# Patient Record
Sex: Female | Born: 1945 | Race: Black or African American | Hispanic: No | Marital: Married | Smoking: Never smoker
Health system: Southern US, Community
[De-identification: ages and names within clinical notes are randomized; demographics above are authoritative.]

## PROBLEM LIST (undated history)

## (undated) ENCOUNTER — Emergency Department (HOSPITAL_COMMUNITY): Admission: EM | Payer: Medicare Other | Source: Home / Self Care

## (undated) DIAGNOSIS — I10 Essential (primary) hypertension: Secondary | ICD-10-CM

## (undated) DIAGNOSIS — I2510 Atherosclerotic heart disease of native coronary artery without angina pectoris: Secondary | ICD-10-CM

## (undated) DIAGNOSIS — E7800 Pure hypercholesterolemia, unspecified: Secondary | ICD-10-CM

## (undated) DIAGNOSIS — E785 Hyperlipidemia, unspecified: Secondary | ICD-10-CM

## (undated) DIAGNOSIS — K56609 Unspecified intestinal obstruction, unspecified as to partial versus complete obstruction: Secondary | ICD-10-CM

## (undated) HISTORY — PX: ROTATOR CUFF REPAIR: SHX139

## (undated) HISTORY — DX: Atherosclerotic heart disease of native coronary artery without angina pectoris: I25.10

## (undated) HISTORY — PX: TRIGGER FINGER RELEASE: SHX641

## (undated) HISTORY — PX: APPENDECTOMY: SHX54

## (undated) HISTORY — DX: Essential (primary) hypertension: I10

## (undated) HISTORY — DX: Hyperlipidemia, unspecified: E78.5

---

## 1998-01-11 ENCOUNTER — Other Ambulatory Visit: Admission: RE | Admit: 1998-01-11 | Discharge: 1998-01-11 | Payer: Self-pay | Admitting: Obstetrics and Gynecology

## 1998-12-07 ENCOUNTER — Other Ambulatory Visit: Admission: RE | Admit: 1998-12-07 | Discharge: 1998-12-07 | Payer: Self-pay | Admitting: General Surgery

## 1998-12-13 ENCOUNTER — Ambulatory Visit (HOSPITAL_BASED_OUTPATIENT_CLINIC_OR_DEPARTMENT_OTHER): Admission: RE | Admit: 1998-12-13 | Discharge: 1998-12-13 | Payer: Self-pay | Admitting: General Surgery

## 1999-06-30 ENCOUNTER — Encounter: Payer: Self-pay | Admitting: Family Medicine

## 1999-06-30 ENCOUNTER — Encounter: Admission: RE | Admit: 1999-06-30 | Discharge: 1999-06-30 | Payer: Self-pay | Admitting: Family Medicine

## 2000-07-12 ENCOUNTER — Encounter: Admission: RE | Admit: 2000-07-12 | Discharge: 2000-07-12 | Payer: Self-pay | Admitting: Family Medicine

## 2000-07-12 ENCOUNTER — Encounter: Payer: Self-pay | Admitting: Family Medicine

## 2001-08-22 ENCOUNTER — Encounter: Admission: RE | Admit: 2001-08-22 | Discharge: 2001-08-22 | Payer: Self-pay | Admitting: Family Medicine

## 2001-08-22 ENCOUNTER — Encounter: Payer: Self-pay | Admitting: Family Medicine

## 2002-03-13 ENCOUNTER — Ambulatory Visit (HOSPITAL_COMMUNITY): Admission: RE | Admit: 2002-03-13 | Discharge: 2002-03-13 | Payer: Self-pay | Admitting: Gastroenterology

## 2002-08-28 ENCOUNTER — Encounter: Admission: RE | Admit: 2002-08-28 | Discharge: 2002-08-28 | Payer: Self-pay | Admitting: Family Medicine

## 2002-08-28 ENCOUNTER — Encounter: Payer: Self-pay | Admitting: Family Medicine

## 2003-02-16 ENCOUNTER — Other Ambulatory Visit: Admission: RE | Admit: 2003-02-16 | Discharge: 2003-02-16 | Payer: Self-pay | Admitting: Obstetrics and Gynecology

## 2003-09-24 ENCOUNTER — Encounter: Admission: RE | Admit: 2003-09-24 | Discharge: 2003-09-24 | Payer: Self-pay | Admitting: Family Medicine

## 2004-03-10 ENCOUNTER — Encounter: Admission: RE | Admit: 2004-03-10 | Discharge: 2004-03-10 | Payer: Self-pay | Admitting: Obstetrics and Gynecology

## 2004-06-07 ENCOUNTER — Observation Stay (HOSPITAL_COMMUNITY): Admission: RE | Admit: 2004-06-07 | Discharge: 2004-06-08 | Payer: Self-pay | Admitting: Specialist

## 2004-09-29 ENCOUNTER — Encounter: Admission: RE | Admit: 2004-09-29 | Discharge: 2004-09-29 | Payer: Self-pay | Admitting: Obstetrics and Gynecology

## 2005-10-05 ENCOUNTER — Encounter: Admission: RE | Admit: 2005-10-05 | Discharge: 2005-10-05 | Payer: Self-pay | Admitting: Family Medicine

## 2006-10-08 ENCOUNTER — Encounter: Admission: RE | Admit: 2006-10-08 | Discharge: 2006-10-08 | Payer: Self-pay | Admitting: Family Medicine

## 2006-10-10 ENCOUNTER — Encounter: Admission: RE | Admit: 2006-10-10 | Discharge: 2006-10-10 | Payer: Self-pay | Admitting: Family Medicine

## 2007-10-16 ENCOUNTER — Encounter: Admission: RE | Admit: 2007-10-16 | Discharge: 2007-10-16 | Payer: Self-pay | Admitting: Family Medicine

## 2008-04-18 ENCOUNTER — Emergency Department (HOSPITAL_COMMUNITY): Admission: EM | Admit: 2008-04-18 | Discharge: 2008-04-19 | Payer: Self-pay | Admitting: Emergency Medicine

## 2008-10-18 ENCOUNTER — Encounter: Admission: RE | Admit: 2008-10-18 | Discharge: 2008-10-18 | Payer: Self-pay | Admitting: Family Medicine

## 2009-07-01 ENCOUNTER — Ambulatory Visit (HOSPITAL_COMMUNITY): Admission: RE | Admit: 2009-07-01 | Discharge: 2009-07-02 | Payer: Self-pay | Admitting: Specialist

## 2009-09-19 ENCOUNTER — Ambulatory Visit (HOSPITAL_COMMUNITY): Admission: RE | Admit: 2009-09-19 | Discharge: 2009-09-20 | Payer: Self-pay | Admitting: Specialist

## 2009-11-24 ENCOUNTER — Encounter: Admission: RE | Admit: 2009-11-24 | Discharge: 2009-11-24 | Payer: Self-pay | Admitting: Family Medicine

## 2010-09-03 LAB — BASIC METABOLIC PANEL
BUN: 10 mg/dL (ref 6–23)
Chloride: 107 mEq/L (ref 96–112)
Creatinine, Ser: 0.6 mg/dL (ref 0.4–1.2)
GFR calc Af Amer: >60 mL/min (ref >60)
GFR calc non Af Amer: >60 mL/min (ref >60)
Glucose, Bld: 84 mg/dL (ref 70–99)
Potassium: 4.7 mEq/L (ref 3.5–5.1)
Sodium: 143 mEq/L (ref 135–145)

## 2010-09-03 LAB — NO BLOOD PRODUCTS

## 2010-09-03 LAB — CBC
HCT: 40.2 % (ref 36.0–46.0)
Platelets: 294 10*3/uL (ref 150–400)

## 2010-09-06 LAB — BASIC METABOLIC PANEL
Calcium: 9.8 mg/dL (ref 8.4–10.5)
Chloride: 107 mEq/L (ref 96–112)
Creatinine, Ser: 0.74 mg/dL (ref 0.4–1.2)
GFR calc Af Amer: >60 mL/min (ref >60)
GFR calc non Af Amer: >60 mL/min (ref >60)
Sodium: 143 mEq/L (ref 135–145)

## 2010-09-06 LAB — CBC
HCT: 40.2 % (ref 36.0–46.0)
Hemoglobin: 13.1 g/dL (ref 12.0–15.0)
MCHC: 32.5 g/dL (ref 30.0–36.0)
RBC: 4.28 MIL/uL (ref 3.87–5.11)
RDW: 12.8 % (ref 11.5–15.5)

## 2010-11-03 NOTE — Op Note (Signed)
NAMELASYA, VETTER             ACCOUNT NO.:  [id number]   MEDICAL RECORD NO.:  [id number]          PATIENT TYPE:  AMB   LOCATION:  DAY                          FACILITY:  [facility]   PHYSICIAN:  Jene Every, M.D.    DATE OF BIRTH:  August 13, 1945   DATE OF PROCEDURE:  06/07/2004  DATE OF DISCHARGE:                                 OPERATIVE REPORT   PREOPERATIVE DIAGNOSES:  Rotator cuff tear, impingement syndrome, left  shoulder.   POSTOPERATIVE DIAGNOSES:  Rotator cuff tear, impingement syndrome, left  shoulder.   PROCEDURE:  Open subacromial decompression, acromioplasty, bursectomy,  rotator cuff repair utilizing Mitek suture anchors and parachute anchor.   ANESTHESIA:  General.   ASSISTANT:  Roma Schanz, P.A.   BRIEF HISTORY:  A 65 year old with refractory shoulder pain, MRI indicating  full thickness and proximal traction of the rotator cuff.  Operative  intervention was indicated for open repair. The risks and benefits were  discussed including bleeding, infection, injury to neurovascular structures,  suboptimal range of motion, recurrent tear, need for postoperative  mobilization, etc.   TECHNIQUE:  The patient supine in beach chair position after an adequate  level of general anesthesia and 1 g of Kefzol, the left shoulder and upper  extremity was prepped and draped in the usual sterile fashion.  An incision  was made over the anterior aspect of the acromion and Langer's line about 2  cm in length. The subcutaneous tissue was dissected, electrocautery was  utilized to achieve hemostasis.  The raphe between the anterior and lateral  heads of the deltoid was identified and divided and subperiosteally elevated  from the anterior aspect of the acromion. The high speed bur was then  utilized to perform an acromioplasty of the anterior edge with protection of  the deltoid at all times.  CA ligament resection and bursectomy was  performed.  There was a tear more prominent  than noted on the MRI with a  full-thickness tear of the rotator cuff with retraction over approximately a  centimeter.  I diverted the edges, there was some complex tearing in the  cement portion.  I digitally mobilized the subacromial space performing a  bursectomy and lysed all adhesions. I then mobilized the cuff and it was  found adequately mobilized to the greater tuberosity without difficulty.  With a rongeur, I performed a cancellous bed and prepared the cancellous  bed. I then took two Mitek suture anchors and impacted near the greater  tuberosity and excellent bone with no pullout and then threaded them through  the rotator cuff through a good portion of the cuff.  I advanced the cuff  and I held the cuff down with an awl while I tied the suture over that  securing the cuff.  To deliver the cuff into the trough, I then utilized a  parachute anchor, tapping it into the bone near the greater tuberosity and  advancing it with excellent purchase and delivery of the rotator cuff into  the trough.  Good range without retraction on the rotator cuff. Full  coverage was noted, excellent repair was appreciated. The  wound was  copiously irrigated with antibiotic irrigation. I then repaired the raphe  with #1 Vicryl and interrupted figure-of-eight sutures. Excellent purchase  was obtained. We did not detach the deltoid.  Subcutaneous tissue  reapproximated with 2-0 Vicryl simple sutures, the skin was reapproximated  with 4-0 subcuticular Prolene. Steri-Strips were applied, a sterile dressing  was applied. She was  placed in an abduction pillow, extubated without difficulty and transported  to the recovery room in satisfactory condition.   The patient tolerated the procedure well with no complications.     Trey Paula   JB/MEDQ  D:  06/07/2004  T:  06/07/2004  Job:  [id number]

## 2010-11-03 NOTE — Op Note (Signed)
   [name], Kelly Harrington                         ACCOUNT NO.:  [id number]   MEDICAL RECORD NO.:  [id number]                   PATIENT TYPE:  AMB   LOCATION:  ENDO                                 FACILITY:  [facility]   PHYSICIAN:  John C. Madilyn Fireman, M.D.                 DATE OF BIRTH:  1945/09/28   DATE OF PROCEDURE:  03/13/2002  DATE OF DISCHARGE:                                 OPERATIVE REPORT   PROCEDURE:  Colonoscopy.   INDICATION FOR PROCEDURE:  Screening colonoscopy in a 65 year old patient  with no prior colon screening.   DESCRIPTION OF PROCEDURE:  The patient was placed in the left lateral  decubitus position and placed on the pulse monitor with continuous low-flow  oxygen delivered by nasal cannula.  She was sedated with 50 mg IV Demerol  and 7 mg IV Versed.  The Olympus video colonoscope was inserted into the  rectum and advanced to the cecum, confirmed by transillumination at  McBurney's point and visualization of the ileocecal valve and appendiceal  orifice.  The prep was excellent.  The cecum, ascending, transverse,  descending, and sigmoid colon all appeared normal with no masses, polyps,  diverticula, or other mucosal abnormalities.  The rectum likewise appeared  normal, and retroflexed view of the anus revealed no obvious internal  hemorrhoids.  The colonoscope was then withdrawn, and the patient returned  to the recovery room in stable condition.  She tolerated the procedure well,  and there were no immediate complications.   IMPRESSION:  Normal colonoscopy.   PLAN:  Flexible sigmoidoscopy in five years and consideration of repeat  colonoscopy in 10 years.                                               John C. Madilyn Fireman, M.D.    JCH/MEDQ  D:  03/13/2002  T:  03/13/2002  Job:  [id number]   cc:   Raynelle Dick, M.D.  [address]  [place]  [place] [address]  Fax: [phone]

## 2011-03-20 LAB — URINALYSIS, ROUTINE W REFLEX MICROSCOPIC
Hgb urine dipstick: NEGATIVE
Ketones, ur: NEGATIVE
Leukocytes, UA: NEGATIVE
Nitrite: NEGATIVE
Protein, ur: 30 — AB
Urobilinogen, UA: 0.2
pH: 8

## 2011-03-20 LAB — POCT I-STAT, CHEM 8
BUN: 6
HCT: 43
Hemoglobin: 14.6

## 2011-03-20 LAB — URINE MICROSCOPIC-ADD ON

## 2012-12-03 DIAGNOSIS — J329 Chronic sinusitis, unspecified: Secondary | ICD-10-CM | POA: Diagnosis not present

## 2012-12-18 DIAGNOSIS — Z1231 Encounter for screening mammogram for malignant neoplasm of breast: Secondary | ICD-10-CM | POA: Diagnosis not present

## 2012-12-25 DIAGNOSIS — E7800 Pure hypercholesterolemia, unspecified: Secondary | ICD-10-CM | POA: Diagnosis not present

## 2012-12-25 DIAGNOSIS — Z0000 Encounter for general adult medical examination without abnormal findings: Secondary | ICD-10-CM | POA: Diagnosis not present

## 2012-12-25 DIAGNOSIS — J309 Allergic rhinitis, unspecified: Secondary | ICD-10-CM | POA: Diagnosis not present

## 2012-12-25 DIAGNOSIS — Z1211 Encounter for screening for malignant neoplasm of colon: Secondary | ICD-10-CM | POA: Diagnosis not present

## 2012-12-25 DIAGNOSIS — Z23 Encounter for immunization: Secondary | ICD-10-CM | POA: Diagnosis not present

## 2012-12-25 DIAGNOSIS — Z79899 Other long term (current) drug therapy: Secondary | ICD-10-CM | POA: Diagnosis not present

## 2012-12-25 DIAGNOSIS — I10 Essential (primary) hypertension: Secondary | ICD-10-CM | POA: Diagnosis not present

## 2013-02-02 DIAGNOSIS — M6530 Trigger finger, unspecified finger: Secondary | ICD-10-CM | POA: Diagnosis not present

## 2013-03-02 DIAGNOSIS — M6530 Trigger finger, unspecified finger: Secondary | ICD-10-CM | POA: Diagnosis not present

## 2013-03-13 DIAGNOSIS — Z1211 Encounter for screening for malignant neoplasm of colon: Secondary | ICD-10-CM | POA: Diagnosis not present

## 2013-06-09 DIAGNOSIS — M24549 Contracture, unspecified hand: Secondary | ICD-10-CM | POA: Diagnosis not present

## 2013-06-09 DIAGNOSIS — M6530 Trigger finger, unspecified finger: Secondary | ICD-10-CM | POA: Diagnosis not present

## 2013-06-22 DIAGNOSIS — M6530 Trigger finger, unspecified finger: Secondary | ICD-10-CM | POA: Diagnosis not present

## 2013-06-30 DIAGNOSIS — I10 Essential (primary) hypertension: Secondary | ICD-10-CM | POA: Diagnosis not present

## 2013-06-30 DIAGNOSIS — M79609 Pain in unspecified limb: Secondary | ICD-10-CM | POA: Diagnosis not present

## 2013-06-30 DIAGNOSIS — E7800 Pure hypercholesterolemia, unspecified: Secondary | ICD-10-CM | POA: Diagnosis not present

## 2013-06-30 DIAGNOSIS — Z79899 Other long term (current) drug therapy: Secondary | ICD-10-CM | POA: Diagnosis not present

## 2013-07-09 DIAGNOSIS — M6530 Trigger finger, unspecified finger: Secondary | ICD-10-CM | POA: Diagnosis not present

## 2013-07-20 DIAGNOSIS — M6530 Trigger finger, unspecified finger: Secondary | ICD-10-CM | POA: Diagnosis not present

## 2013-07-23 DIAGNOSIS — M6530 Trigger finger, unspecified finger: Secondary | ICD-10-CM | POA: Diagnosis not present

## 2013-12-21 DIAGNOSIS — Z1231 Encounter for screening mammogram for malignant neoplasm of breast: Secondary | ICD-10-CM | POA: Diagnosis not present

## 2014-01-07 DIAGNOSIS — E7800 Pure hypercholesterolemia, unspecified: Secondary | ICD-10-CM | POA: Diagnosis not present

## 2014-01-07 DIAGNOSIS — I10 Essential (primary) hypertension: Secondary | ICD-10-CM | POA: Diagnosis not present

## 2014-01-07 DIAGNOSIS — M79609 Pain in unspecified limb: Secondary | ICD-10-CM | POA: Diagnosis not present

## 2014-02-25 DIAGNOSIS — Z01419 Encounter for gynecological examination (general) (routine) without abnormal findings: Secondary | ICD-10-CM | POA: Diagnosis not present

## 2014-02-25 DIAGNOSIS — Z124 Encounter for screening for malignant neoplasm of cervix: Secondary | ICD-10-CM | POA: Diagnosis not present

## 2014-05-19 DIAGNOSIS — M65342 Trigger finger, left ring finger: Secondary | ICD-10-CM | POA: Diagnosis not present

## 2014-11-25 DIAGNOSIS — L509 Urticaria, unspecified: Secondary | ICD-10-CM | POA: Diagnosis not present

## 2014-12-23 DIAGNOSIS — Z1231 Encounter for screening mammogram for malignant neoplasm of breast: Secondary | ICD-10-CM | POA: Diagnosis not present

## 2014-12-31 DIAGNOSIS — L309 Dermatitis, unspecified: Secondary | ICD-10-CM | POA: Diagnosis not present

## 2014-12-31 DIAGNOSIS — L503 Dermatographic urticaria: Secondary | ICD-10-CM | POA: Diagnosis not present

## 2014-12-31 DIAGNOSIS — L810 Postinflammatory hyperpigmentation: Secondary | ICD-10-CM | POA: Diagnosis not present

## 2015-01-20 DIAGNOSIS — Z0000 Encounter for general adult medical examination without abnormal findings: Secondary | ICD-10-CM | POA: Diagnosis not present

## 2015-01-20 DIAGNOSIS — E780 Pure hypercholesterolemia: Secondary | ICD-10-CM | POA: Diagnosis not present

## 2015-01-20 DIAGNOSIS — I10 Essential (primary) hypertension: Secondary | ICD-10-CM | POA: Diagnosis not present

## 2015-01-20 DIAGNOSIS — R002 Palpitations: Secondary | ICD-10-CM | POA: Diagnosis not present

## 2015-01-20 DIAGNOSIS — L509 Urticaria, unspecified: Secondary | ICD-10-CM | POA: Diagnosis not present

## 2015-03-03 DIAGNOSIS — L503 Dermatographic urticaria: Secondary | ICD-10-CM | POA: Diagnosis not present

## 2015-03-03 DIAGNOSIS — L309 Dermatitis, unspecified: Secondary | ICD-10-CM | POA: Diagnosis not present

## 2017-04-17 ENCOUNTER — Other Ambulatory Visit: Payer: Self-pay | Admitting: Family Medicine

## 2017-04-17 ENCOUNTER — Other Ambulatory Visit (HOSPITAL_COMMUNITY)
Admission: RE | Admit: 2017-04-17 | Discharge: 2017-04-17 | Disposition: A | Discharge status: Home / Self Care | Payer: Medicare Other | Source: Ambulatory Visit | Attending: Family Medicine | Admitting: Family Medicine

## 2017-04-17 DIAGNOSIS — Z124 Encounter for screening for malignant neoplasm of cervix: Secondary | ICD-10-CM | POA: Diagnosis present

## 2017-04-19 LAB — CYTOLOGY - PAP: Diagnosis: NEGATIVE

## 2017-06-27 ENCOUNTER — Other Ambulatory Visit: Payer: Self-pay

## 2017-06-27 ENCOUNTER — Encounter (HOSPITAL_BASED_OUTPATIENT_CLINIC_OR_DEPARTMENT_OTHER): Payer: Self-pay | Admitting: *Deleted

## 2017-06-27 ENCOUNTER — Emergency Department (HOSPITAL_BASED_OUTPATIENT_CLINIC_OR_DEPARTMENT_OTHER)
Admission: EM | Admit: 2017-06-27 | Discharge: 2017-06-27 | Disposition: A | Discharge status: Home / Self Care | Payer: Medicare Other | Attending: Emergency Medicine | Admitting: Emergency Medicine

## 2017-06-27 DIAGNOSIS — H3321 Serous retinal detachment, right eye: Secondary | ICD-10-CM | POA: Insufficient documentation

## 2017-06-27 DIAGNOSIS — H5789 Other specified disorders of eye and adnexa: Secondary | ICD-10-CM | POA: Diagnosis present

## 2017-06-27 DIAGNOSIS — Z79899 Other long term (current) drug therapy: Secondary | ICD-10-CM | POA: Diagnosis not present

## 2017-06-27 HISTORY — DX: Pure hypercholesterolemia, unspecified: E78.00

## 2017-06-27 NOTE — ED Triage Notes (Signed)
Yesterday she started having floater in her right eye. Today it is in both eyes.

## 2017-06-27 NOTE — ED Notes (Signed)
Pt discharged to home with family. NAD.  

## 2017-06-27 NOTE — Discharge Instructions (Signed)
Follow-up with the ophthalmologist in his office at 9 AM.  Concern is for possible retinal detachment.

## 2017-06-27 NOTE — ED Provider Notes (Signed)
[facility] DEPARTMENT Provider Note   CSN: [id number] Arrival date & time: 06/27/17  2001     History   Chief Complaint No chief complaint on file.   HPI Kelly Harrington is a 72 y.o. female.  Patient yesterday started to have a floater visual sensation in her right eye.  Today she got a feeling of a single line of broken glass.  Patient does wear glasses does not wear contacts.  Patient does have an optometrist but not an ophthalmologist.  Triage said today it is in both eyes but patient states is just right eye.  Patient has a history of high cholesterol does not have a history of hypertension but blood pressures have been elevated today patient does not have a history of diabetes.  Patient has not had any significant problems with arise in the past.  No eye pain.  No significant visual changes      Past Medical History:  Diagnosis Date  . High cholesterol     There are no active problems to display for this patient.   Past Surgical History:  Procedure Laterality Date  . ROTATOR CUFF REPAIR    . TRIGGER FINGER RELEASE      OB History    No data available       Home Medications    Prior to Admission medications   Medication Sig Start Date End Date Taking? Authorizing Provider  atorvastatin (LIPITOR) 40 MG tablet Take 40 mg by mouth daily.   Yes [provider]    Family History No family history on file.  Social History Social History   Tobacco Use  . Smoking status: Never Smoker  . Smokeless tobacco: Never Used  Substance Use Topics  . Alcohol use: No    Frequency: Never  . Drug use: No     Allergies   Patient has no allergy information on record.   Review of Systems Review of Systems  Constitutional: Negative for fever.  HENT: Negative for congestion.   Eyes: Negative for photophobia, pain, discharge and redness.  Respiratory: Negative for shortness of breath.   Cardiovascular: Negative for chest pain.    Gastrointestinal: Negative for abdominal pain.  Genitourinary: Negative for dysuria.  Musculoskeletal: Negative for back pain.  Skin: Negative for rash.  Neurological: Negative for headaches.  Hematological: Does not bruise/bleed easily.  Psychiatric/Behavioral: Negative for confusion.     Physical Exam Updated Vital Signs BP (!) 174/93 (BP Location: Left Arm) Comment: Simultaneous filing. User may not have seen previous data.  Pulse (!) 104 Comment: Simultaneous filing. User may not have seen previous data.  Temp 98.6 F (37 C) (Oral)   Resp 20 Comment: Simultaneous filing. User may not have seen previous data.  Ht 1.549 m (5\' 1" )   Wt 50.3 kg (111 lb)   SpO2 99% Comment: Simultaneous filing. User may not have seen previous data.  BMI 20.97 kg/m   Physical Exam  Constitutional: She is oriented to person, place, and time. She appears well-developed and well-nourished. No distress.  HENT:  Head: Normocephalic and atraumatic.  Mouth/Throat: Oropharynx is clear and moist.  Eyes: Conjunctivae and EOM are normal. Pupils are equal, round, and reactive to light. Right eye exhibits no discharge. Left eye exhibits no discharge. No scleral icterus.  Anterior chamber normal in both eyes.  Visual fields normal in comparison to the left eye and the right eye.  What part of the retina can be visualized appears normal.  But limited.  Neck: Normal range of motion. Neck supple.  Cardiovascular: Normal rate, regular rhythm and normal heart sounds.  Pulmonary/Chest: Effort normal and breath sounds normal.  Abdominal: Soft. Bowel sounds are normal. There is no tenderness.  Musculoskeletal: Normal range of motion. She exhibits no edema.  Neurological: She is alert and oriented to person, place, and time. No cranial nerve deficit or sensory deficit. She exhibits normal muscle tone. Coordination normal.  Skin: Skin is warm. No rash noted.  Nursing note and vitals reviewed.    ED Treatments /  Results  Labs (all labs ordered are listed, but only abnormal results are displayed) Labs Reviewed - No data to display  EKG  EKG Interpretation None       Radiology No results found.  Procedures Procedures (including critical care time)  Medications Ordered in ED Medications - No data to display   Initial Impression / Assessment and Plan / ED Course  I have reviewed the triage vital signs and the nursing notes.  Pertinent labs & imaging results that were available during my care of the patient were reviewed by me and considered in my medical decision making (see chart for details).    Symptoms concerning for possible retinal detachment.  Or vitreous detachment.  Discussed with Dr. Carolynn Sayers ophthalmology.  He will see her in the office at 9 in the morning.  Patient's blood pressure is elevated she will need follow-up for that.  Patient has not had a history of hypertension in the past.  Patient nontoxic no acute distress.  Eye exam without any significant findings.  But limited.   Final Clinical Impressions(s) / ED Diagnoses   Final diagnoses:  Right retinal detachment    ED Discharge Orders    None       Fredia Sorrow, MD 06/27/17 2311

## 2018-08-14 ENCOUNTER — Ambulatory Visit: Payer: Medicare Other | Admitting: Podiatry

## 2018-08-14 ENCOUNTER — Encounter: Payer: Self-pay | Admitting: Podiatry

## 2018-08-14 VITALS — BP 140/88 | HR 69 | Temp 99.4°F | Resp 14

## 2018-08-14 DIAGNOSIS — B351 Tinea unguium: Secondary | ICD-10-CM | POA: Diagnosis not present

## 2018-08-14 DIAGNOSIS — L603 Nail dystrophy: Secondary | ICD-10-CM

## 2018-08-14 NOTE — Progress Notes (Signed)
   Subjective:    Patient ID: Kelly Harrington, female    DOB: 08/12/1945, 73 y.o.   MRN: [id number]  HPI 73 year old female presents the office today for concerns of toenail issue.  She states that her left third and right third toenails are starting to crack it looks like they can come off.  She states that her big toenails have become previously.  She denies any pain in the nails and she denies any redness or drainage or any swelling and no pain of the nails.  She said that she does workout a lot she is not sure if that is contributing to the symptoms.  She said no recent treatment.  No other concerns.   Review of Systems  All other systems reviewed and are negative.  Past Medical History:  Diagnosis Date  . High cholesterol     Past Surgical History:  Procedure Laterality Date  . ROTATOR CUFF REPAIR    . TRIGGER FINGER RELEASE       Current Outpatient Medications:  .  atorvastatin (LIPITOR) 40 MG tablet, Take 40 mg by mouth daily., Disp: , Rfl:  .  atorvastatin (LIPITOR) 40 MG tablet, atorvastatin 40 mg tablet   1 tablet 3 times a week by oral route., Disp: , Rfl:  .  ibuprofen (ADVIL,MOTRIN) 200 MG tablet, ibuprofen 200 mg tablet  Take 1 tablet every 6 hours by oral route., Disp: , Rfl:   Allergies  Allergen Reactions  . Codeine          Objective:   Physical Exam  General: AAO x3, NAD  Dermatological: Overall the toenails appear to be mildly hypertrophic, dystrophic with yellow and brown discoloration of the nails.  Bilateral third toenails are cracking towards the base of the nails but they are firmly here to the nail beds.  There is some dystrophy and discoloration with ridging in the right hallux toenail as well.  There is no edema, erythema, increased warmth and there is no signs of infection.  No open lesions.  Vascular: Dorsalis Pedis artery and Posterior Tibial artery pedal pulses are 2/4 bilateral with immedate capillary fill time. There is no pain with calf  compression, swelling, warmth, erythema.   Neruologic: Grossly intact via light touch bilateral.  Protective threshold with Semmes Wienstein monofilament intact to all pedal sites bilateral.   Musculoskeletal: No gross boney pedal deformities bilateral. No pain, crepitus, or limitation noted with foot and ankle range of motion bilateral. Muscular strength 5/5 in all groups tested bilateral.  Gait: Unassisted, Nonantalgic.      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  For now we discussed a biotin supplement as well as tea tree oil to the nails.  We discussed treatment options in case of fungus will likely do a topical compound through [facility] if it comes back positive for fungus.  Trula Slade DPM

## 2018-08-14 NOTE — Patient Instructions (Signed)
Look at starting a biotin supplement to help with the toenails. Also you can apply tea tree oil for now. I will call you once I get the results back on the culture. If you don't hear from [place] in about 2 weeks, please let me know.   If was nice to meet you today. If you have any questions or any further concerns, please feel fee to give me a call. You can call our office at [phone] or please feel fee to send me a message through [place].

## 2018-08-14 NOTE — Addendum Note (Signed)
Addended by: [place] Mon R on: 08/14/2018 12:55 PM   Modules accepted: Orders

## 2018-08-27 ENCOUNTER — Telehealth: Payer: Self-pay | Admitting: *Deleted

## 2018-08-27 NOTE — Telephone Encounter (Signed)
-----   Message from Trula Slade, DPM sent at 08/27/2018 12:31 PM EDT ----- Val- please let her know that the culture did not show fungus. I would continue with biotin supplement, and we can use urea cream. If still concern for fungus we can do the topical through [facility] that has antifungal and urea in it. Thanks.

## 2018-08-27 NOTE — Telephone Encounter (Signed)
I informed pt of Dr. Leigh Aurora review of results and orders, and explained the Revitaderm40 cost $22.00, and daily use to soften toenails for easier trimming, filling and thinning for a more normal appearance. Pt states she would like the Revitaderm40.

## 2019-08-29 ENCOUNTER — Ambulatory Visit: Payer: Medicare Other | Attending: Internal Medicine

## 2019-08-29 DIAGNOSIS — Z23 Encounter for immunization: Secondary | ICD-10-CM

## 2019-08-29 NOTE — Progress Notes (Signed)
   Covid-19 Vaccination Clinic  Name:  Kelly Harrington    MRN: [id number] DOB: 03/22/1946  08/29/2019  Ms. Kelly Harrington was observed post Covid-19 immunization for 15 minutes without incident. She was provided with Vaccine Information Sheet and instruction to access the V-Safe system.   Ms. Kelly Harrington was instructed to call 911 with any severe reactions post vaccine: Marland Kitchen Difficulty breathing  . Swelling of face and throat  . A fast heartbeat  . A bad rash all over body  . Dizziness and weakness   Immunizations Administered    Name Date Dose VIS Date Route   Pfizer COVID-19 Vaccine 08/29/2019  1:50 PM 0.3 mL 05/29/2019 Intramuscular   Manufacturer: [place]   Lot: [id number]   [place]: [id number]

## 2019-08-31 ENCOUNTER — Ambulatory Visit: Payer: Medicare Other

## 2019-09-22 ENCOUNTER — Ambulatory Visit: Payer: Medicare Other | Attending: Internal Medicine

## 2019-09-22 DIAGNOSIS — Z23 Encounter for immunization: Secondary | ICD-10-CM

## 2019-09-22 NOTE — Progress Notes (Signed)
   Covid-19 Vaccination Clinic  Name:  Kelly Harrington    MRN: [id number] DOB: July 01, 1945  09/22/2019  Kelly Harrington was observed post Covid-19 immunization for 15 minutes without incident. She was provided with Vaccine Information Sheet and instruction to access the V-Safe system.   Kelly Harrington was instructed to call 911 with any severe reactions post vaccine: Marland Kitchen Difficulty breathing  . Swelling of face and throat  . A fast heartbeat  . A bad rash all over body  . Dizziness and weakness   Immunizations Administered    Name Date Dose VIS Date Route   Pfizer COVID-19 Vaccine 09/22/2019  1:14 PM 0.3 mL 05/29/2019 Intramuscular   Manufacturer: [place]   Lot: [id number]   [place]: [id number]

## 2019-10-05 ENCOUNTER — Emergency Department (HOSPITAL_COMMUNITY): Payer: Medicare Other

## 2019-10-05 ENCOUNTER — Encounter (HOSPITAL_COMMUNITY): Payer: Self-pay

## 2019-10-05 ENCOUNTER — Other Ambulatory Visit: Payer: Self-pay

## 2019-10-05 ENCOUNTER — Observation Stay (HOSPITAL_COMMUNITY)
Admission: EM | Admit: 2019-10-05 | Discharge: 2019-10-08 | Disposition: A | Discharge status: Home / Self Care | Payer: Medicare Other | Attending: General Surgery | Admitting: General Surgery

## 2019-10-05 DIAGNOSIS — E7800 Pure hypercholesterolemia, unspecified: Secondary | ICD-10-CM | POA: Insufficient documentation

## 2019-10-05 DIAGNOSIS — E785 Hyperlipidemia, unspecified: Secondary | ICD-10-CM | POA: Diagnosis not present

## 2019-10-05 DIAGNOSIS — Z79899 Other long term (current) drug therapy: Secondary | ICD-10-CM | POA: Insufficient documentation

## 2019-10-05 DIAGNOSIS — Z20822 Contact with and (suspected) exposure to covid-19: Secondary | ICD-10-CM | POA: Insufficient documentation

## 2019-10-05 DIAGNOSIS — C181 Malignant neoplasm of appendix: Secondary | ICD-10-CM | POA: Diagnosis not present

## 2019-10-05 DIAGNOSIS — K3580 Unspecified acute appendicitis: Secondary | ICD-10-CM | POA: Diagnosis present

## 2019-10-05 LAB — URINALYSIS, ROUTINE W REFLEX MICROSCOPIC
Bilirubin Urine: NEGATIVE
Glucose, UA: NEGATIVE mg/dL
Hgb urine dipstick: NEGATIVE
Ketones, ur: 20 mg/dL — AB
Leukocytes,Ua: NEGATIVE
Nitrite: NEGATIVE
Protein, ur: 100 mg/dL — AB
Specific Gravity, Urine: 1.028 (ref 1.005–1.030)
pH: 5 (ref 5.0–8.0)

## 2019-10-05 LAB — CBC
HCT: 40.8 % (ref 36.0–46.0)
Hemoglobin: 13.1 g/dL (ref 12.0–15.0)
MCH: 30.3 pg (ref 26.0–34.0)
MCHC: 32.1 g/dL (ref 30.0–36.0)
MCV: 94.4 fL (ref 80.0–100.0)
Platelets: 301 10*3/uL (ref 150–400)
RBC: 4.32 MIL/uL (ref 3.87–5.11)
RDW: 12.6 % (ref 11.5–15.5)
WBC: 8.6 10*3/uL (ref 4.0–10.5)
nRBC: 0 % (ref 0.0–0.2)

## 2019-10-05 LAB — COMPREHENSIVE METABOLIC PANEL
ALT: 21 U/L (ref 0–44)
AST: 29 U/L (ref 15–41)
Albumin: 4.7 g/dL (ref 3.5–5.0)
Alkaline Phosphatase: 40 U/L (ref 38–126)
Anion gap: 13 (ref 5–15)
BUN: 10 mg/dL (ref 8–23)
CO2: 24 mmol/L (ref 22–32)
Calcium: 9.5 mg/dL (ref 8.9–10.3)
Chloride: 99 mmol/L (ref 98–111)
Creatinine, Ser: 0.64 mg/dL (ref 0.44–1.00)
GFR calc Af Amer: >60 mL/min (ref >60)
GFR calc non Af Amer: >60 mL/min (ref >60)
Glucose, Bld: 135 mg/dL — ABNORMAL HIGH (ref 70–99)
Potassium: 3.9 mmol/L (ref 3.5–5.1)
Sodium: 136 mmol/L (ref 135–145)
Total Bilirubin: 1.5 mg/dL — ABNORMAL HIGH (ref 0.3–1.2)
Total Protein: 8.3 g/dL — ABNORMAL HIGH (ref 6.5–8.1)

## 2019-10-05 LAB — LIPASE, BLOOD: Lipase: 19 U/L (ref 11–51)

## 2019-10-05 MED ORDER — IOHEXOL 300 MG/ML  SOLN
100.0000 mL | Freq: Once | INTRAMUSCULAR | Status: AC | PRN
  Administered 2019-10-05: 23:00:00 100 mL via INTRAVENOUS

## 2019-10-05 MED ORDER — SODIUM CHLORIDE 0.9 % IV BOLUS (SEPSIS)
1000.0000 mL | Freq: Once | INTRAVENOUS | Status: AC
  Administered 2019-10-05: 22:00:00 1000 mL via INTRAVENOUS

## 2019-10-05 MED ORDER — PIPERACILLIN-TAZOBACTAM 3.375 G IVPB 30 MIN
3.3750 g | Freq: Once | INTRAVENOUS | Status: AC
  Administered 2019-10-05: 3.375 g via INTRAVENOUS
  Filled 2019-10-05: qty 50

## 2019-10-05 MED ORDER — ONDANSETRON HCL 4 MG/2ML IJ SOLN
4.0000 mg | Freq: Once | INTRAMUSCULAR | Status: AC
  Administered 2019-10-05: 22:00:00 4 mg via INTRAVENOUS
  Filled 2019-10-05: qty 2

## 2019-10-05 MED ORDER — HYDROMORPHONE HCL 1 MG/ML IJ SOLN
0.5000 mg | Freq: Once | INTRAMUSCULAR | Status: AC
  Administered 2019-10-05: 22:00:00 0.5 mg via INTRAVENOUS
  Filled 2019-10-05: qty 1

## 2019-10-05 MED ORDER — SODIUM CHLORIDE 0.9% FLUSH
3.0000 mL | Freq: Once | INTRAVENOUS | Status: AC
  Administered 2019-10-05: 23:00:00 3 mL via INTRAVENOUS

## 2019-10-05 NOTE — ED Notes (Signed)
Pt ambulated to the bedside toilet. Urine and culture collected and sent to lab

## 2019-10-05 NOTE — ED Triage Notes (Signed)
Patient c/o generalized abdominal pain and bloating since 0500 today. Patient went to an UC today and was given Phenergan IM with no relief.

## 2019-10-05 NOTE — ED Provider Notes (Signed)
[place] DEPT Provider Note   CSN: [id number] Arrival date & time: 10/05/19  1740     History Chief Complaint  Patient presents with  . Abdominal Pain  . Bloated    Kelly Harrington is a 74 y.o. female.  Patient complains of abdominal pain.  Patient states she has had diverticulitis before and feels like it is a recurrence  The history is provided by the patient. No language interpreter was used.  Abdominal Pain Pain location:  Generalized Pain quality: aching   Pain radiates to:  Does not radiate Pain severity:  Moderate Onset quality:  Sudden Timing:  Constant Progression:  Waxing and waning Chronicity:  New Context: not alcohol use   Relieved by:  Nothing Associated symptoms: no chest pain, no cough, no diarrhea, no fatigue and no hematuria        Past Medical History:  Diagnosis Date  . High cholesterol     There are no problems to display for this patient.   Past Surgical History:  Procedure Laterality Date  . ROTATOR CUFF REPAIR    . TRIGGER FINGER RELEASE       OB History   No obstetric history on file.     Family History  Problem Relation Age of Onset  . Heart failure Mother   . Hypertension Mother   . Heart failure Father   . Hypertension Father     Social History   Tobacco Use  . Smoking status: Never Smoker  . Smokeless tobacco: Never Used  Substance Use Topics  . Alcohol use: No  . Drug use: No    Home Medications Prior to Admission medications   Medication Sig Start Date End Date Taking? Authorizing Provider  acetaminophen (TYLENOL) 500 MG tablet Take 500-1,000 mg by mouth every 6 (six) hours as needed for moderate pain.   Yes [provider]  atorvastatin (LIPITOR) 40 MG tablet Take 40 mg by mouth as directed. Take on MWF   Yes [provider]  Biotin 1 MG CAPS Take 1 capsule by mouth daily.   Yes [provider]  cetirizine (ZYRTEC) 10 MG tablet Take 10 mg by mouth  daily as needed for allergies.   Yes [provider]  cholecalciferol (VITAMIN D3) 25 MCG (1000 UNIT) tablet Take 1,000 Units by mouth daily.   Yes [provider]  ibuprofen (ADVIL,MOTRIN) 200 MG tablet Take 200 mg by mouth every 6 (six) hours as needed for moderate pain.    Yes [provider]    Allergies    Codeine  Review of Systems   Review of Systems  Constitutional: Negative for appetite change and fatigue.  HENT: Negative for congestion, ear discharge and sinus pressure.   Eyes: Negative for discharge.  Respiratory: Negative for cough.   Cardiovascular: Negative for chest pain.  Gastrointestinal: Positive for abdominal pain. Negative for diarrhea.  Genitourinary: Negative for frequency and hematuria.  Musculoskeletal: Negative for back pain.  Skin: Negative for rash.  Neurological: Negative for seizures and headaches.  Psychiatric/Behavioral: Negative for hallucinations.    Physical Exam Updated Vital Signs BP (!) 150/90   Pulse 91   Temp 99.6 F (37.6 C) (Oral)   Resp 17   Ht 5\' 1"  (1.549 m)   Wt 57.2 kg   SpO2 97%   BMI 23.81 kg/m   Physical Exam Vitals and nursing note reviewed.  Constitutional:      Appearance: She is well-developed.  HENT:     Head:  Normocephalic.     Nose: Nose normal.  Eyes:     General: No scleral icterus.    Conjunctiva/sclera: Conjunctivae normal.  Neck:     Thyroid: No thyromegaly.  Cardiovascular:     Rate and Rhythm: Normal rate and regular rhythm.     Heart sounds: No murmur. No friction rub. No gallop.   Pulmonary:     Breath sounds: No stridor. No wheezing or rales.  Chest:     Chest wall: No tenderness.  Abdominal:     General: There is no distension.     Tenderness: There is abdominal tenderness. There is no rebound.  Musculoskeletal:        General: Normal range of motion.     Cervical back: Neck supple.  Lymphadenopathy:     Cervical: No cervical adenopathy.  Skin:    Findings: No  erythema or rash.  Neurological:     Mental Status: She is alert and oriented to person, place, and time.     Motor: No abnormal muscle tone.     Coordination: Coordination normal.  Psychiatric:        Behavior: Behavior normal.     ED Results / Procedures / Treatments   Labs (all labs ordered are listed, but only abnormal results are displayed) Labs Reviewed  COMPREHENSIVE METABOLIC PANEL - Abnormal; Notable for the following components:      Result Value   Glucose, Bld 135 (*)    Total Protein 8.3 (*)    Total Bilirubin 1.5 (*)    All other components within normal limits  LIPASE, BLOOD  CBC  URINALYSIS, ROUTINE W REFLEX MICROSCOPIC    EKG None  Radiology No results found.  Procedures Procedures (including critical care time)  Medications Ordered in ED Medications  sodium chloride flush (NS) 0.9 % injection 3 mL (has no administration in time range)  HYDROmorphone (DILAUDID) injection 0.5 mg (0.5 mg Intravenous Given 10/05/19 2154)  ondansetron (ZOFRAN) injection 4 mg (4 mg Intravenous Given 10/05/19 2152)  sodium chloride 0.9 % bolus 1,000 mL (1,000 mLs Intravenous New Bag/Given 10/05/19 2152)  iohexol (OMNIPAQUE) 300 MG/ML solution 100 mL (100 mLs Intravenous Contrast Given 10/05/19 2251)    ED Course  I have reviewed the triage vital signs and the nursing notes.  Pertinent labs & imaging results that were available during my care of the patient were reviewed by me and considered in my medical decision making (see chart for details).    CRITICAL CARE Performed by: Milton Ferguson Total critical care time: 60 minutes Critical care time was exclusive of separately billable procedures and treating other patients. Critical care was necessary to treat or prevent imminent or life-threatening deterioration. Critical care was time spent personally by me on the following activities: development of treatment plan with patient and/or surrogate as well as nursing, discussions  with consultants, evaluation of patient's response to treatment, examination of patient, obtaining history from patient or surrogate, ordering and performing treatments and interventions, ordering and review of laboratory studies, ordering and review of radiographic studies, pulse oximetry and re-evaluation of patient's condition.  MDM Rules/Calculators/A&P                     Patient with abdominal pain.  CT scan shows appendicitis.  General surgery will be consulted    This patient presents to the ED for concern of abdominal pain, this involves an extensive number of treatment options, and is a complaint that carries with it a high  risk of complications and morbidity.  The differential diagnosis includes diverticulitis kidney stone pyelonephritis appendicitis   Lab Tests:   I Ordered, reviewed, and interpreted labs, which included CBC chemistries urinalysis.  Patient has mild hematuria  Medicines ordered:   I ordered medication Dilaudid and Zofran for pain and nausea  Imaging Studies ordered:   I ordered imaging studies which included CT abdomen and  I independently visualized and interpreted imaging which showed appendicitis  Additional history obtained:   Additional history obtained from family member  Previous records obtained and reviewed   Consultations Obtained:   I consulted general surgery and discussed lab and imaging findings  Reevaluation:  After the interventions stated above, I reevaluated the patient and found patient improved with pain and nausea medicine  Critical Interventions:  .   Final Clinical Impression(s) / ED Diagnoses Final diagnoses:  None    Rx / DC Orders ED Discharge Orders    None       Milton Ferguson, MD 10/05/19 2334

## 2019-10-05 NOTE — ED Notes (Signed)
Pt transported to CT ?

## 2019-10-05 NOTE — ED Notes (Signed)
Pt and husband updated on plan of care. Pt stated that she does not want a blood transfusion if needed during her time at the hospital

## 2019-10-05 NOTE — ED Notes (Signed)
Pt assisted to [facility] with minimal assist.

## 2019-10-06 ENCOUNTER — Observation Stay (HOSPITAL_COMMUNITY): Payer: Medicare Other | Admitting: Certified Registered Nurse Anesthetist

## 2019-10-06 ENCOUNTER — Encounter (HOSPITAL_COMMUNITY): Payer: Self-pay

## 2019-10-06 ENCOUNTER — Encounter (HOSPITAL_COMMUNITY)
Admission: EM | Disposition: A | Discharge status: Home / Self Care | Payer: Self-pay | Source: Home / Self Care | Attending: Emergency Medicine

## 2019-10-06 DIAGNOSIS — C181 Malignant neoplasm of appendix: Secondary | ICD-10-CM | POA: Diagnosis not present

## 2019-10-06 DIAGNOSIS — E7800 Pure hypercholesterolemia, unspecified: Secondary | ICD-10-CM | POA: Diagnosis not present

## 2019-10-06 DIAGNOSIS — K3580 Unspecified acute appendicitis: Secondary | ICD-10-CM | POA: Diagnosis present

## 2019-10-06 DIAGNOSIS — E785 Hyperlipidemia, unspecified: Secondary | ICD-10-CM | POA: Diagnosis not present

## 2019-10-06 DIAGNOSIS — Z79899 Other long term (current) drug therapy: Secondary | ICD-10-CM | POA: Diagnosis not present

## 2019-10-06 HISTORY — PX: LAPAROSCOPIC APPENDECTOMY: SHX408

## 2019-10-06 LAB — RESPIRATORY PANEL BY RT PCR (FLU A&B, COVID)
Influenza A by PCR: NEGATIVE
Influenza B by PCR: NEGATIVE
SARS Coronavirus 2 by RT PCR: NEGATIVE

## 2019-10-06 SURGERY — APPENDECTOMY, LAPAROSCOPIC
Anesthesia: General

## 2019-10-06 MED ORDER — PROPOFOL 10 MG/ML IV BOLUS
INTRAVENOUS | Status: DC | PRN
  Administered 2019-10-06: 110 mg via INTRAVENOUS

## 2019-10-06 MED ORDER — FENTANYL CITRATE (PF) 100 MCG/2ML IJ SOLN
INTRAMUSCULAR | Status: DC | PRN
  Administered 2019-10-06 (×4): 50 ug via INTRAVENOUS

## 2019-10-06 MED ORDER — ONDANSETRON HCL 4 MG/2ML IJ SOLN
INTRAMUSCULAR | Status: AC
  Filled 2019-10-06: qty 2

## 2019-10-06 MED ORDER — ACETAMINOPHEN 325 MG PO TABS: 650.0000 mg | ORAL_TABLET | Freq: Four times a day (QID) | ORAL | Status: DC | PRN

## 2019-10-06 MED ORDER — FENTANYL CITRATE (PF) 100 MCG/2ML IJ SOLN
INTRAMUSCULAR | Status: AC
  Filled 2019-10-06: qty 2

## 2019-10-06 MED ORDER — ONDANSETRON HCL 4 MG/2ML IJ SOLN
4.0000 mg | Freq: Four times a day (QID) | INTRAMUSCULAR | Status: DC | PRN
  Administered 2019-10-06 – 2019-10-08 (×4): 4 mg via INTRAVENOUS
  Filled 2019-10-06 (×4): qty 2

## 2019-10-06 MED ORDER — METHOCARBAMOL 500 MG PO TABS: 500.0000 mg | ORAL_TABLET | Freq: Three times a day (TID) | ORAL | Status: DC | PRN

## 2019-10-06 MED ORDER — SUGAMMADEX SODIUM 500 MG/5ML IV SOLN
INTRAVENOUS | Status: DC | PRN
  Administered 2019-10-06: 200 mg via INTRAVENOUS

## 2019-10-06 MED ORDER — DEXAMETHASONE SODIUM PHOSPHATE 10 MG/ML IJ SOLN
INTRAMUSCULAR | Status: DC | PRN
  Administered 2019-10-06: 5 mg via INTRAVENOUS

## 2019-10-06 MED ORDER — ONDANSETRON HCL 4 MG/2ML IJ SOLN
INTRAMUSCULAR | Status: DC | PRN
  Administered 2019-10-06: 4 mg via INTRAVENOUS

## 2019-10-06 MED ORDER — LIDOCAINE 2% (20 MG/ML) 5 ML SYRINGE
INTRAMUSCULAR | Status: DC | PRN
  Administered 2019-10-06: 40 mg via INTRAVENOUS

## 2019-10-06 MED ORDER — ACETAMINOPHEN 650 MG RE SUPP: 650.0000 mg | Freq: Four times a day (QID) | RECTAL | Status: DC | PRN

## 2019-10-06 MED ORDER — ESMOLOL HCL 100 MG/10ML IV SOLN
INTRAVENOUS | Status: DC | PRN
  Administered 2019-10-06: 20 mg via INTRAVENOUS

## 2019-10-06 MED ORDER — ONDANSETRON 4 MG PO TBDP: 4.0000 mg | ORAL_TABLET | Freq: Four times a day (QID) | ORAL | Status: DC | PRN

## 2019-10-06 MED ORDER — BUPIVACAINE-EPINEPHRINE 0.5% -1:200000 IJ SOLN
INTRAMUSCULAR | Status: AC
  Filled 2019-10-06: qty 1

## 2019-10-06 MED ORDER — LABETALOL HCL 5 MG/ML IV SOLN
5.0000 mg | INTRAVENOUS | Status: DC | PRN
  Administered 2019-10-06: 5 mg via INTRAVENOUS

## 2019-10-06 MED ORDER — MIDAZOLAM HCL 2 MG/2ML IJ SOLN
INTRAMUSCULAR | Status: AC
  Filled 2019-10-06: qty 2

## 2019-10-06 MED ORDER — ROCURONIUM BROMIDE 10 MG/ML (PF) SYRINGE
PREFILLED_SYRINGE | INTRAVENOUS | Status: AC
  Filled 2019-10-06: qty 10

## 2019-10-06 MED ORDER — KCL IN DEXTROSE-NACL 20-5-0.45 MEQ/L-%-% IV SOLN
INTRAVENOUS | Status: DC
  Filled 2019-10-06: qty 1000

## 2019-10-06 MED ORDER — SUCCINYLCHOLINE CHLORIDE 200 MG/10ML IV SOSY
PREFILLED_SYRINGE | INTRAVENOUS | Status: DC | PRN
  Administered 2019-10-06: 120 mg via INTRAVENOUS

## 2019-10-06 MED ORDER — LIDOCAINE 2% (20 MG/ML) 5 ML SYRINGE
INTRAMUSCULAR | Status: AC
  Filled 2019-10-06: qty 5

## 2019-10-06 MED ORDER — LABETALOL HCL 5 MG/ML IV SOLN
INTRAVENOUS | Status: AC
  Filled 2019-10-06: qty 4

## 2019-10-06 MED ORDER — ACETAMINOPHEN 500 MG PO TABS
1000.0000 mg | ORAL_TABLET | Freq: Four times a day (QID) | ORAL | Status: DC
  Administered 2019-10-06 – 2019-10-08 (×8): 1000 mg via ORAL
  Filled 2019-10-06 (×8): qty 2

## 2019-10-06 MED ORDER — KCL IN DEXTROSE-NACL 20-5-0.45 MEQ/L-%-% IV SOLN
INTRAVENOUS | Status: DC
  Filled 2019-10-06 (×3): qty 1000

## 2019-10-06 MED ORDER — BUPIVACAINE HCL 0.25 % IJ SOLN
INTRAMUSCULAR | Status: AC
  Filled 2019-10-06: qty 1

## 2019-10-06 MED ORDER — DEXAMETHASONE SODIUM PHOSPHATE 10 MG/ML IJ SOLN
INTRAMUSCULAR | Status: AC
  Filled 2019-10-06: qty 1

## 2019-10-06 MED ORDER — SCOPOLAMINE 1 MG/3DAYS TD PT72
MEDICATED_PATCH | TRANSDERMAL | Status: DC | PRN
  Administered 2019-10-06: 1 via TRANSDERMAL

## 2019-10-06 MED ORDER — 0.9 % SODIUM CHLORIDE (POUR BTL) OPTIME
TOPICAL | Status: DC | PRN
  Administered 2019-10-06: 1000 mL

## 2019-10-06 MED ORDER — FENTANYL CITRATE (PF) 100 MCG/2ML IJ SOLN: 25.0000 ug | INTRAMUSCULAR | Status: DC | PRN

## 2019-10-06 MED ORDER — ROCURONIUM BROMIDE 50 MG/5ML IV SOSY
PREFILLED_SYRINGE | INTRAVENOUS | Status: DC | PRN
  Administered 2019-10-06: 40 mg via INTRAVENOUS
  Administered 2019-10-06: 10 mg via INTRAVENOUS

## 2019-10-06 MED ORDER — LIDOCAINE 2% (20 MG/ML) 5 ML SYRINGE
INTRAMUSCULAR | Status: DC | PRN
  Administered 2019-10-06: 1.5 mg/kg/h via INTRAVENOUS

## 2019-10-06 MED ORDER — BUPIVACAINE-EPINEPHRINE 0.25% -1:200000 IJ SOLN
INTRAMUSCULAR | Status: DC | PRN
  Administered 2019-10-06: 15 mL

## 2019-10-06 MED ORDER — LACTATED RINGERS IV SOLN: INTRAVENOUS | Status: DC

## 2019-10-06 MED ORDER — LABETALOL HCL 5 MG/ML IV SOLN: 10.0000 mg | INTRAVENOUS | Status: DC | PRN

## 2019-10-06 MED ORDER — AMLODIPINE BESYLATE 5 MG PO TABS
5.0000 mg | ORAL_TABLET | ORAL | Status: AC | PRN
  Administered 2019-10-06 (×2): 5 mg via ORAL
  Filled 2019-10-06 (×2): qty 1

## 2019-10-06 MED ORDER — PIPERACILLIN-TAZOBACTAM 3.375 G IVPB
3.3750 g | Freq: Three times a day (TID) | INTRAVENOUS | Status: DC
  Administered 2019-10-06: 07:00:00 3.375 g via INTRAVENOUS
  Filled 2019-10-06: qty 50

## 2019-10-06 MED ORDER — HYDROMORPHONE HCL 1 MG/ML IJ SOLN
1.0000 mg | INTRAMUSCULAR | Status: DC | PRN
  Administered 2019-10-06: 1 mg via INTRAVENOUS
  Filled 2019-10-06: qty 1

## 2019-10-06 MED ORDER — OXYCODONE HCL 5 MG PO TABS
5.0000 mg | ORAL_TABLET | ORAL | Status: DC | PRN
  Administered 2019-10-07: 10 mg via ORAL
  Filled 2019-10-06: qty 2

## 2019-10-06 MED ORDER — PROPOFOL 10 MG/ML IV BOLUS
INTRAVENOUS | Status: AC
  Filled 2019-10-06: qty 20

## 2019-10-06 MED ORDER — SCOPOLAMINE 1 MG/3DAYS TD PT72
MEDICATED_PATCH | TRANSDERMAL | Status: AC
  Filled 2019-10-06: qty 1

## 2019-10-06 SURGICAL SUPPLY — 30 items
APPLIER CLIP ROT 10 11.4 M/L (STAPLE) ×3
CABLE HIGH FREQUENCY MONO STRZ (ELECTRODE) ×3 IMPLANT
CHLORAPREP W/TINT 26 (MISCELLANEOUS) ×3 IMPLANT
CLIP APPLIE ROT 10 11.4 M/L (STAPLE) ×1 IMPLANT
COVER WAND RF STERILE (DRAPES) IMPLANT
CUTTER FLEX LINEAR 45M (STAPLE) ×3 IMPLANT
DECANTER SPIKE VIAL GLASS SM (MISCELLANEOUS) ×3 IMPLANT
DERMABOND ADVANCED (GAUZE/BANDAGES/DRESSINGS) ×2
DERMABOND ADVANCED .7 DNX12 (GAUZE/BANDAGES/DRESSINGS) ×1 IMPLANT
ELECT REM PT RETURN 15FT ADLT (MISCELLANEOUS) ×3 IMPLANT
ENDOLOOP SUT PDS II  0 18 (SUTURE)
ENDOLOOP SUT PDS II 0 18 (SUTURE) IMPLANT
GLOVE BIO SURGEON STRL SZ7.5 (GLOVE) ×3 IMPLANT
GOWN STRL REUS W/TWL XL LVL3 (GOWN DISPOSABLE) ×6 IMPLANT
KIT BASIN OR (CUSTOM PROCEDURE TRAY) ×3 IMPLANT
KIT TURNOVER KIT A (KITS) ×3 IMPLANT
PENCIL SMOKE EVACUATOR (MISCELLANEOUS) IMPLANT
POUCH SPECIMEN RETRIEVAL 10MM (ENDOMECHANICALS) ×3 IMPLANT
RELOAD 45 VASCULAR/THIN (ENDOMECHANICALS) ×3 IMPLANT
RELOAD STAPLE TA45 3.5 REG BLU (ENDOMECHANICALS) ×3 IMPLANT
SCISSORS LAP 5X35 DISP (ENDOMECHANICALS) ×3 IMPLANT
SET IRRIG TUBING LAPAROSCOPIC (IRRIGATION / IRRIGATOR) ×3 IMPLANT
SET TUBE SMOKE EVAC HIGH FLOW (TUBING) ×3 IMPLANT
SHEARS HARMONIC ACE PLUS 36CM (ENDOMECHANICALS) ×3 IMPLANT
SUT MNCRL AB 4-0 PS2 18 (SUTURE) ×3 IMPLANT
TOWEL OR 17X26 10 PK STRL BLUE (TOWEL DISPOSABLE) ×3 IMPLANT
TRAY FOLEY MTR SLVR 16FR STAT (SET/KITS/TRAYS/PACK) ×3 IMPLANT
TRAY LAPAROSCOPIC (CUSTOM PROCEDURE TRAY) ×3 IMPLANT
TROCAR BLADELESS OPT 5 100 (ENDOMECHANICALS) ×3 IMPLANT
TROCAR XCEL BLUNT TIP 100MML (ENDOMECHANICALS) ×3 IMPLANT

## 2019-10-06 NOTE — Anesthesia Preprocedure Evaluation (Addendum)
Anesthesia Evaluation  Patient identified by MRN, date of birth, ID band Patient awake    Reviewed: Allergy & Precautions, NPO status , Patient's Chart, lab work & pertinent test results  Airway Mallampati: II  TM Distance: >3 FB Neck ROM: Full    Dental no notable dental hx. (+) Teeth Intact, Dental Advisory Given   Pulmonary neg pulmonary ROS,    Pulmonary exam normal breath sounds clear to auscultation       Cardiovascular Normal cardiovascular exam Rhythm:Regular Rate:Normal  HLD   Neuro/Psych negative neurological ROS  negative psych ROS   GI/Hepatic negative GI ROS, Neg liver ROS,   Endo/Other  negative endocrine ROS  Renal/GU negative Renal ROS  negative genitourinary   Musculoskeletal negative musculoskeletal ROS (+)   Abdominal   Peds  Hematology  (+) REFUSES BLOOD PRODUCTS (refuses albumin), JEHOVAH'S WITNESS  Anesthesia Other Findings Acute appendicitis  Reproductive/Obstetrics                            Anesthesia Physical Anesthesia Plan  ASA: II  Anesthesia Plan: General   Post-op Pain Management:    Induction: Intravenous  PONV Risk Score and Plan: 3 and Midazolam, Dexamethasone, Ondansetron and Scopolamine patch - Pre-op  Airway Management Planned: Oral ETT  Additional Equipment:   Intra-op Plan:   Post-operative Plan: Extubation in OR  Informed Consent: I have reviewed the patients History and Physical, chart, labs and discussed the procedure including the risks, benefits and alternatives for the proposed anesthesia with the patient or authorized representative who has indicated his/her understanding and acceptance.     Dental advisory given  Plan Discussed with: CRNA  Anesthesia Plan Comments:        Anesthesia Quick Evaluation

## 2019-10-06 NOTE — Transfer of Care (Signed)
Immediate Anesthesia Transfer of Care Note  Patient: Shirin Demke  Procedure(s) Performed: APPENDECTOMY LAPAROSCOPIC (N/A )  Patient Location: PACU  Anesthesia Type:General  Level of Consciousness: awake, alert  and patient cooperative  Airway & Oxygen Therapy: Patient Spontanous Breathing and Patient connected to face mask oxygen  Post-op Assessment: Report given to RN and Post -op Vital signs reviewed and stable  Post vital signs: Reviewed and stable  Last Vitals:  Vitals Value Taken Time  BP 177/97 10/06/19 1227  Temp    Pulse 80 10/06/19 1230  Resp 20 10/06/19 1230  SpO2 100 % 10/06/19 1230  Vitals shown include unvalidated device data.  Last Pain:  Vitals:   10/06/19 0955  TempSrc:   PainSc: 3          Complications: No apparent anesthesia complications

## 2019-10-06 NOTE — ED Notes (Signed)
Pt ambulated to BR with steady gait.

## 2019-10-06 NOTE — Op Note (Signed)
10/05/2019 - 10/06/2019  12:14 PM  PATIENT:  Kelly Harrington  74 y.o. female  PRE-OPERATIVE DIAGNOSIS:  acute appendicitis  POST-OPERATIVE DIAGNOSIS:  acute appendicitis   PROCEDURE:  Procedure(s): APPENDECTOMY LAPAROSCOPIC (N/A)  SURGEON:  Surgeon(s) and Role:    * Jovita Kussmaul, MD - Primary  PHYSICIAN ASSISTANT:   ASSISTANTS: none   ANESTHESIA:   local and general  EBL:  minimal   BLOOD ADMINISTERED:none  DRAINS: none   LOCAL MEDICATIONS USED:  MARCAINE     SPECIMEN:  Source of Specimen:  appendix  DISPOSITION OF SPECIMEN:  PATHOLOGY  COUNTS:  YES  TOURNIQUET:  * No tourniquets in log *  DICTATION: .Dragon Dictation   After informed consent was obtained patient was brought to the operating room placed in the supine position on the operating room table. After adequate induction of general anesthesia the patient's abdomen was prepped with ChloraPrep, allowed to dry, and draped in usual sterile manner. The area below the umbilicus was infiltrated with quarter percent Marcaine. A small incision was made with a 15 blade knife. This incision was carried down through the subcutaneous tissue bluntly with a hemostat and [employer] retractors until the linea alba was identified. The linea alba was incised with a 15 blade knife. Each side was grasped Coker clamps and elevated anteriorly. The preperitoneal space was probed bluntly with a hemostat until the peritoneum was opened and access was gained to the abdominal cavity. A 0 Vicryl purse string stitch was placed in the fascia surrounding the opening. A Hassan cannula was placed through the opening and anchored in place with the previously placed Vicryl purse string stitch. The laparoscope was placed through the [facility] cannula. The abdomen was insufflated with carbon dioxide without difficulty. Next the suprapubic area was infiltrated with quarter percent Marcaine. A small incision was made with a 15 blade knife. A 5 mm port was  placed bluntly through this incision into the abdominal cavity. A site was then chosen between the 2 port for placement of a 5 mm port. The area was infiltrated with quarter percent Marcaine. A small stab incision was made with a 15 blade knife. A 5 mm port was placed bluntly through this incision and the abdominal cavity under direct vision. The laparoscope was then moved to the suprapubic port. Using a Glassman grasper and harmonic scalpel the right abdomen was inspected. The appendix was readily identified in the right upper quadrant.  The appendix was elevated anteriorly and the mesoappendix was taken down sharply with the harmonic scalpel. Once the base of the appendix where it joined the cecum was identified and cleared of any tissue then a laparoscopic GIA blue load 6 row stapler was placed through the [facility] cannula. The stapler was placed across the base of the appendix clamped and fired thereby dividing the base of the appendix between staple lines. There was a small amount of bleeding from the staple line that was controlled with clips.  A laparoscopic bag was then inserted through the [facility] cannula. The appendix was placed within the bag and the bag was sealed. The abdomen was then irrigated with copious amounts of saline until the effluent was clear. No other abnormalities were noted. The appendix and bag were removed with the [facility] cannula through the infraumbilical port without difficulty. The fascial defect was closed with the previously placed Vicryl pursestring stitch as well as with another interrupted 0 Vicryl figure-of-eight stitch. The rest of the ports were removed under direct vision and were found  to be hemostatic. The gas was allowed to escape. The skin incisions were closed with interrupted 4-0 Monocryl subcuticular stitches. Dermabond dressings were applied. The patient tolerated the procedure well. At the end of the case all needle sponge and instrument counts were correct. The patient  was then awakened and taken to recovery in stable condition.  PLAN OF CARE: Admit for overnight observation  PATIENT DISPOSITION:  PACU - hemodynamically stable.   Delay start of Pharmacological VTE agent (>24hrs) due to surgical blood loss or risk of bleeding: not applicable

## 2019-10-06 NOTE — ED Notes (Signed)
Pt provided with impatient hospital bed for comfort due to holding in ED.

## 2019-10-06 NOTE — Anesthesia Procedure Notes (Signed)
Procedure Name: Intubation Date/Time: 10/06/2019 11:11 AM Performed by: West Pugh, CRNA Pre-anesthesia Checklist: Patient identified, Emergency Drugs available, Suction available, Patient being monitored and Timeout performed Patient Re-evaluated:Patient Re-evaluated prior to induction Oxygen Delivery Method: Circle system utilized Preoxygenation: Pre-oxygenation with 100% oxygen Induction Type: IV induction Laryngoscope Size: Mac and 3 Grade View: Grade I Tube type: Oral Tube size: 7.0 mm Number of attempts: 1 Airway Equipment and Method: Stylet Placement Confirmation: ETT inserted through vocal cords under direct vision,  positive ETCO2,  CO2 detector and breath sounds checked- equal and bilateral Secured at: 21 cm Tube secured with: Tape Dental Injury: Teeth and Oropharynx as per pre-operative assessment

## 2019-10-06 NOTE — ED Notes (Signed)
Pt ambulated back to room from BR with no assist; steady gait.

## 2019-10-06 NOTE — ED Notes (Signed)
Surgeon at bedside.  

## 2019-10-06 NOTE — H&P (Signed)
Kelly Harrington is an 74 y.o. female.    General Surgery [facility]  Chief Complaint: abdominal pain, nausea and emesis  HPI: Patient is a 74 year old female who was awakened from sleep approximately 5 AM on the morning of October 05, 2019 with mid abdominal pain.  Pain persisted.  Patient developed nausea and emesis.  She did have a normal bowel movement during the day.  Patient presented to [facility] urgent care for evaluation.  She was referred to the emergency department for further assessment.  Patient has been afebrile.  She did feel feverish and have some chills.  White blood cell count is normal at 8.6.  Patient underwent CT scan of the abdomen and pelvis which shows an abnormally located appendix in the right upper quadrant with inflammatory changes consistent with early acute appendicitis without evidence of perforation.  Patient has had a previous oophorectomy for tubal pregnancy.  She has also had a breast biopsy performed by Dr. Zella Richer in our practice.  She presents now to the emergency department accompanied by her husband.  General surgery is called for management.  Past Medical History:  Diagnosis Date  . High cholesterol     Past Surgical History:  Procedure Laterality Date  . ROTATOR CUFF REPAIR    . TRIGGER FINGER RELEASE      Family History  Problem Relation Age of Onset  . Heart failure Mother   . Hypertension Mother   . Heart failure Father   . Hypertension Father    Social History:  reports that she has never smoked. She has never used smokeless tobacco. She reports that she does not drink alcohol or use drugs.  Allergies:  Allergies  Allergen Reactions  . Codeine     (Not in a hospital admission)   Results for orders placed or performed during the hospital encounter of 10/05/19 (from the past 48 hour(s))  Lipase, blood     Status: None   Collection Time: 10/05/19  6:02 PM  Result Value Ref Range   Lipase 19 11 - 51 U/L    Comment:  Performed at [facility], [place] [address], [place], [place] [address]  Comprehensive metabolic panel     Status: Abnormal   Collection Time: 10/05/19  6:02 PM  Result Value Ref Range   Sodium 136 135 - 145 mmol/L   Potassium 3.9 3.5 - 5.1 mmol/L   Chloride 99 98 - 111 mmol/L   CO2 24 22 - 32 mmol/L   Glucose, Bld 135 (H) 70 - 99 mg/dL    Comment: Glucose reference range applies only to samples taken after fasting for at least 8 hours.   BUN 10 8 - 23 mg/dL   Creatinine, Ser 0.64 0.44 - 1.00 mg/dL   Calcium 9.5 8.9 - 10.3 mg/dL   Total Protein 8.3 (H) 6.5 - 8.1 g/dL   Albumin 4.7 3.5 - 5.0 g/dL   AST 29 15 - 41 U/L   ALT 21 0 - 44 U/L   Alkaline Phosphatase 40 38 - 126 U/L   Total Bilirubin 1.5 (H) 0.3 - 1.2 mg/dL   GFR calc non Af Amer >60 >60 mL/min   GFR calc Af Amer >60 >60 mL/min   Anion gap 13 5 - 15    Comment: Performed at [facility], [place] [address], [place], [place] [address]  CBC     Status: None   Collection Time: 10/05/19  6:02 PM  Result Value Ref  Range   WBC 8.6 4.0 - 10.5 K/uL   RBC 4.32 3.87 - 5.11 MIL/uL   Hemoglobin 13.1 12.0 - 15.0 g/dL   HCT 40.8 36.0 - 46.0 %   MCV 94.4 80.0 - 100.0 fL   MCH 30.3 26.0 - 34.0 pg   MCHC 32.1 30.0 - 36.0 g/dL   RDW 12.6 11.5 - 15.5 %   Platelets 301 150 - 400 K/uL   nRBC 0.0 0.0 - 0.2 %    Comment: Performed at [facility], [place] [address], [place], [place] [address]  Urinalysis, Routine w reflex microscopic     Status: Abnormal   Collection Time: 10/05/19  6:02 PM  Result Value Ref Range   Color, Urine YELLOW YELLOW   APPearance CLEAR CLEAR   Specific Gravity, Urine 1.028 1.005 - 1.030   pH 5.0 5.0 - 8.0   Glucose, UA NEGATIVE NEGATIVE mg/dL   Hgb urine dipstick NEGATIVE NEGATIVE   Bilirubin Urine NEGATIVE NEGATIVE   Ketones, ur 20 (A) NEGATIVE mg/dL   Protein, ur 100 (A) NEGATIVE mg/dL   Nitrite NEGATIVE NEGATIVE   Leukocytes,Ua NEGATIVE NEGATIVE   RBC /  HPF 6-10 0 - 5 RBC/hpf   WBC, UA 0-5 0 - 5 WBC/hpf   Bacteria, UA RARE (A) NONE SEEN   Mucus PRESENT     Comment: Performed at [facility], [place] [address] [place], [place] [address]   CT ABDOMEN PELVIS W CONTRAST  Result Date: 10/05/2019 CLINICAL DATA:  Acute abdominal pain. Neutropenia. Bloating. EXAM: CT ABDOMEN AND PELVIS WITH CONTRAST TECHNIQUE: Multidetector CT imaging of the abdomen and pelvis was performed using the standard protocol following bolus administration of intravenous contrast. CONTRAST:  123mL OMNIPAQUE IOHEXOL 300 MG/ML  SOLN COMPARISON:  Radiograph earlier this day FINDINGS: Lower chest: Dependent atelectasis in both lower lobes. No pleural fluid. Hepatobiliary: No focal liver abnormality is seen. No gallstones, gallbladder wall thickening, or biliary dilatation. Pancreas: No ductal dilatation or inflammation. Spleen: Normal in size without focal abnormality. Adrenals/Urinary Tract: Normal adrenal glands. No hydronephrosis or perinephric edema. Homogeneous renal enhancement with symmetric excretion on delayed phase imaging. Urinary bladder is partially distended without wall thickening. Stomach/Bowel: Findings suspicious for acute appendicitis as described below. The appendix is dilated and fluid-filled. Fluid/ingested material in the stomach. There is no gastric wall thickening. Normal positioning of the ligament of Treitz. Scattered fluid-filled small bowel without evidence of obstruction or small bowel inflammation. Mild colonic tortuosity. Small volume of stool throughout the colon intermixed with colonic nondistention. Appendix: Location: Right upper quadrant coursing inferior to the liver, series 3, image 30, series 5, image 59. Diameter: 9 mm. Appendicolith: No. Mucosal hyper-enhancement: Yes. Extraluminal gas: No. Periappendiceal collection: No. Faint periappendiceal fat stranding about the tip. Vascular/Lymphatic: Normal caliber abdominal aorta. Mild distal  aortic atherosclerosis. The portal vein is patent. Mesenteric vessels are patent. No bulky abdominopelvic adenopathy. Reproductive: Postmenopausal appearance of the uterus. Small amount of free fluid in the pelvis. No obvious adnexal mass. Other: No intra-abdominal abscess or free air. Small amount of free fluid in the pelvis which is nonspecific, and may be reactive. Tiny fat containing umbilical hernia. Musculoskeletal: There are no acute or suspicious osseous abnormalities. Degenerative disc disease in the lower lumbar spine. Multilevel facet hypertrophy. IMPRESSION: 1. Findings consistent with uncomplicated acute appendicitis. The appendix courses into the right upper quadrant and is located just inferior to the liver. No abscess or perforation. 2. Small amount of free fluid in the pelvis is  nonspecific, and may be reactive. Aortic Atherosclerosis (ICD10-I70.0). Electronically Signed   By: Keith Rake M.D.   On: 10/05/2019 23:19    Review of Systems  Constitutional: Positive for appetite change and fever.  HENT: Negative.   Eyes: Negative.   Respiratory: Negative.   Cardiovascular: Negative.   Gastrointestinal: Positive for abdominal pain, nausea and vomiting.  Endocrine: Negative.  Polyphagia:    Genitourinary: Negative.   Musculoskeletal: Negative.   Skin: Negative.   Allergic/Immunologic: Negative.   Neurological: Negative.   Hematological: Negative.   Psychiatric/Behavioral: Negative.    EXAM:  Blood pressure (!) 169/94, pulse 85, temperature 99.6 F (37.6 C), temperature source Oral, resp. rate 16, height 5\' 1"  (1.549 m), weight 57.2 kg, SpO2 99 %.  CONSTITUTIONAL: NAD; conversant; no obvious deformities EYES:   Moist conjunctiva; no lid lag; anicteric; PERRL NECK:   Trachea midline; no thyromegaly LUNGS:  Normal respiratory effort; no wheeze; no rales; no tactile fremitus CV:   RRR; no palpable thrills; no pitting edema GI:    Abdomen is soft without distension; quiet to  auscultation; no palpable hepatosplenomegaly; mild diffuse tenderness to palpation; no masses; well healed Pfannenstiehl incision MSK:    Normal range of motion of extremities; no clubbing; no cyanosis PSYCHIATRIC:  Appropriate affect for situation; alert and oriented X 3 LYMPHATIC:   No palpable cervical lymphadenopathy; no palpable axillary adenopathy   Assessment/Plan Acute appendicitis  Admit to general surgery service for appendectomy this morning as OR schedule allows Will need Covid 19 testing completed prior to surgery - sample just now submitted IV Zosyn started in ER NPO, IV hydration  The risks and benefits of the procedure have been discussed at length with the patient.  The patient understands the proposed procedure, potential alternative treatments, and the course of recovery to be expected.  All of the patient's questions have been answered at this time.  The patient wishes to proceed with surgery.  Armandina Gemma, MD [facility] Office: [place], [place] 10/06/2019, 12:36 AM

## 2019-10-06 NOTE — ED Notes (Signed)
Pts husband provided with reclining chair. Pt ambulated to the restroom without difficulty

## 2019-10-06 NOTE — ED Notes (Signed)
Due to high census, pt is holding in the ED. Pt placed on hospital bed for comfort. Bed in locked and lowest position. Call bell within reach.

## 2019-10-07 ENCOUNTER — Encounter: Payer: Self-pay | Admitting: *Deleted

## 2019-10-07 DIAGNOSIS — K913 Postprocedural intestinal obstruction, unspecified as to partial versus complete: Secondary | ICD-10-CM | POA: Diagnosis not present

## 2019-10-07 DIAGNOSIS — R112 Nausea with vomiting, unspecified: Secondary | ICD-10-CM | POA: Diagnosis not present

## 2019-10-07 LAB — SURGICAL PATHOLOGY

## 2019-10-07 MED ORDER — METOPROLOL TARTRATE 12.5 MG HALF TABLET
12.5000 mg | ORAL_TABLET | Freq: Two times a day (BID) | ORAL | Status: DC
  Administered 2019-10-07 – 2019-10-08 (×3): 12.5 mg via ORAL
  Filled 2019-10-07 (×3): qty 1

## 2019-10-07 MED ORDER — PROMETHAZINE HCL 25 MG/ML IJ SOLN: 12.5000 mg | Freq: Four times a day (QID) | INTRAMUSCULAR | Status: DC | PRN

## 2019-10-07 NOTE — Progress Notes (Signed)
1 Day Post-Op   Subjective/Chief Complaint: Complains of nausea and hypertension   Objective: Vital signs in last 24 hours: Temp:  [98.7 F (37.1 C)-99.7 F (37.6 C)] 98.9 F (37.2 C) (04/21 1000) Pulse Rate:  [65-88] 84 (04/21 1000) Resp:  [12-22] 18 (04/21 1000) BP: (146-184)/(80-104) 176/99 (04/21 1000) SpO2:  [94 %-100 %] 98 % (04/21 1000) Last BM Date: 10/05/19  Intake/Output from previous day: 04/20 0701 - 04/21 0700 In: 3287.4 [P.O.:480; I.V.:2779.6; IV Piggyback:27.8] Out: 1960 [Urine:1960] Intake/Output this shift: Total I/O In: 300.5 [P.O.:120; I.V.:180.5] Out: 700 [Urine:700]  General appearance: alert and cooperative Resp: clear to auscultation bilaterally Cardio: regular rate and rhythm GI: soft, minimal tenderness. incisions look good  Lab Results:  Recent Labs    10/05/19 1802  WBC 8.6  HGB 13.1  HCT 40.8  PLT 301   BMET Recent Labs    10/05/19 1802  NA 136  K 3.9  CL 99  CO2 24  GLUCOSE 135*  BUN 10  CREATININE 0.64  CALCIUM 9.5   PT/INR No results for input(s): LABPROT, INR in the last 72 hours. ABG No results for input(s): PHART, HCO3 in the last 72 hours.  Invalid input(s): PCO2, PO2  Studies/Results: CT ABDOMEN PELVIS W CONTRAST  Result Date: 10/05/2019 CLINICAL DATA:  Acute abdominal pain. Neutropenia. Bloating. EXAM: CT ABDOMEN AND PELVIS WITH CONTRAST TECHNIQUE: Multidetector CT imaging of the abdomen and pelvis was performed using the standard protocol following bolus administration of intravenous contrast. CONTRAST:  16mL OMNIPAQUE IOHEXOL 300 MG/ML  SOLN COMPARISON:  Radiograph earlier this day FINDINGS: Lower chest: Dependent atelectasis in both lower lobes. No pleural fluid. Hepatobiliary: No focal liver abnormality is seen. No gallstones, gallbladder wall thickening, or biliary dilatation. Pancreas: No ductal dilatation or inflammation. Spleen: Normal in size without focal abnormality. Adrenals/Urinary Tract: Normal adrenal  glands. No hydronephrosis or perinephric edema. Homogeneous renal enhancement with symmetric excretion on delayed phase imaging. Urinary bladder is partially distended without wall thickening. Stomach/Bowel: Findings suspicious for acute appendicitis as described below. The appendix is dilated and fluid-filled. Fluid/ingested material in the stomach. There is no gastric wall thickening. Normal positioning of the ligament of Treitz. Scattered fluid-filled small bowel without evidence of obstruction or small bowel inflammation. Mild colonic tortuosity. Small volume of stool throughout the colon intermixed with colonic nondistention. Appendix: Location: Right upper quadrant coursing inferior to the liver, series 3, image 30, series 5, image 59. Diameter: 9 mm. Appendicolith: No. Mucosal hyper-enhancement: Yes. Extraluminal gas: No. Periappendiceal collection: No. Faint periappendiceal fat stranding about the tip. Vascular/Lymphatic: Normal caliber abdominal aorta. Mild distal aortic atherosclerosis. The portal vein is patent. Mesenteric vessels are patent. No bulky abdominopelvic adenopathy. Reproductive: Postmenopausal appearance of the uterus. Small amount of free fluid in the pelvis. No obvious adnexal mass. Other: No intra-abdominal abscess or free air. Small amount of free fluid in the pelvis which is nonspecific, and may be reactive. Tiny fat containing umbilical hernia. Musculoskeletal: There are no acute or suspicious osseous abnormalities. Degenerative disc disease in the lower lumbar spine. Multilevel facet hypertrophy. IMPRESSION: 1. Findings consistent with uncomplicated acute appendicitis. The appendix courses into the right upper quadrant and is located just inferior to the liver. No abscess or perforation. 2. Small amount of free fluid in the pelvis is nonspecific, and may be reactive. Aortic Atherosclerosis (ICD10-I70.0). Electronically Signed   By: Keith Rake M.D.   On: 10/05/2019 23:19     Anti-infectives: Anti-infectives (From admission, onward)   Start  Dose/Rate Route Frequency Ordered Stop   10/06/19 0600  piperacillin-tazobactam (ZOSYN) IVPB 3.375 g  Status:  Discontinued     3.375 g 12.5 mL/hr over 240 Minutes Intravenous Every 8 hours 10/06/19 0047 10/06/19 1403   10/05/19 2330  piperacillin-tazobactam (ZOSYN) IVPB 3.375 g     3.375 g 100 mL/hr over 30 Minutes Intravenous  Once 10/05/19 2328 10/06/19 0019      Assessment/Plan: s/p Procedure(s): APPENDECTOMY LAPAROSCOPIC (N/A) Advance diet  Start metoprolol for hypertension Ambulate Hold on d/c until nausea resolves  LOS: 0 days    Autumn Messing III 10/07/2019

## 2019-10-07 NOTE — Plan of Care (Signed)
  Problem: Education: Goal: Knowledge of General Education information will improve Description: Including pain rating scale, medication(s)/side effects and non-pharmacologic comfort measures Outcome: Progressing   Problem: Activity: Goal: Risk for activity intolerance will decrease Outcome: Progressing   Problem: Elimination: Goal: Will not experience complications related to urinary retention Outcome: Progressing   Problem: Pain Managment: Goal: General experience of comfort will improve Outcome: Progressing   

## 2019-10-08 MED ORDER — ONDANSETRON 4 MG PO TBDP: 4.0000 mg | ORAL_TABLET | Freq: Four times a day (QID) | ORAL | 0 refills | Status: DC | PRN

## 2019-10-08 MED ORDER — OXYCODONE HCL 5 MG PO TABS: 5.0000 mg | ORAL_TABLET | Freq: Four times a day (QID) | ORAL | 0 refills | Status: DC | PRN

## 2019-10-08 NOTE — Plan of Care (Signed)
Patient discharged home in stable condition 

## 2019-10-08 NOTE — Discharge Summary (Addendum)
Patient ID: Kelly Harrington [id number] 02-07-1946 74 y.o.  Admit date: 10/05/2019 Discharge date: 10/08/2019  Admitting Diagnosis: Acute appendicitis  Discharge Diagnosis Patient Active Problem List   Diagnosis Date Noted   Appendicitis, acute 10/06/2019   Acute appendicitis 10/06/2019  low grade appendiceal mucinous neoplasm  Consultants none  Reason for Admission: Patient is a 74 year old female who was awakened from sleep approximately 5 AM on the morning of October 05, 2019 with mid abdominal pain.  Pain persisted.  Patient developed nausea and emesis.  She did have a normal bowel movement during the day.  Patient presented to [facility] urgent care for evaluation.  She was referred to the emergency department for further assessment.  Patient has been afebrile.  She did feel feverish and have some chills.  White blood cell count is normal at 8.6.  Patient underwent CT scan of the abdomen and pelvis which shows an abnormally located appendix in the right upper quadrant with inflammatory changes consistent with early acute appendicitis without evidence of perforation.  Patient has had a previous oophorectomy for tubal pregnancy.  She has also had a breast biopsy performed by Dr. Zella Richer in our practice.  She presents now to the emergency department accompanied by her husband.  General surgery is called for management.  Procedures Lap appy by Dr. Marlou Starks, 4/20  Hospital Course:  The patient was admitted and underwent a laparoscopic appendectomy.  The patient tolerated the procedure well. She had some intermittent nausea issues on POD 1, but by POD 2, the patient was tolerating a regular diet, voiding well, mobilizing, and pain was controlled with oral pain medications.  The patient was stable for DC home at this time with appropriate follow up made.  Her pathology did return prior to discharge and revealed a low grade appendiceal mucinous neoplasm.  This was discussed with the patient.   Her margins were negative.  We will have her seen oncology as an outpatient for evaluation, but will likely not require hopefully any further treatment.   Physical Exam: Abd: soft, appropriately tender, incisions are c/d/i with dermabond present, +BS  Allergies as of 10/08/2019      Reactions   Codeine       Medication List    TAKE these medications   acetaminophen 500 MG tablet Commonly known as: TYLENOL Take 500-1,000 mg by mouth every 6 (six) hours as needed for moderate pain.   atorvastatin 40 MG tablet Commonly known as: LIPITOR Take 40 mg by mouth as directed. Take on MWF   Biotin 1 MG Caps Take 1 capsule by mouth daily.   cetirizine 10 MG tablet Commonly known as: ZYRTEC Take 10 mg by mouth daily as needed for allergies.   cholecalciferol 25 MCG (1000 UNIT) tablet Commonly known as: VITAMIN D3 Take 1,000 Units by mouth daily.   ibuprofen 200 MG tablet Commonly known as: ADVIL Take 200 mg by mouth every 6 (six) hours as needed for moderate pain.   ondansetron 4 MG disintegrating tablet Commonly known as: ZOFRAN-ODT Take 1 tablet (4 mg total) by mouth every 6 (six) hours as needed for nausea.   oxyCODONE 5 MG immediate release tablet Commonly known as: Oxy IR/ROXICODONE Take 1 tablet (5 mg total) by mouth every 6 (six) hours as needed for moderate pain.        Follow-up Information    Autumn Messing III, MD Follow up in 3 week(s).   Specialty: General Surgery Why: our office will call you with appointment date  and time. Contact information: [address] [place] [address] [phone]        [facility] Follow up.   Why: They should call you with appointment date and time after referral is placed.          Signed: Saverio Danker, [facility] 10/08/2019, 10:12 AM Please see Amion for pager number during day hours 7:00am-4:30pm, 7-11:30am on Weekends

## 2019-10-08 NOTE — TOC Initial Note (Signed)
Transition of Care [facility]) - Initial/Assessment Note    Patient Details  Name: Kelly Harrington MRN: [id number] Date of Birth: 01-20-1946  [facility] CM/SW Contact:    Leeroy Cha, RN Phone Number: 10/08/2019, 10:18 AM  Clinical Narrative:                 dcd to home   Expected Discharge Plan: Home/Self Care Barriers to Discharge: No Barriers Identified   Patient Goals and CMS Choice Patient states their goals for this hospitalization and ongoing recovery are:: to go home      Expected Discharge Plan and Services Expected Discharge Plan: Home/Self Care       Living arrangements for the past 2 months: Single Family Home Expected Discharge Date: 10/08/19                                    Prior Living Arrangements/Services Living arrangements for the past 2 months: Single Family Home Lives with:: Spouse Patient language and need for interpreter reviewed:: No Do you feel safe going back to the place where you live?: Yes      Need for Family Participation in Patient Care: Yes (Comment) Care giver support system in place?: Yes (comment)   Criminal Activity/Legal Involvement Pertinent to Current Situation/Hospitalization: No - Comment as needed  Activities of Daily Living Home Assistive Devices/Equipment: None ADL Screening (condition at time of admission) Patient's cognitive ability adequate to safely complete daily activities?: Yes Is the patient deaf or have difficulty hearing?: No Does the patient have difficulty seeing, even when wearing glasses/contacts?: No Does the patient have difficulty concentrating, remembering, or making decisions?: No Patient able to express need for assistance with ADLs?: Yes Does the patient have difficulty dressing or bathing?: No Independently performs ADLs?: Yes (appropriate for developmental age) Does the patient have difficulty walking or climbing stairs?: No Weakness of Legs: None Weakness of Arms/Hands:  None  Permission Sought/Granted                  Emotional Assessment Appearance:: Appears stated age     Orientation: : Oriented to Self, Oriented to Place, Oriented to  Time, Oriented to Situation Alcohol / Substance Use: Not Applicable Psych Involvement: No (comment)  Admission diagnosis:  Acute appendicitis [K35.80] Acute appendicitis, unspecified acute appendicitis type [K35.80] Patient Active Problem List   Diagnosis Date Noted  . Appendicitis, acute 10/06/2019  . Acute appendicitis 10/06/2019   PCP:  Alroy Dust, L.Marlou Sa, MD Pharmacy:   [facility] [address] [place] [address] [place] [address] Phone: [phone] Fax: [phone]     Social Determinants of Health (SDOH) Interventions    Readmission Risk Interventions No flowsheet data found.

## 2019-10-08 NOTE — Anesthesia Postprocedure Evaluation (Signed)
Anesthesia Post Note  Patient: Kelly Harrington  Procedure(s) Performed: APPENDECTOMY LAPAROSCOPIC (N/A )     Patient location during evaluation: PACU Anesthesia Type: General Level of consciousness: awake and alert Pain management: pain level controlled Vital Signs Assessment: post-procedure vital signs reviewed and stable Respiratory status: spontaneous breathing, nonlabored ventilation, respiratory function stable and patient connected to nasal cannula oxygen Cardiovascular status: blood pressure returned to baseline and stable Postop Assessment: no apparent nausea or vomiting Anesthetic complications: no    Last Vitals:  Vitals:   10/07/19 2105 10/08/19 0615  BP: (!) 165/114 (!) 155/88  Pulse: 80 (!) 59  Resp: 16 18  Temp: 37.3 C 37.1 C  SpO2: 97% 98%    Last Pain:  Vitals:   10/08/19 0818  TempSrc:   PainSc: 0-No pain                 Khaylee Mcevoy L Rhett Mutschler

## 2019-10-08 NOTE — Discharge Instructions (Signed)
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  Your margins were negative.  We will refer you to the oncology center to follow up to get their evaluation.  [facility]  Please arrive at least 30 min before your appointment to complete your check in paperwork.  If you are unable to arrive 30 min prior to your appointment time we may have to cancel or reschedule you. LAPAROSCOPIC SURGERY: POST OP INSTRUCTIONS Always review your discharge instruction sheet given to you by the facility where your surgery was performed. IF YOU HAVE DISABILITY OR FAMILY LEAVE FORMS, YOU MUST BRING THEM TO THE OFFICE FOR PROCESSING.   DO NOT GIVE THEM TO YOUR DOCTOR.  PAIN CONTROL  1. First take acetaminophen (Tylenol) AND/or ibuprofen (Advil) to control your pain after surgery.  Follow directions on package.  Taking acetaminophen (Tylenol) and/or ibuprofen (Advil) regularly after surgery will help to control your pain and lower the amount of prescription pain medication you may need.  You should not take more than 4,000 mg (4 grams) of acetaminophen (Tylenol) in 24 hours.  You should not take ibuprofen (Advil), aleve, motrin, naprosyn or other NSAIDS if you have a history of stomach ulcers or chronic kidney disease.  2. A prescription for pain medication may be given to you upon discharge.  Take your pain medication as prescribed, if you still have uncontrolled pain after taking acetaminophen (Tylenol) or ibuprofen (Advil). 3. Use ice packs to help control pain. 4. If you need a refill on your pain medication, please contact your pharmacy.  They will contact our office to request authorization. Prescriptions will not be filled after 5pm or on week-ends.  HOME MEDICATIONS 5. Take your usually prescribed medications unless otherwise directed.  DIET 6. You should follow a light diet the first few days after arrival home.  Be sure to include lots of fluids daily. Avoid fatty,  fried foods.   CONSTIPATION 7. It is common to experience some constipation after surgery and if you are taking pain medication.  Increasing fluid intake and taking a stool softener (such as Colace) will usually help or prevent this problem from occurring.  A mild laxative (Milk of Magnesia or Miralax) should be taken according to package instructions if there are no bowel movements after 48 hours.  WOUND/INCISION CARE 8. Most patients will experience some swelling and bruising in the area of the incisions.  Ice packs will help.  Swelling and bruising can take several days to resolve.  9. Unless discharge instructions indicate otherwise, follow guidelines below  a. STERI-STRIPS - you may remove your outer bandages 48 hours after surgery, and you may shower at that time.  You have steri-strips (small skin tapes) in place directly over the incision.  These strips should be left on the skin for 7-10 days.   b. DERMABOND/SKIN GLUE - you may shower in 24 hours.  The glue will flake off over the next 2-3 weeks. 10. Any sutures or staples will be removed at the office during your follow-up visit.  ACTIVITIES 11. You may resume regular (light) daily activities beginning the next day--such as daily self-care, walking, climbing stairs--gradually increasing activities as tolerated.  You may have sexual intercourse when it is comfortable.  Refrain from any heavy lifting or straining until approved by your doctor. a. You may drive when you are no longer taking prescription pain medication, you can comfortably wear a seatbelt, and you can safely maneuver your car and apply brakes.  FOLLOW-UP 12. You should see your doctor in the office for a follow-up appointment approximately 2-3 weeks after your surgery.  You should have been given your post-op/follow-up appointment when your surgery was scheduled.  If you did not receive a post-op/follow-up appointment, make sure that you call for this appointment within a day  or two after you arrive home to insure a convenient appointment time.   WHEN TO CALL YOUR DOCTOR: 1. Fever over 101.0 2. Inability to urinate 3. Continued bleeding from incision. 4. Increased pain, redness, or drainage from the incision. 5. Increasing abdominal pain  The clinic staff is available to answer your questions during regular business hours.  Please dont hesitate to call and ask to speak to one of the nurses for clinical concerns.  If you have a medical emergency, go to the nearest emergency room or call 911.  A surgeon from [facility] is always on call at the hospital. [address], [place], [place], [place]  [address] ? [address], [place], [place]   [address] [phone] ? [phone] ? FAX [phone]     Managing Your Pain After Surgery Without Opioids    Thank you for participating in our program to help patients manage their pain after surgery without opioids. This is part of our effort to provide you with the best care possible, without exposing you or your family to the risk that opioids pose.  What pain can I expect after surgery? You can expect to have some pain after surgery. This is normal. The pain is typically worse the day after surgery, and quickly begins to get better. Many studies have found that many patients are able to manage their pain after surgery with Over-the-Counter (OTC) medications such as Tylenol and Motrin. If you have a condition that does not allow you to take Tylenol or Motrin, notify your surgical team.  How will I manage my pain? The best strategy for controlling your pain after surgery is around the clock pain control with Tylenol (acetaminophen) and Motrin (ibuprofen or Advil). Alternating these medications with each other allows you to maximize your pain control. In addition to Tylenol and Motrin, you can use heating pads or ice packs on your incisions to help reduce your pain.  How will I alternate  your regular strength over-the-counter pain medication? You will take a dose of pain medication every three hours. ; Start by taking 650 mg of Tylenol (2 pills of 325 mg) ; 3 hours later take 600 mg of Motrin (3 pills of 200 mg) ; 3 hours after taking the Motrin take 650 mg of Tylenol ; 3 hours after that take 600 mg of Motrin.   - 1 -  See example - if your first dose of Tylenol is at 12:00 PM   12:00 PM Tylenol 650 mg (2 pills of 325 mg)  3:00 PM Motrin 600 mg (3 pills of 200 mg)  6:00 PM Tylenol 650 mg (2 pills of 325 mg)  9:00 PM Motrin 600 mg (3 pills of 200 mg)  Continue alternating every 3 hours   We recommend that you follow this schedule around-the-clock for at least 3 days after surgery, or until you feel that it is no longer needed. Use the table on the last page of this handout to keep track of the medications you are taking. Important: Do not take more than 3000mg  of Tylenol or 3200mg  of Motrin in a 24-hour period. Do not take ibuprofen/Motrin if you  have a history of bleeding stomach ulcers, severe kidney disease, &/or actively taking a blood thinner  What if I still have pain? If you have pain that is not controlled with the over-the-counter pain medications (Tylenol and Motrin or Advil) you might have what we call breakthrough pain. You will receive a prescription for a small amount of an opioid pain medication such as Oxycodone, Tramadol, or Tylenol with Codeine. Use these opioid pills in the first 24 hours after surgery if you have breakthrough pain. Do not take more than 1 pill every 4-6 hours.  If you still have uncontrolled pain after using all opioid pills, don't hesitate to call our staff using the number provided. We will help make sure you are managing your pain in the best way possible, and if necessary, we can provide a prescription for additional pain medication.   Day 1    Time  Name of Medication Number of pills taken  Amount of Acetaminophen  Pain  Level   Comments  AM PM       AM PM       AM PM       AM PM       AM PM       AM PM       AM PM       AM PM       Total Daily amount of Acetaminophen Do not take more than  3,000 mg per day      Day 2    Time  Name of Medication Number of pills taken  Amount of Acetaminophen  Pain Level   Comments  AM PM       AM PM       AM PM       AM PM       AM PM       AM PM       AM PM       AM PM       Total Daily amount of Acetaminophen Do not take more than  3,000 mg per day      Day 3    Time  Name of Medication Number of pills taken  Amount of Acetaminophen  Pain Level   Comments  AM PM       AM PM       AM PM       AM PM          AM PM       AM PM       AM PM       AM PM       Total Daily amount of Acetaminophen Do not take more than  3,000 mg per day      Day 4    Time  Name of Medication Number of pills taken  Amount of Acetaminophen  Pain Level   Comments  AM PM       AM PM       AM PM       AM PM       AM PM       AM PM       AM PM       AM PM       Total Daily amount of Acetaminophen Do not take more than  3,000 mg per day      Day 5    Time  Name of Medication Number of  pills taken  Amount of Acetaminophen  Pain Level   Comments  AM PM       AM PM       AM PM       AM PM       AM PM       AM PM       AM PM       AM PM       Total Daily amount of Acetaminophen Do not take more than  3,000 mg per day       Day 6    Time  Name of Medication Number of pills taken  Amount of Acetaminophen  Pain Level  Comments  AM PM       AM PM       AM PM       AM PM       AM PM       AM PM       AM PM       AM PM       Total Daily amount of Acetaminophen Do not take more than  3,000 mg per day      Day 7    Time  Name of Medication Number of pills taken  Amount of Acetaminophen  Pain Level   Comments  AM PM       AM PM       AM PM       AM PM       AM PM       AM PM       AM PM       AM PM         Total Daily amount of Acetaminophen Do not take more than  3,000 mg per day        For additional information about how and where to safely dispose of unused opioid medications - [employer]  Disclaimer: This document contains information and/or instructional materials adapted from [place] [facility] for the typical patient with your condition. It does not replace medical advice from your health care provider because your experience may differ from that of the typical patient. Talk to your health care provider if you have any questions about this document, your condition or your treatment plan. Adapted from [place]

## 2019-10-09 ENCOUNTER — Encounter (HOSPITAL_COMMUNITY): Payer: Self-pay

## 2019-10-09 ENCOUNTER — Emergency Department (HOSPITAL_COMMUNITY): Payer: Medicare Other

## 2019-10-09 ENCOUNTER — Other Ambulatory Visit: Payer: Self-pay

## 2019-10-09 ENCOUNTER — Inpatient Hospital Stay (HOSPITAL_COMMUNITY)
Admission: EM | Admit: 2019-10-09 | Discharge: 2019-10-19 | DRG: 390 | Disposition: A | Discharge status: Home / Self Care | Payer: Medicare Other | Attending: Surgery | Admitting: Surgery

## 2019-10-09 DIAGNOSIS — D373 Neoplasm of uncertain behavior of appendix: Secondary | ICD-10-CM

## 2019-10-09 DIAGNOSIS — Z885 Allergy status to narcotic agent status: Secondary | ICD-10-CM

## 2019-10-09 DIAGNOSIS — Z0189 Encounter for other specified special examinations: Secondary | ICD-10-CM

## 2019-10-09 DIAGNOSIS — K3580 Unspecified acute appendicitis: Secondary | ICD-10-CM

## 2019-10-09 DIAGNOSIS — K56609 Unspecified intestinal obstruction, unspecified as to partial versus complete obstruction: Secondary | ICD-10-CM | POA: Diagnosis present

## 2019-10-09 DIAGNOSIS — E7800 Pure hypercholesterolemia, unspecified: Secondary | ICD-10-CM | POA: Diagnosis present

## 2019-10-09 DIAGNOSIS — E440 Moderate protein-calorie malnutrition: Secondary | ICD-10-CM | POA: Insufficient documentation

## 2019-10-09 DIAGNOSIS — K913 Postprocedural intestinal obstruction, unspecified as to partial versus complete: Principal | ICD-10-CM | POA: Diagnosis present

## 2019-10-09 DIAGNOSIS — Z20822 Contact with and (suspected) exposure to covid-19: Secondary | ICD-10-CM | POA: Diagnosis present

## 2019-10-09 DIAGNOSIS — R0603 Acute respiratory distress: Secondary | ICD-10-CM

## 2019-10-09 DIAGNOSIS — Y836 Removal of other organ (partial) (total) as the cause of abnormal reaction of the patient, or of later complication, without mention of misadventure at the time of the procedure: Secondary | ICD-10-CM | POA: Diagnosis present

## 2019-10-09 DIAGNOSIS — E876 Hypokalemia: Secondary | ICD-10-CM | POA: Diagnosis present

## 2019-10-09 DIAGNOSIS — Z9049 Acquired absence of other specified parts of digestive tract: Secondary | ICD-10-CM

## 2019-10-09 DIAGNOSIS — Z4659 Encounter for fitting and adjustment of other gastrointestinal appliance and device: Secondary | ICD-10-CM

## 2019-10-09 DIAGNOSIS — Z79899 Other long term (current) drug therapy: Secondary | ICD-10-CM

## 2019-10-09 LAB — CBC
HCT: 48 % — ABNORMAL HIGH (ref 36.0–46.0)
Hemoglobin: 16 g/dL — ABNORMAL HIGH (ref 12.0–15.0)
MCH: 29.6 pg (ref 26.0–34.0)
MCHC: 33.3 g/dL (ref 30.0–36.0)
MCV: 88.7 fL (ref 80.0–100.0)
Platelets: 356 10*3/uL (ref 150–400)
RBC: 5.41 MIL/uL — ABNORMAL HIGH (ref 3.87–5.11)
RDW: 12.1 % (ref 11.5–15.5)
WBC: 8.7 10*3/uL (ref 4.0–10.5)
nRBC: 0 % (ref 0.0–0.2)

## 2019-10-09 LAB — COMPREHENSIVE METABOLIC PANEL
ALT: 15 U/L (ref 0–44)
AST: 23 U/L (ref 15–41)
Albumin: 4.8 g/dL (ref 3.5–5.0)
Alkaline Phosphatase: 41 U/L (ref 38–126)
Anion gap: 15 (ref 5–15)
BUN: 18 mg/dL (ref 8–23)
CO2: 28 mmol/L (ref 22–32)
Calcium: 10.2 mg/dL (ref 8.9–10.3)
Chloride: 89 mmol/L — ABNORMAL LOW (ref 98–111)
Creatinine, Ser: 0.75 mg/dL (ref 0.44–1.00)
GFR calc Af Amer: >60 mL/min (ref >60)
GFR calc non Af Amer: >60 mL/min (ref >60)
Glucose, Bld: 112 mg/dL — ABNORMAL HIGH (ref 70–99)
Potassium: 3 mmol/L — ABNORMAL LOW (ref 3.5–5.1)
Sodium: 132 mmol/L — ABNORMAL LOW (ref 135–145)
Total Bilirubin: 2.6 mg/dL — ABNORMAL HIGH (ref 0.3–1.2)
Total Protein: 8.7 g/dL — ABNORMAL HIGH (ref 6.5–8.1)

## 2019-10-09 LAB — LIPASE, BLOOD: Lipase: 29 U/L (ref 11–51)

## 2019-10-09 MED ORDER — SODIUM CHLORIDE 0.9% FLUSH
3.0000 mL | Freq: Once | INTRAVENOUS | Status: AC
  Administered 2019-10-09: 3 mL via INTRAVENOUS

## 2019-10-09 MED ORDER — SODIUM CHLORIDE 0.9 % IV BOLUS
500.0000 mL | Freq: Once | INTRAVENOUS | Status: AC
  Administered 2019-10-09: 500 mL via INTRAVENOUS

## 2019-10-09 MED ORDER — ONDANSETRON HCL 4 MG/2ML IJ SOLN
4.0000 mg | Freq: Once | INTRAMUSCULAR | Status: AC
  Administered 2019-10-09: 4 mg via INTRAVENOUS
  Filled 2019-10-09: qty 2

## 2019-10-09 NOTE — ED Provider Notes (Signed)
[facility] DEPT Provider Note   CSN: [id number] Arrival date & time: 10/09/19  1618     History Chief Complaint  Patient presents with  . Emesis  . Constipation    Kelly Harrington is a 74 y.o. female.  Patient is a 74 year old female who had a recent appendectomy on 4/20.  She was discharged in the hospital yesterday.  She said that she has had some ongoing nausea and vomiting since that time.  She has been taking Zofran without improvement in symptoms in fact she has vomited up her Zofran.  She also feels like her abdomen is more bloated.  She has not had a bowel movement since before the surgery.  She said she was passing some gas yesterday but none today.  No known fevers.  She denies any significant abdominal pain.  She has been taking Tylenol but no opioids.        Past Medical History:  Diagnosis Date  . High cholesterol     Patient Active Problem List   Diagnosis Date Noted  . Appendicitis, acute 10/06/2019  . Acute appendicitis 10/06/2019    Past Surgical History:  Procedure Laterality Date  . APPENDECTOMY    . LAPAROSCOPIC APPENDECTOMY N/A 10/06/2019   Procedure: APPENDECTOMY LAPAROSCOPIC;  Surgeon: Jovita Kussmaul, MD;  Location: [facility];  Service: General;  Laterality: N/A;  . ROTATOR CUFF REPAIR    . TRIGGER FINGER RELEASE       OB History   No obstetric history on file.     Family History  Problem Relation Age of Onset  . Heart failure Mother   . Hypertension Mother   . Heart failure Father   . Hypertension Father     Social History   Tobacco Use  . Smoking status: Never Smoker  . Smokeless tobacco: Never Used  Substance Use Topics  . Alcohol use: No  . Drug use: No    Home Medications Prior to Admission medications   Medication Sig Start Date End Date Taking? Authorizing Provider  acetaminophen (TYLENOL) 500 MG tablet Take 500-1,000 mg by mouth every 6 (six) hours as needed for moderate pain.   Yes  [provider]  atorvastatin (LIPITOR) 40 MG tablet Take 40 mg by mouth every Monday, Wednesday, and Friday. Take on MWF   Yes [provider]  Biotin 1 MG CAPS Take 1 capsule by mouth daily.   Yes [provider]  cetirizine (ZYRTEC) 10 MG tablet Take 10 mg by mouth daily as needed for allergies.   Yes [provider]  cholecalciferol (VITAMIN D3) 25 MCG (1000 UNIT) tablet Take 1,000 Units by mouth daily.   Yes [provider]  ibuprofen (ADVIL,MOTRIN) 200 MG tablet Take 200 mg by mouth every 6 (six) hours as needed for moderate pain.    Yes [provider]  ondansetron (ZOFRAN-ODT) 4 MG disintegrating tablet Take 1 tablet (4 mg total) by mouth every 6 (six) hours as needed for nausea. 10/08/19  Yes Saverio Danker, PA-C  oxyCODONE (OXY IR/ROXICODONE) 5 MG immediate release tablet Take 1 tablet (5 mg total) by mouth every 6 (six) hours as needed for moderate pain. 10/08/19   Saverio Danker, PA-C    Allergies    Codeine  Review of Systems   Review of Systems  Constitutional: Negative for chills, diaphoresis, fatigue and fever.  HENT: Negative for congestion, rhinorrhea and sneezing.   Eyes: Negative.   Respiratory: Negative for cough, chest tightness and shortness  of breath.   Cardiovascular: Negative for chest pain and leg swelling.  Gastrointestinal: Positive for abdominal distention, nausea and vomiting. Negative for abdominal pain, blood in stool and diarrhea.  Genitourinary: Negative for difficulty urinating, flank pain, frequency and hematuria.  Musculoskeletal: Negative for arthralgias and back pain.  Skin: Negative for rash.  Neurological: Negative for dizziness, speech difficulty, weakness, numbness and headaches.    Physical Exam Updated Vital Signs BP (!) 142/95   Pulse 89   Temp (!) 97.5 F (36.4 C) (Oral)   Resp 18   Ht 5\' 1"  (1.549 m)   Wt 57.2 kg   SpO2 99%   BMI 23.83 kg/m   Physical Exam Constitutional:       Appearance: She is well-developed.  HENT:     Head: Normocephalic and atraumatic.  Eyes:     Pupils: Pupils are equal, round, and reactive to light.  Cardiovascular:     Rate and Rhythm: Normal rate and regular rhythm.     Heart sounds: Normal heart sounds.  Pulmonary:     Effort: Pulmonary effort is normal. No respiratory distress.     Breath sounds: Normal breath sounds. No wheezing or rales.  Chest:     Chest wall: No tenderness.  Abdominal:     General: Bowel sounds are normal. There is distension.     Palpations: Abdomen is soft.     Tenderness: There is no abdominal tenderness. There is no guarding or rebound.     Comments: Well-healing laparoscopic incisions.  No drainage or signs of infection.  She has some mild distention and some mildly diminished bowel sounds.  Musculoskeletal:        General: Normal range of motion.     Cervical back: Normal range of motion and neck supple.  Lymphadenopathy:     Cervical: No cervical adenopathy.  Skin:    General: Skin is warm and dry.     Findings: No rash.  Neurological:     Mental Status: She is alert and oriented to person, place, and time.     ED Results / Procedures / Treatments   Labs (all labs ordered are listed, but only abnormal results are displayed) Labs Reviewed  COMPREHENSIVE METABOLIC PANEL - Abnormal; Notable for the following components:      Result Value   Sodium 132 (*)    Potassium 3.0 (*)    Chloride 89 (*)    Glucose, Bld 112 (*)    Total Protein 8.7 (*)    Total Bilirubin 2.6 (*)    All other components within normal limits  CBC - Abnormal; Notable for the following components:   RBC 5.41 (*)    Hemoglobin 16.0 (*)    HCT 48.0 (*)    All other components within normal limits  RESPIRATORY PANEL BY RT PCR (FLU A&B, COVID)  LIPASE, BLOOD  URINALYSIS, ROUTINE W REFLEX MICROSCOPIC    EKG None  Radiology CT Abdomen Pelvis Wo Contrast  Result Date: 10/09/2019 CLINICAL DATA:  74 year old female  with abdominal distension. Evaluate for small bowel obstruction. Status post recent appendectomy. EXAM: CT ABDOMEN AND PELVIS WITHOUT CONTRAST TECHNIQUE: Multidetector CT imaging of the abdomen and pelvis was performed following the standard protocol without IV contrast. COMPARISON:  Abdominal radiograph dated 10/09/2019 and CT abdomen pelvis dated 10/05/2019. FINDINGS: Evaluation of this exam is limited in the absence of intravenous contrast. Lower chest: The visualized lung bases are clear. Coronary vascular calcifications noted. No intra-abdominal free air. Small free fluid in  the pelvis. Hepatobiliary: Mild fatty liver. No intrahepatic biliary ductal dilatation. Probable small amount of gallbladder sludge versus vicarious excretion of recently administered contrast. No pericholecystic fluid or evidence of acute cholecystitis by CT. Pancreas: Unremarkable. No pancreatic ductal dilatation or surrounding inflammatory changes. Spleen: The spleen is small. Adrenals/Urinary Tract: The adrenal glands unremarkable. There is no hydronephrosis or nephrolithiasis on either side. The visualized ureters appear unremarkable. Small amount of air within the urinary bladder, likely related to recent instrumentation or catheterization. Clinical correlation is recommended. Stomach/Bowel: The stomach is moderately distended with fluid content. Multiple dilated fluid-filled loops of small bowel in the mid to upper abdomen measure up to 3.5 cm in caliber. The distal small bowel and terminal ileum are collapsed. A transition is noted in the pelvis (51/2 and 70/5) most consistent with a small-bowel obstruction. The colon is unremarkable. Appendectomy clips. Vascular/Lymphatic: Minimal aortoiliac atherosclerotic disease. The IVC is unremarkable. No portal venous gas. There is no adenopathy. Reproductive: The uterus and ovaries are grossly unremarkable. Other: Induration of the periumbilical fat related to recent port placement. No fluid  collection. Musculoskeletal: Degenerative changes and disc desiccation at L4-5. No acute osseous pathology. IMPRESSION: Small-bowel obstruction with transition in the lower abdomen/pelvis. Clinical correlation and follow-up recommended. Electronically Signed   By: Anner Crete M.D.   On: 10/09/2019 21:23   DG ABD ACUTE 2+V W 1V CHEST  Result Date: 10/09/2019 CLINICAL DATA:  74 year old female with vomiting and bloating. EXAM: DG ABDOMEN ACUTE W/ 1V CHEST COMPARISON:  Abdominal radiograph dated 10/05/2019 and CT dated 10/05/2019 FINDINGS: The lungs are clear. There is no pleural effusion or pneumothorax. The cardiac silhouette is within normal limits. Mild atherosclerotic calcification of the aortic arch. Mildly dilated loop of small bowel in the left upper abdomen measures up to 4 cm in diameter and may represent postoperative ileus versus obstruction. Clinical correlation is recommended. No free air or radiopaque calculi identified. Appendectomy clips. The osseous structures and soft tissues are unremarkable. A 16 mm rounded calcific density in the left upper quadrant likely in the left breast. IMPRESSION: 1. No acute cardiopulmonary process. 2. Mildly dilated loop of small bowel in the left upper abdomen may represent postoperative ileus versus obstruction. Electronically Signed   By: Anner Crete M.D.   On: 10/09/2019 20:02    Procedures Procedures (including critical care time)  Medications Ordered in ED Medications  sodium chloride flush (NS) 0.9 % injection 3 mL (3 mLs Intravenous Given 10/09/19 1933)  sodium chloride 0.9 % bolus 500 mL (0 mLs Intravenous Stopped 10/09/19 2035)  ondansetron (ZOFRAN) injection 4 mg (4 mg Intravenous Given 10/09/19 1940)    ED Course  I have reviewed the triage vital signs and the nursing notes.  Pertinent labs & imaging results that were available during my care of the patient were reviewed by me and considered in my medical decision making (see chart  for details).    MDM Rules/Calculators/A&P                      Patient is a 74 year old female who had a recent appendectomy.  She came in today with persistent nausea and vomiting with abdominal distention.  Her labs show a mildly low sodium and potassium.  CT scan shows evidence of small bowel obstruction.  I spoke with Dr. Lucia Gaskins who will admit the patient for further treatment.  NG tube was placed. Final Clinical Impression(s) / ED Diagnoses Final diagnoses:  SBO (small bowel obstruction) ([place])  Rx / DC Orders ED Discharge Orders    None       Malvin Johns, MD 10/09/19 2159

## 2019-10-09 NOTE — ED Notes (Signed)
Patient back from x-ray 

## 2019-10-09 NOTE — ED Notes (Signed)
Patient transported to CT 

## 2019-10-09 NOTE — H&P (Signed)
Re:   Kelly Harrington DOB:   Sep 01, 1945 MRN:   [id number]  Chief Complaint Abdominal pain, nausea, vomiting  ASSESEMENT AND PLAN: 1.  SBO vs ileus  Plan - NGT, NPO, repeat PE and x-rays  2.  S/P appendectomy - 10/06/2019 - [facility] 3. Jehovah's witness  Chief Complaint  Patient presents with  . Emesis  . Constipation   PHYSICIAN REQUESTING CONSULTATION:  Kelly Chester, MD, [facility]  HISTORY OF PRESENT ILLNESS: Kelly Harrington is a 74 y.o. (DOB: April 10, 1946)  AA female whose primary care physician is Kelly Harrington, KellyKelly Sa, MD. She is accompanied by her husband, Kelly Harrington.   She underwent a lap appendectomy on 10/06/2019 by Kelly Harrington and was discharged on 10/08/2019.  It sounds like she never really opened up, in that she was still nauseated when she left the [facility]. She represented with nauseas and vomiting and feels more bloated.  In addition to her appendectomy, she had an exploration for an ectopic pregnancy many years ago.  She has no history of stomach, liver, or colon disease.   Her abdominal CT scan today shows a SBO with a transition point in the lower abdomen/pelvis.    Past Medical History:  Diagnosis Date  . High cholesterol       Past Surgical History:  Procedure Laterality Date  . APPENDECTOMY    . LAPAROSCOPIC APPENDECTOMY N/A 10/06/2019   Procedure: APPENDECTOMY LAPAROSCOPIC;  Surgeon: Kelly Kussmaul, MD;  Location: [facility];  Service: General;  Laterality: N/A;  . ROTATOR CUFF REPAIR    . TRIGGER FINGER RELEASE        No current facility-administered medications for this encounter.   Current Outpatient Medications  Medication Sig Dispense Refill  . acetaminophen (TYLENOL) 500 MG tablet Take 500-1,000 mg by mouth every 6 (six) hours as needed for moderate pain.    Marland Kitchen atorvastatin (LIPITOR) 40 MG tablet Take 40 mg by mouth every Monday, Wednesday, and Friday. Take on MWF    . Biotin 1 MG CAPS Take 1 capsule by mouth daily.    . cetirizine (ZYRTEC) 10 MG tablet  Take 10 mg by mouth daily as needed for allergies.    . cholecalciferol (VITAMIN D3) 25 MCG (1000 UNIT) tablet Take 1,000 Units by mouth daily.    Marland Kitchen ibuprofen (ADVIL,MOTRIN) 200 MG tablet Take 200 mg by mouth every 6 (six) hours as needed for moderate pain.     Marland Kitchen ondansetron (ZOFRAN-ODT) 4 MG disintegrating tablet Take 1 tablet (4 mg total) by mouth every 6 (six) hours as needed for nausea. 20 tablet 0  . oxyCODONE (OXY IR/ROXICODONE) 5 MG immediate release tablet Take 1 tablet (5 mg total) by mouth every 6 (six) hours as needed for moderate pain. 10 tablet 0      Allergies  Allergen Reactions  . Codeine     REVIEW OF SYSTEMS: Skin:  No history of rash.  No history of abnormal moles. Infection:  No history of hepatitis or HIV.  No history of MRSA. Neurologic:  No history of stroke.  No history of seizure.  No history of headaches. Cardiac:  No history of hypertension. No history of heart disease.  No history of prior cardiac catheterization.  No history of seeing a cardiologist. Pulmonary:  Does not smoke cigarettes.  No asthma or bronchitis.  No OSA/CPAP.  Endocrine:  No diabetes. No thyroid disease. Gastrointestinal:  See HPI Urologic:  No history of kidney stones.  No history of bladder infections. Musculoskeletal:  Left  rotator cuff repair in 2005, left and right rotator cuff repair in 2011. Hematologic:  No bleeding disorder.  No history of anemia.  Not anticoagulated. Psycho-social:  The patient is oriented.   The patient has no obvious psychologic or social impairment to understanding our conversation and plan.  SOCIAL and FAMILY HISTORY: Married.  Husband Kelly Harrington at bedside.      No children.  Jehovah's Witno[place]  The patient has no family history of bowel obstructions.  PHYSICAL EXAM: BP (!) 142/95   Pulse 89   Temp (!) 97.5 F (36.4 C) (Oral)   Resp 18   Ht 5\' 1"  (1.549 m)   Wt 57.2 kg   SpO2 99%   BMI 23.83 kg/m   General: Small AA F who is alert.  Skin:   Inspection and palpation - no mass or rash. Eyes:  Conjunctiva and lids unremarkable.            Pupils are equal Ears, Nose, Mouth, and Throat:  Ears and nose unremarkable            Lips and teeth are unremarable. Neck: Supple. No mass, trachea midline.  No thyroid mass. Lymph Nodes:  No supraclavicular, cervical, or inguinal nodes. Lungs: Normal respiratory effort.  Clear to auscultation and symmetric breath sounds. Heart:  Palpation of the heart is normal.            Auscultation: RRR. No murmur or rub.  Abdomen: Mildly distended.  Has BS.  Has 3 incisions which look okay.  The largest incision is in the infraumbilical area.  There is no tenderness. Rectal: Not done. Musculoskeletal:  Good muscle strength and ROM  in upper and lower extremities.  Neurologic:  Grossly intact to motor and sensory function. Psychiatric: Normal judgement and insight. Behavior is normal.            Oriented to time, person, place.   DATA REVIEWED, COUNSELING AND COORDINATION OF CARE: Epic notes reviewed. I have personally seen and evaluated the patient, evaluated laboratory and imaging results, formulated the assessment and plan and placed orders. This requires high medical decision making. Total time spent with patient and charting: 50 minutes This is an admission  24 observation  consultation.  Kelly Overall, MD,  [facility], [address]  [place], [place]    [place] Phone:  [phone] FAX:  [phone]

## 2019-10-09 NOTE — ED Triage Notes (Addendum)
Patient reports no BM since 10/05/19. Patient states she had an appendectomy on 10/06/19. Patient also c/o N/V. Patient states she had Miralax x 3 doses with no relief.

## 2019-10-10 ENCOUNTER — Inpatient Hospital Stay (HOSPITAL_COMMUNITY): Payer: Medicare Other

## 2019-10-10 DIAGNOSIS — Z20822 Contact with and (suspected) exposure to covid-19: Secondary | ICD-10-CM | POA: Diagnosis present

## 2019-10-10 DIAGNOSIS — E7800 Pure hypercholesterolemia, unspecified: Secondary | ICD-10-CM | POA: Diagnosis present

## 2019-10-10 DIAGNOSIS — E876 Hypokalemia: Secondary | ICD-10-CM | POA: Diagnosis present

## 2019-10-10 DIAGNOSIS — Z885 Allergy status to narcotic agent status: Secondary | ICD-10-CM | POA: Diagnosis not present

## 2019-10-10 DIAGNOSIS — K56609 Unspecified intestinal obstruction, unspecified as to partial versus complete obstruction: Secondary | ICD-10-CM | POA: Diagnosis present

## 2019-10-10 DIAGNOSIS — R112 Nausea with vomiting, unspecified: Secondary | ICD-10-CM | POA: Diagnosis present

## 2019-10-10 DIAGNOSIS — Z9049 Acquired absence of other specified parts of digestive tract: Secondary | ICD-10-CM | POA: Diagnosis not present

## 2019-10-10 DIAGNOSIS — K913 Postprocedural intestinal obstruction, unspecified as to partial versus complete: Secondary | ICD-10-CM | POA: Diagnosis present

## 2019-10-10 DIAGNOSIS — Z79899 Other long term (current) drug therapy: Secondary | ICD-10-CM | POA: Diagnosis not present

## 2019-10-10 DIAGNOSIS — Y836 Removal of other organ (partial) (total) as the cause of abnormal reaction of the patient, or of later complication, without mention of misadventure at the time of the procedure: Secondary | ICD-10-CM | POA: Diagnosis present

## 2019-10-10 LAB — BASIC METABOLIC PANEL
Anion gap: 15 (ref 5–15)
BUN: 21 mg/dL (ref 8–23)
CO2: 28 mmol/L (ref 22–32)
Calcium: 9.7 mg/dL (ref 8.9–10.3)
Chloride: 90 mmol/L — ABNORMAL LOW (ref 98–111)
Creatinine, Ser: 0.81 mg/dL (ref 0.44–1.00)
GFR calc Af Amer: >60 mL/min (ref >60)
GFR calc non Af Amer: >60 mL/min (ref >60)
Glucose, Bld: 124 mg/dL — ABNORMAL HIGH (ref 70–99)
Potassium: 2.6 mmol/L — CL (ref 3.5–5.1)
Sodium: 133 mmol/L — ABNORMAL LOW (ref 135–145)

## 2019-10-10 LAB — CBC
HCT: 41.7 % (ref 36.0–46.0)
Hemoglobin: 14.2 g/dL (ref 12.0–15.0)
MCH: 30 pg (ref 26.0–34.0)
MCHC: 34.1 g/dL (ref 30.0–36.0)
MCV: 88.2 fL (ref 80.0–100.0)
Platelets: 346 10*3/uL (ref 150–400)
RBC: 4.73 MIL/uL (ref 3.87–5.11)
RDW: 11.9 % (ref 11.5–15.5)
WBC: 8.1 10*3/uL (ref 4.0–10.5)
nRBC: 0 % (ref 0.0–0.2)

## 2019-10-10 LAB — RESPIRATORY PANEL BY RT PCR (FLU A&B, COVID)
Influenza A by PCR: NEGATIVE
Influenza B by PCR: NEGATIVE
SARS Coronavirus 2 by RT PCR: NEGATIVE

## 2019-10-10 MED ORDER — HEPARIN SODIUM (PORCINE) 5000 UNIT/ML IJ SOLN
5000.0000 [IU] | Freq: Three times a day (TID) | INTRAMUSCULAR | Status: DC
  Administered 2019-10-10 – 2019-10-19 (×29): 5000 [IU] via SUBCUTANEOUS
  Filled 2019-10-10 (×29): qty 1

## 2019-10-10 MED ORDER — ONDANSETRON 4 MG PO TBDP: 4.0000 mg | ORAL_TABLET | Freq: Four times a day (QID) | ORAL | Status: DC | PRN

## 2019-10-10 MED ORDER — KCL IN DEXTROSE-NACL 20-5-0.45 MEQ/L-%-% IV SOLN
INTRAVENOUS | Status: AC
  Filled 2019-10-10 (×8): qty 1000

## 2019-10-10 MED ORDER — ONDANSETRON HCL 4 MG/2ML IJ SOLN
4.0000 mg | Freq: Four times a day (QID) | INTRAMUSCULAR | Status: DC | PRN
  Administered 2019-10-12 – 2019-10-13 (×2): 4 mg via INTRAVENOUS
  Filled 2019-10-10 (×2): qty 2

## 2019-10-10 MED ORDER — DIATRIZOATE MEGLUMINE & SODIUM 66-10 % PO SOLN
90.0000 mL | Freq: Once | ORAL | Status: AC
  Administered 2019-10-10: 90 mL via NASOGASTRIC
  Filled 2019-10-10: qty 90

## 2019-10-10 MED ORDER — MORPHINE SULFATE (PF) 2 MG/ML IV SOLN: 1.0000 mg | INTRAVENOUS | Status: DC | PRN

## 2019-10-10 MED ORDER — PHENOL 1.4 % MT LIQD
1.0000 | OROMUCOSAL | Status: DC | PRN
  Administered 2019-10-10 (×2): 1 via OROMUCOSAL
  Filled 2019-10-10: qty 177

## 2019-10-10 MED ORDER — POTASSIUM CHLORIDE 10 MEQ/100ML IV SOLN
10.0000 meq | INTRAVENOUS | Status: AC
  Administered 2019-10-10 (×4): 10 meq via INTRAVENOUS
  Filled 2019-10-10 (×4): qty 100

## 2019-10-10 NOTE — Progress Notes (Signed)
[facility] Office:  [phone] General Surgery Progress Note   LOS: 0 days  POD -     Assessment and Plan: 1.  SBO vs ileus  KUB appears to show persistent SBO  Will do SB protocol, encourage ambulation  2.  S/P lap appendectomy - 10/06/2019 - [facility] 3.  Jehovah's Witness 4.  DVT prophylaxis - SQ Heparin   Active Problems:   Small bowel obstruction (HCC)  Subjective:  Feels about the same.  No BM.  But in no pain.  Objective:   Vitals:   10/10/19 0132 10/10/19 0436  BP: (!) 140/97 131/82  Pulse: 91 83  Resp: 16 20  Temp: 98.4 F (36.9 C) 98.6 F (37 C)  SpO2: 98% 98%     Intake/Output from previous day:  04/23 0701 - 04/24 0700 In: 893 [I.V.:385.3; IV Piggyback:507.8] Out: -   Intake/Output this shift:  Total I/O In: 817.3 [P.O.:60; I.V.:520; IV Piggyback:237.3] Out: 300 [Emesis/NG output:300]   Physical Exam:   General: AA F who is alert and oriented.    HEENT: Normal. Pupils equal. .   Lungs: Clear   Abdomen: Soft.  Rare BS.  No tenderness.  Wounds look good.   Lab Results:    Recent Labs    10/09/19 1931 10/10/19 0336  WBC 8.7 8.1  HGB 16.0* 14.2  HCT 48.0* 41.7  PLT 356 346    BMET   Recent Labs    10/09/19 1931 10/10/19 0336  NA 132* 133*  K 3.0* 2.6*  CL 89* 90*  CO2 28 28  GLUCOSE 112* 124*  BUN 18 21  CREATININE 0.75 0.81  CALCIUM 10.2 9.7    PT/INR  No results for input(s): LABPROT, INR in the last 72 hours.  ABG  No results for input(s): PHART, HCO3 in the last 72 hours.  Invalid input(s): PCO2, PO2   Studies/Results:  CT Abdomen Pelvis Wo Contrast  Result Date: 10/09/2019 CLINICAL DATA:  74 year old female with abdominal distension. Evaluate for small bowel obstruction. Status post recent appendectomy. EXAM: CT ABDOMEN AND PELVIS WITHOUT CONTRAST TECHNIQUE: Multidetector CT imaging of the abdomen and pelvis was performed following the standard protocol without IV contrast. COMPARISON:  Abdominal radiograph  dated 10/09/2019 and CT abdomen pelvis dated 10/05/2019. FINDINGS: Evaluation of this exam is limited in the absence of intravenous contrast. Lower chest: The visualized lung bases are clear. Coronary vascular calcifications noted. No intra-abdominal free air. Small free fluid in the pelvis. Hepatobiliary: Mild fatty liver. No intrahepatic biliary ductal dilatation. Probable small amount of gallbladder sludge versus vicarious excretion of recently administered contrast. No pericholecystic fluid or evidence of acute cholecystitis by CT. Pancreas: Unremarkable. No pancreatic ductal dilatation or surrounding inflammatory changes. Spleen: The spleen is small. Adrenals/Urinary Tract: The adrenal glands unremarkable. There is no hydronephrosis or nephrolithiasis on either side. The visualized ureters appear unremarkable. Small amount of air within the urinary bladder, likely related to recent instrumentation or catheterization. Clinical correlation is recommended. Stomach/Bowel: The stomach is moderately distended with fluid content. Multiple dilated fluid-filled loops of small bowel in the mid to upper abdomen measure up to 3.5 cm in caliber. The distal small bowel and terminal ileum are collapsed. A transition is noted in the pelvis (51/2 and 70/5) most consistent with a small-bowel obstruction. The colon is unremarkable. Appendectomy clips. Vascular/Lymphatic: Minimal aortoiliac atherosclerotic disease. The IVC is unremarkable. No portal venous gas. There is no adenopathy. Reproductive: The uterus and ovaries are grossly unremarkable. Other: Induration of the periumbilical  fat related to recent port placement. No fluid collection. Musculoskeletal: Degenerative changes and disc desiccation at L4-5. No acute osseous pathology. IMPRESSION: Small-bowel obstruction with transition in the lower abdomen/pelvis. Clinical correlation and follow-up recommended. Electronically Signed   By: Anner Crete M.D.   On: 10/09/2019  21:23   DG Abd 2 Views  Result Date: 10/10/2019 CLINICAL DATA:  74 year old female with history of small-bowel obstruction. EXAM: ABDOMEN - 2 VIEW COMPARISON:  Abdominal radiograph 10/09/2019. FINDINGS: Nasogastric tube with tip in the body of the stomach and side port distal to the gastroesophageal junction. Multiple air-fluid levels are noted on the upright projection. Mildly dilated loop of gas-filled small bowel in the left upper quadrant measuring 4 cm in diameter. Small amount of gas and stool noted throughout the colon including the distal rectum. No pneumoperitoneum. Surgical clips project over the right-side of the abdomen. IMPRESSION: 1. Abnormal bowel gas pattern favored to reflect partial small bowel obstruction, as above. 2. Support apparatus, as above. 3. No pneumoperitoneum. Electronically Signed   By: Vinnie Langton M.D.   On: 10/10/2019 08:59   DG ABD ACUTE 2+V W 1V CHEST  Result Date: 10/09/2019 CLINICAL DATA:  74 year old female with vomiting and bloating. EXAM: DG ABDOMEN ACUTE W/ 1V CHEST COMPARISON:  Abdominal radiograph dated 10/05/2019 and CT dated 10/05/2019 FINDINGS: The lungs are clear. There is no pleural effusion or pneumothorax. The cardiac silhouette is within normal limits. Mild atherosclerotic calcification of the aortic arch. Mildly dilated loop of small bowel in the left upper abdomen measures up to 4 cm in diameter and may represent postoperative ileus versus obstruction. Clinical correlation is recommended. No free air or radiopaque calculi identified. Appendectomy clips. The osseous structures and soft tissues are unremarkable. A 16 mm rounded calcific density in the left upper quadrant likely in the left breast. IMPRESSION: 1. No acute cardiopulmonary process. 2. Mildly dilated loop of small bowel in the left upper abdomen may represent postoperative ileus versus obstruction. Electronically Signed   By: Anner Crete M.D.   On: 10/09/2019 20:02   DG Abd Portable  1 View  Result Date: 10/09/2019 CLINICAL DATA:  Nasogastric tube placement. EXAM: PORTABLE ABDOMEN - 1 VIEW COMPARISON:  October 09, 2019 (7:50 p.m.) FINDINGS: Since the prior study there has been interval placement of a nasogastric tube. Its distal tip is seen overlying the expected region of the body of the stomach. Persistently dilated small bowel loops are seen overlying the mid left abdomen. These are unchanged in caliber when compared to the prior exam. No radio-opaque calculi or other significant radiographic abnormality are seen. Radiopaque surgical clips are again seen overlying the mid right abdomen. IMPRESSION: 1. Nasogastric tube positioning, as described above. 2. Persistently dilated small bowel loops, which may reflect a small bowel obstruction versus ileus. Electronically Signed   By: Virgina Norfolk M.D.   On: 10/09/2019 23:46     Anti-infectives:   Anti-infectives (From admission, onward)   None      Alphonsa Overall, MD, [facility] Office: [phone] 10/10/2019

## 2019-10-10 NOTE — Plan of Care (Signed)
  Problem: Education: Goal: Knowledge of General Education information will improve Description: Including pain rating scale, medication(s)/side effects and non-pharmacologic comfort measures Outcome: Progressing   Problem: Coping: Goal: Level of anxiety will decrease Outcome: Progressing   Problem: Pain Managment: Goal: General experience of comfort will improve Outcome: Progressing   

## 2019-10-10 NOTE — Plan of Care (Signed)
?  Problem: Clinical Measurements: ?Goal: Respiratory complications will improve ?Outcome: Progressing ?  ?Problem: Clinical Measurements: ?Goal: Cardiovascular complication will be avoided ?Outcome: Progressing ?  ?Problem: Nutrition: ?Goal: Adequate nutrition will be maintained ?Outcome: Progressing ?  ?Problem: Coping: ?Goal: Level of anxiety will decrease ?Outcome: Progressing ?  ?

## 2019-10-10 NOTE — Progress Notes (Signed)
CRITICAL VALUE ALERT  Critical Value: Potassium-2.6  Date & Time Notied: 10/10/2019 at 04:40  Provider Notified:Dr. Lucia Gaskins  Orders Received/Actions taken: see order.

## 2019-10-11 ENCOUNTER — Inpatient Hospital Stay (HOSPITAL_COMMUNITY): Payer: Medicare Other

## 2019-10-11 LAB — BASIC METABOLIC PANEL
Anion gap: 14 (ref 5–15)
BUN: 13 mg/dL (ref 8–23)
CO2: 29 mmol/L (ref 22–32)
Calcium: 9.5 mg/dL (ref 8.9–10.3)
Chloride: 91 mmol/L — ABNORMAL LOW (ref 98–111)
Creatinine, Ser: 0.61 mg/dL (ref 0.44–1.00)
GFR calc Af Amer: >60 mL/min (ref >60)
GFR calc non Af Amer: >60 mL/min (ref >60)
Glucose, Bld: 113 mg/dL — ABNORMAL HIGH (ref 70–99)
Potassium: 2.7 mmol/L — CL (ref 3.5–5.1)
Sodium: 134 mmol/L — ABNORMAL LOW (ref 135–145)

## 2019-10-11 MED ORDER — PANTOPRAZOLE SODIUM 40 MG IV SOLR
40.0000 mg | Freq: Two times a day (BID) | INTRAVENOUS | Status: DC
  Administered 2019-10-11 – 2019-10-17 (×13): 40 mg via INTRAVENOUS
  Filled 2019-10-11 (×13): qty 40

## 2019-10-11 MED ORDER — IPRATROPIUM-ALBUTEROL 0.5-2.5 (3) MG/3ML IN SOLN
3.0000 mL | RESPIRATORY_TRACT | Status: AC
  Administered 2019-10-11: 3 mL via RESPIRATORY_TRACT
  Filled 2019-10-11: qty 3

## 2019-10-11 MED ORDER — POTASSIUM CHLORIDE 10 MEQ/100ML IV SOLN
10.0000 meq | INTRAVENOUS | Status: AC
  Administered 2019-10-11 (×4): 10 meq via INTRAVENOUS
  Filled 2019-10-11 (×3): qty 100

## 2019-10-11 NOTE — Progress Notes (Signed)
Received call back from Dr. Lucia Gaskins ([facility] on-call), orders received for IV protonix and a breathing treatment, fluids are to be continued and labs will be rechecked in the am, husband spending the night in room with patient, will continue to monitor.

## 2019-10-11 NOTE — Significant Event (Signed)
Rapid Response Event Note  Overview: Time Called: 2005 Arrival Time: 2010 Event Type: Respiratory, sore throat   Initial Focused Assessment: Nurse called with concerns of patient having a sore throat and change in breath sounds, patient feeling generally unwell. Floor nurse states that she just wanted an extra set of eyes on her patient. Arrived to floor and found patient alert and oriented in bed, slightly tachypneic, but does not appear to be in respiratory distress. Patient does have a nasogastric tube in place which she states is causing discomfort.    Interventions: Placed patient on rapid response monitor. Patient sinus tach with HR 100-110's and a brief episode of a heart rate in the 130's with readjustment in bed which quickly settled back into the 110's. Blood pressure stable 148/94 (105). RR 22, even and unlabored on room air with oxygen saturation 94-97%. Upon auscultation patient does sounds wheezy with crackles in bases. Chest xray ordered. Patient had IV fluids with potassium running @ 125.   Plan of Care (if not transferred): Chest xray ordered. IV fluids held for now, asked the nurse to address IV fluids with the MD to ensure patient is not becoming fluid volume overloaded. Nurse states that patient has a slightly higher heart rate as patient was previously running 90's in sinus rhythm. Nurse states that patient has suffered electrolyte imbalances with low serum potassium. Asked nurse to speak with the doctor to redraw CMP to assess for any possible electrolyte imbalance and a breathing treatment as needed for intermittent shortness of breath.    Event Summary: Name of Physician Notified: still attempting to notify GI; Gerald Stabs, RN has paged twice      at    Outcome: Stayed in room and stabalized  Event End Time: 2025  Clarene Critchley

## 2019-10-11 NOTE — Progress Notes (Signed)
Rapid response called for patient c/o sore throat and dyspnea.  Saturations 95 on ra, HR 104.  Crackes noted in lung fields.  CXR ordered by rapid response RN.

## 2019-10-11 NOTE — Plan of Care (Signed)

## 2019-10-11 NOTE — Progress Notes (Deleted)
Received call back from Dr. Lucia Gaskins, orders received for IV protonix and a breathing treatment, fluids are to be continued and labs will be rechecked in the am, husband spending the night in room with patient, will continue to monitor.

## 2019-10-11 NOTE — Progress Notes (Signed)
CRITICAL VALUE ALERT  Critical Value:  Potassium 2.7  Date & Time Notied:  10/11/2019 at [place]  Provider Notified: text message sent to on-call provider via amion  Orders Received/Actions taken: awaiting orders at this time

## 2019-10-11 NOTE — Progress Notes (Addendum)
[facility]:  [phone] General Surgery Progress Note   LOS: 1 day  POD -     Assessment and Plan: 1.  SBO vs ileus  SB protocol - appears to show persistent SBO, AM KUB pending  For repeat KUB today - still no bowel function  Reviewed films with Dr. Laqueta Carina - there is some contrast in the colon.  Will keep NPO and repeat films in AM. 2.  S/P lap appendectomy - 10/06/2019 - Marlou Starks 3.  [employer] 4.  Hypokalemia  K+ - 2.7 - 10/11/2019  Continue to replace K+ 5.  DVT prophylaxis - SQ Heparin   Active Problems:   Small bowel obstruction (HCC)  Subjective:  Feels about the same.  No BM.  But in no pain.  Husband at bedside.  Objective:   Vitals:   10/10/19 2032 10/11/19 0506  BP: 130/77 (!) 139/96  Pulse: 88 92  Resp: 20 17  Temp: 99.5 F (37.5 C) 98.4 F (36.9 C)  SpO2: 98% 98%     Intake/Output from previous day:  04/24 0701 - 04/25 0700 In: 2885 [P.O.:210; I.V.:2437.6; IV Piggyback:237.3] Out: 2850 [Urine:600; Emesis/NG output:2250]  Intake/Output this shift:  No intake/output data recorded.   Physical Exam:   General: AA F who is alert and oriented.    HEENT: Normal. Pupils equal. .   Lungs: Clear   Abdomen: Mild distention.  Rare BS.  No tenderness.  Wounds look good.   Lab Results:    Recent Labs    10/09/19 1931 10/10/19 0336  WBC 8.7 8.1  HGB 16.0* 14.2  HCT 48.0* 41.7  PLT 356 346    BMET   Recent Labs    10/10/19 0336 10/11/19 0322  NA 133* 134*  K 2.6* 2.7*  CL 90* 91*  CO2 28 29  GLUCOSE 124* 113*  BUN 21 13  CREATININE 0.81 0.61  CALCIUM 9.7 9.5    PT/INR  No results for input(s): LABPROT, INR in the last 72 hours.  ABG  No results for input(s): PHART, HCO3 in the last 72 hours.  Invalid input(s): PCO2, PO2   Studies/Results:  CT Abdomen Pelvis Wo Contrast  Result Date: 10/09/2019 CLINICAL DATA:  74 year old female with abdominal distension. Evaluate for small bowel obstruction. Status post  recent appendectomy. EXAM: CT ABDOMEN AND PELVIS WITHOUT CONTRAST TECHNIQUE: Multidetector CT imaging of the abdomen and pelvis was performed following the standard protocol without IV contrast. COMPARISON:  Abdominal radiograph dated 10/09/2019 and CT abdomen pelvis dated 10/05/2019. FINDINGS: Evaluation of this exam is limited in the absence of intravenous contrast. Lower chest: The visualized lung bases are clear. Coronary vascular calcifications noted. No intra-abdominal free air. Small free fluid in the pelvis. Hepatobiliary: Mild fatty liver. No intrahepatic biliary ductal dilatation. Probable small amount of gallbladder sludge versus vicarious excretion of recently administered contrast. No pericholecystic fluid or evidence of acute cholecystitis by CT. Pancreas: Unremarkable. No pancreatic ductal dilatation or surrounding inflammatory changes. Spleen: The spleen is small. Adrenals/Urinary Tract: The adrenal glands unremarkable. There is no hydronephrosis or nephrolithiasis on either side. The visualized ureters appear unremarkable. Small amount of air within the urinary bladder, likely related to recent instrumentation or catheterization. Clinical correlation is recommended. Stomach/Bowel: The stomach is moderately distended with fluid content. Multiple dilated fluid-filled loops of small bowel in the mid to upper abdomen measure up to 3.5 cm in caliber. The distal small bowel and terminal ileum are collapsed. A transition is noted in the pelvis (51/2  and 70/5) most consistent with a small-bowel obstruction. The colon is unremarkable. Appendectomy clips. Vascular/Lymphatic: Minimal aortoiliac atherosclerotic disease. The IVC is unremarkable. No portal venous gas. There is no adenopathy. Reproductive: The uterus and ovaries are grossly unremarkable. Other: Induration of the periumbilical fat related to recent port placement. No fluid collection. Musculoskeletal: Degenerative changes and disc desiccation at  L4-5. No acute osseous pathology. IMPRESSION: Small-bowel obstruction with transition in the lower abdomen/pelvis. Clinical correlation and follow-up recommended. Electronically Signed   By: Anner Crete M.D.   On: 10/09/2019 21:23   DG Abd 2 Views  Result Date: 10/10/2019 CLINICAL DATA:  74 year old female with history of small-bowel obstruction. EXAM: ABDOMEN - 2 VIEW COMPARISON:  Abdominal radiograph 10/09/2019. FINDINGS: Nasogastric tube with tip in the body of the stomach and side port distal to the gastroesophageal junction. Multiple air-fluid levels are noted on the upright projection. Mildly dilated loop of gas-filled small bowel in the left upper quadrant measuring 4 cm in diameter. Small amount of gas and stool noted throughout the colon including the distal rectum. No pneumoperitoneum. Surgical clips project over the right-side of the abdomen. IMPRESSION: 1. Abnormal bowel gas pattern favored to reflect partial small bowel obstruction, as above. 2. Support apparatus, as above. 3. No pneumoperitoneum. Electronically Signed   By: Vinnie Langton M.D.   On: 10/10/2019 08:59   DG ABD ACUTE 2+V W 1V CHEST  Result Date: 10/09/2019 CLINICAL DATA:  74 year old female with vomiting and bloating. EXAM: DG ABDOMEN ACUTE W/ 1V CHEST COMPARISON:  Abdominal radiograph dated 10/05/2019 and CT dated 10/05/2019 FINDINGS: The lungs are clear. There is no pleural effusion or pneumothorax. The cardiac silhouette is within normal limits. Mild atherosclerotic calcification of the aortic arch. Mildly dilated loop of small bowel in the left upper abdomen measures up to 4 cm in diameter and may represent postoperative ileus versus obstruction. Clinical correlation is recommended. No free air or radiopaque calculi identified. Appendectomy clips. The osseous structures and soft tissues are unremarkable. A 16 mm rounded calcific density in the left upper quadrant likely in the left breast. IMPRESSION: 1. No acute  cardiopulmonary process. 2. Mildly dilated loop of small bowel in the left upper abdomen may represent postoperative ileus versus obstruction. Electronically Signed   By: Anner Crete M.D.   On: 10/09/2019 20:02   DG Abd Portable 1V-Small Bowel Obstruction Protocol-initial, 8 hr delay  Result Date: 10/10/2019 CLINICAL DATA:  8 hour delay for small-bowel obstruction. EXAM: PORTABLE ABDOMEN - 1 VIEW COMPARISON:  October 10, 2019 FINDINGS: A nasogastric tube is seen with its distal tip overlying the body of the stomach. Multiple dilated, mildly opacified small bowel loops are seen throughout the abdomen and pelvis. No contrast is seen within the large bowel. Radiopaque surgical clips are seen within the mid to lower right abdomen. IMPRESSION: Findings consistent with small-bowel obstruction. Electronically Signed   By: Virgina Norfolk M.D.   On: 10/10/2019 19:53   DG Abd Portable 1V-Small Bowel Protocol-Position Verification  Result Date: 10/10/2019 CLINICAL DATA:  NG tube placement EXAM: PORTABLE ABDOMEN - 1 VIEW COMPARISON:  October 10, 2019 FINDINGS: The distal tip of the NG tube is in the right mid abdomen, likely in the distal stomach. IMPRESSION: Distal tip of the NG tube appears to be in the distal stomach. Electronically Signed   By: Dorise Bullion III M.D   On: 10/10/2019 13:54   DG Abd Portable 1 View  Result Date: 10/09/2019 CLINICAL DATA:  Nasogastric tube placement. EXAM: PORTABLE  ABDOMEN - 1 VIEW COMPARISON:  October 09, 2019 (7:50 p.m.) FINDINGS: Since the prior study there has been interval placement of a nasogastric tube. Its distal tip is seen overlying the expected region of the body of the stomach. Persistently dilated small bowel loops are seen overlying the mid left abdomen. These are unchanged in caliber when compared to the prior exam. No radio-opaque calculi or other significant radiographic abnormality are seen. Radiopaque surgical clips are again seen overlying the mid right  abdomen. IMPRESSION: 1. Nasogastric tube positioning, as described above. 2. Persistently dilated small bowel loops, which may reflect a small bowel obstruction versus ileus. Electronically Signed   By: Virgina Norfolk M.D.   On: 10/09/2019 23:46     Anti-infectives:   Anti-infectives (From admission, onward)   None      Alphonsa Overall, MD, [facility] Office: [phone] 10/11/2019

## 2019-10-11 NOTE — Progress Notes (Signed)
Patients husband came to front desk and stated that his wife seemed to be having some trouble catching her breath and sounded "junky", upon assessing patient who was sitting up at the bedside, coarse crackles were heard throughout lung fields, she was slightly tachypneic and shallow breathing, complained of a sore throat and a feeling of a lot of phlem coming up, encouraged to spit into kleenex as opposed to swallowing back down, rapid RN notified and arrived on floor with resp therapist within a few minutes, cxr was ordered, ng tube was advanced slightly and retaped as original tape was falling off, CCS to be paged to discuss events and place any additional warranted orders. IV fluids paused at this time until MD is notified.

## 2019-10-12 ENCOUNTER — Inpatient Hospital Stay (HOSPITAL_COMMUNITY): Payer: Medicare Other

## 2019-10-12 ENCOUNTER — Inpatient Hospital Stay: Payer: Self-pay

## 2019-10-12 LAB — GLUCOSE, CAPILLARY
Glucose-Capillary: 114 mg/dL — ABNORMAL HIGH (ref 70–99)
Glucose-Capillary: 121 mg/dL — ABNORMAL HIGH (ref 70–99)

## 2019-10-12 LAB — PHOSPHORUS: Phosphorus: 3.2 mg/dL (ref 2.5–4.6)

## 2019-10-12 LAB — CBC WITH DIFFERENTIAL/PLATELET
Abs Immature Granulocytes: 0.06 10*3/uL (ref 0.00–0.07)
Basophils Absolute: 0.1 10*3/uL (ref 0.0–0.1)
Basophils Relative: 0 %
Eosinophils Absolute: 0 10*3/uL (ref 0.0–0.5)
Eosinophils Relative: 0 %
HCT: 41.3 % (ref 36.0–46.0)
Hemoglobin: 13.4 g/dL (ref 12.0–15.0)
Immature Granulocytes: 0 %
Lymphocytes Relative: 14 %
Lymphs Abs: 2 10*3/uL (ref 0.7–4.0)
MCH: 29.6 pg (ref 26.0–34.0)
MCHC: 32.4 g/dL (ref 30.0–36.0)
MCV: 91.2 fL (ref 80.0–100.0)
Monocytes Absolute: 1.6 10*3/uL — ABNORMAL HIGH (ref 0.1–1.0)
Monocytes Relative: 12 %
Neutro Abs: 10.2 10*3/uL — ABNORMAL HIGH (ref 1.7–7.7)
Neutrophils Relative %: 74 %
Platelets: 362 10*3/uL (ref 150–400)
RBC: 4.53 MIL/uL (ref 3.87–5.11)
RDW: 12.4 % (ref 11.5–15.5)
WBC: 14 10*3/uL — ABNORMAL HIGH (ref 4.0–10.5)
nRBC: 0 % (ref 0.0–0.2)

## 2019-10-12 LAB — BASIC METABOLIC PANEL
Anion gap: 11 (ref 5–15)
BUN: 10 mg/dL (ref 8–23)
CO2: 31 mmol/L (ref 22–32)
Calcium: 9.2 mg/dL (ref 8.9–10.3)
Chloride: 92 mmol/L — ABNORMAL LOW (ref 98–111)
Creatinine, Ser: 0.7 mg/dL (ref 0.44–1.00)
GFR calc Af Amer: >60 mL/min (ref >60)
GFR calc non Af Amer: >60 mL/min (ref >60)
Glucose, Bld: 123 mg/dL — ABNORMAL HIGH (ref 70–99)
Potassium: 2.8 mmol/L — ABNORMAL LOW (ref 3.5–5.1)
Sodium: 134 mmol/L — ABNORMAL LOW (ref 135–145)

## 2019-10-12 LAB — MAGNESIUM: Magnesium: 2.2 mg/dL (ref 1.7–2.4)

## 2019-10-12 MED ORDER — TRAVASOL 10 % IV SOLN
INTRAVENOUS | Status: AC
  Filled 2019-10-12: qty 360

## 2019-10-12 MED ORDER — CHEWING GUM (ORBIT) SUGAR FREE
1.0000 | CHEWING_GUM | ORAL | Status: DC | PRN
  Administered 2019-10-12: 1 via ORAL
  Filled 2019-10-12 (×2): qty 1

## 2019-10-12 MED ORDER — POTASSIUM CHLORIDE 10 MEQ/100ML IV SOLN: 10.0000 meq | INTRAVENOUS | Status: DC

## 2019-10-12 MED ORDER — SODIUM CHLORIDE 0.9% FLUSH
10.0000 mL | INTRAVENOUS | Status: DC | PRN
  Administered 2019-10-16: 10 mL

## 2019-10-12 MED ORDER — METOPROLOL TARTRATE 5 MG/5ML IV SOLN
5.0000 mg | Freq: Four times a day (QID) | INTRAVENOUS | Status: DC | PRN
  Administered 2019-10-12 (×2): 5 mg via INTRAVENOUS
  Filled 2019-10-12 (×2): qty 5

## 2019-10-12 MED ORDER — CHLORHEXIDINE GLUCONATE CLOTH 2 % EX PADS
6.0000 | MEDICATED_PAD | Freq: Every day | CUTANEOUS | Status: DC
  Administered 2019-10-12 – 2019-10-19 (×7): 6 via TOPICAL

## 2019-10-12 MED ORDER — INSULIN ASPART 100 UNIT/ML ~~LOC~~ SOLN
0.0000 [IU] | Freq: Four times a day (QID) | SUBCUTANEOUS | Status: DC
  Administered 2019-10-13: 1 [IU] via SUBCUTANEOUS

## 2019-10-12 MED ORDER — KCL IN DEXTROSE-NACL 20-5-0.45 MEQ/L-%-% IV SOLN
INTRAVENOUS | Status: AC
  Filled 2019-10-12 (×3): qty 1000

## 2019-10-12 MED ORDER — POTASSIUM CHLORIDE 10 MEQ/100ML IV SOLN
10.0000 meq | INTRAVENOUS | Status: AC
  Administered 2019-10-12 (×6): 10 meq via INTRAVENOUS
  Filled 2019-10-12 (×6): qty 100

## 2019-10-12 NOTE — Progress Notes (Signed)
PHARMACY - TOTAL PARENTERAL NUTRITION CONSULT NOTE   Indication: Prolonged ileus  Patient Measurements: Height: 5\' 1"  (154.9 cm) Weight: 57.2 kg (126 lb 1.7 oz) IBW/kg (Calculated) : 47.8 TPN AdjBW (KG): 57.2 Body mass index is 23.83 kg/m. Usual Weight:   Assessment: Pt underwent lap appendectomy on 10/06/19, discharged from hospital on 10/08/19. Pt presented to ED on 4/23 with CC of emesis, constipation; concern for SBO vs ileus. Pharmacy consulted on 4/26 to initiate TPN for prolonged ileus.  Glucose / Insulin: No history of DM. Goal BG <150. CBG WNL prior to initiation of TPN. Electrolytes: Na (134) slightly low, K (2.8) low. All other lytes WNL. Renal: SCr 0.7, BUN 10 - both WNL. LFTs / TGs: LFT WNL (4/23), TG  Prealbumin / albumin: Prealbumin / Albumin (4.8) Intake / Output; MIVF: D5 1/2 NS 20 mEq KCl @ 125 mL/hr GI Imaging: 4/23 CT abdomen: SBO Surgeries / Procedures:  4/20: laparascopic appendectomy  Central access: Anticipated 4/26 TPN start date: 4/26 pending PICC line placement  Nutritional Goals: Pending RD recommendations  Current Nutrition:  NPO  TPN  Plan:  Now: -KCl 10 mEq IV x 6 runs  At 1800:  Start TPN at 30 mL/hr at 1800  Electrolytes in TPN: Standard   79mEq/L of Na, 77mEq/L of K, 80mEq/L of Ca, 53mEq/L of Mg, and 83mmol/L of Phos.   Cl:Ac ratio 1:1  Add standard MVI and trace elements to TPN  Initiate Sensitive q6h SSI and adjust as needed   Reduce MIVF to 90 mL/hr at 1800  Monitor TPN labs on Mon/Thurs, recheck electrolytes with AM labs tomorrow  Lenis Noon 10/12/2019,11:47 AM

## 2019-10-12 NOTE — Care Management Important Message (Signed)
Important Message  Patient Details IM Letter given to Lorre Nick SW Case Manager to present to the Patient Name: Kelly Harrington MRN: [id number] Date of Birth: [birth year]   Medicare Important Message Given:  Yes     Kerin Salen 10/12/2019, 12:31 PM

## 2019-10-12 NOTE — Plan of Care (Signed)
  Problem: Health Behavior/Discharge Planning: Goal: Ability to manage health-related needs will improve Outcome: Progressing   Problem: Clinical Measurements: Goal: Ability to maintain clinical measurements within normal limits will improve Outcome: Progressing Goal: Will remain free from infection Outcome: Progressing Goal: Diagnostic test results will improve Outcome: Progressing Goal: Respiratory complications will improve Outcome: Progressing Goal: Cardiovascular complication will be avoided Outcome: Progressing   Problem: Activity: Goal: Risk for activity intolerance will decrease Outcome: Progressing   Problem: Nutrition: Goal: Adequate nutrition will be maintained Outcome: Progressing   Problem: Health Behavior/Discharge Planning: Goal: Ability to manage health-related needs will improve Outcome: Progressing   Problem: Clinical Measurements: Goal: Ability to maintain clinical measurements within normal limits will improve Outcome: Progressing Goal: Will remain free from infection Outcome: Progressing Goal: Diagnostic test results will improve Outcome: Progressing Goal: Respiratory complications will improve Outcome: Progressing Goal: Cardiovascular complication will be avoided Outcome: Progressing   Problem: Activity: Goal: Risk for activity intolerance will decrease Outcome: Progressing   Problem: Nutrition: Goal: Adequate nutrition will be maintained Outcome: Progressing   Problem: Coping: Goal: Level of anxiety will decrease Outcome: Progressing   Problem: Elimination: Goal: Will not experience complications related to bowel motility Outcome: Progressing   Problem: Pain Managment: Goal: General experience of comfort will improve Outcome: Progressing   Problem: Safety: Goal: Ability to remain free from injury will improve Outcome: Progressing

## 2019-10-12 NOTE — Progress Notes (Addendum)
[facility] Progress Note     Subjective: CC: throat discomfort Denies abdominal pain. Denies flatus or BM.   Objective: Vital signs in last 24 hours: Temp:  [98.7 F (37.1 C)-99.3 F (37.4 C)] 99.3 F (37.4 C) (04/26 0543) Pulse Rate:  [94-105] 94 (04/26 0543) Resp:  [15-19] 15 (04/26 0543) BP: (123-163)/(78-85) 163/82 (04/26 0543) SpO2:  [96 %-97 %] 96 % (04/26 0543) Last BM Date: 10/05/19  Intake/Output from previous day: 04/25 0701 - 04/26 0700 In: 2497.3 [I.V.:2088.5; IV Piggyback:408.8] Out: 1525 [Emesis/NG output:1525] Intake/Output this shift: No intake/output data recorded.  PE: Gen:  Alert, NAD, pleasant Card:  Regular rate and rhythm, pedal pulses 2+ BL Pulm:  Normal effort, clear to auscultation bilaterally Abd: Soft, non-tender, mild distention, incisions C/D/I  NG with small amt dark, bilious, non-bloody output in cannister  1525 cc/24h Skin: warm and dry, no rashes  Psych: A&Ox3   Lab Results:  Recent Labs    10/10/19 0336 10/12/19 0304  WBC 8.1 14.0*  HGB 14.2 13.4  HCT 41.7 41.3  PLT 346 362   BMET Recent Labs    10/11/19 0322 10/12/19 0304  NA 134* 134*  K 2.7* 2.8*  CL 91* 92*  CO2 29 31  GLUCOSE 113* 123*  BUN 13 10  CREATININE 0.61 0.70  CALCIUM 9.5 9.2   PT/INR No results for input(s): LABPROT, INR in the last 72 hours. CMP     Component Value Date/Time   NA 134 (L) 10/12/2019 0304   K 2.8 (L) 10/12/2019 0304   CL 92 (L) 10/12/2019 0304   CO2 31 10/12/2019 0304   GLUCOSE 123 (H) 10/12/2019 0304   BUN 10 10/12/2019 0304   CREATININE 0.70 10/12/2019 0304   CALCIUM 9.2 10/12/2019 0304   PROT 8.7 (H) 10/09/2019 1931   ALBUMIN 4.8 10/09/2019 1931   AST 23 10/09/2019 1931   ALT 15 10/09/2019 1931   ALKPHOS 41 10/09/2019 1931   BILITOT 2.6 (H) 10/09/2019 1931   GFRNONAA >60 10/12/2019 0304   GFRAA >60 10/12/2019 0304   Lipase     Component Value Date/Time   LIPASE 29 10/09/2019 1931        Studies/Results: DG Chest Port 1 View  Result Date: 10/11/2019 CLINICAL DATA:  Respiratory distress. EXAM: PORTABLE CHEST 1 VIEW COMPARISON:  06/27/2009 chest radiograph FINDINGS: The cardiomediastinal silhouette is unremarkable. There is no evidence of focal airspace disease, pulmonary edema, suspicious pulmonary nodule/mass, pleural effusion, or pneumothorax. No acute bony abnormalities are identified. An NG tube is present within the stomach. IMPRESSION: No evidence of acute cardiopulmonary disease. NG tube within the stomach. Electronically Signed   By: Margarette Canada M.D.   On: 10/11/2019 20:36   DG Abd 2 Views  Result Date: 10/12/2019 CLINICAL DATA:  Abdominal distension and nausea EXAM: ABDOMEN - 2 VIEW COMPARISON:  October 11, 2019 FINDINGS: Nasogastric tube tip and side port in stomach. There remains bowel dilatation with multiple air-fluid levels. A small amount of air and contrast are noted in the colon. No free air. Lung bases are clear. No abnormal calcifications. IMPRESSION: Findings concerning for persistent degree of small bowel obstruction. No free air. Nasogastric tube tip and side port in stomach. Electronically Signed   By: Lowella Grip III M.D.   On: 10/12/2019 10:19   DG Abd 2 Views  Result Date: 10/11/2019 CLINICAL DATA:  Evaluate contrast transit, suspected bowel obstruction EXAM: ABDOMEN - 2 VIEW COMPARISON:  10/10/2019 FINDINGS: Interval transit of administered enteric  contrast to the proximal transverse colon with scattered gas present to the transverse colon as well. The small bowel remains diffusely fluid-filled although not overtly distended. Esophagogastric tube with tip and side port below the diaphragm. No free air. IMPRESSION: 1. Interval transit of administered enteric contrast to the proximal transverse colon with scattered gas present to the transverse colon as well. The small bowel remains diffusely fluid-filled although not overtly distended. 2.   Esophagogastric tube with tip and side port below the diaphragm. Electronically Signed   By: Eddie Candle M.D.   On: 10/11/2019 12:17   DG Abd Portable 1V-Small Bowel Obstruction Protocol-initial, 8 hr delay  Result Date: 10/10/2019 CLINICAL DATA:  8 hour delay for small-bowel obstruction. EXAM: PORTABLE ABDOMEN - 1 VIEW COMPARISON:  October 10, 2019 FINDINGS: A nasogastric tube is seen with its distal tip overlying the body of the stomach. Multiple dilated, mildly opacified small bowel loops are seen throughout the abdomen and pelvis. No contrast is seen within the large bowel. Radiopaque surgical clips are seen within the mid to lower right abdomen. IMPRESSION: Findings consistent with small-bowel obstruction. Electronically Signed   By: Virgina Norfolk M.D.   On: 10/10/2019 19:53    Anti-infectives: Anti-infectives (From admission, onward)   None     Assessment/Plan pSBO vs ileus  - s/p laparoscopic appendectomy 10/06/19 Dr. Marlou Starks - CT A/P 4/23 concerning for SBO w/ transition point in the lower abdomen/pelvis  - SB protocol with transition of contrast to the colon 5/15 - continues to have high NGT output (1500 cc/24h) - continue NG tube to LIWS and await return of bowel function - recommend PICC/TPN given one week of NPO status   FEN: NPO, IVF, start PICC/TPN, ok to have limited hard candy (not red) or chew gum for comfort. ID: none VTE: SCDs, SQH   LOS: 2 days    Obie Dredge, [facility]

## 2019-10-12 NOTE — Progress Notes (Signed)
Peripherally Inserted Central Catheter Placement  The IV Nurse has discussed with the patient and/or persons authorized to consent for the patient, the purpose of this procedure and the potential benefits and risks involved with this procedure.  The benefits include less needle sticks, lab draws from the catheter, and the patient may be discharged home with the catheter. Risks include, but not limited to, infection, bleeding, blood clot (thrombus formation), and puncture of an artery; nerve damage and irregular heartbeat and possibility to perform a PICC exchange if needed/ordered by physician.  Alternatives to this procedure were also discussed.  Bard Power PICC patient education guide, fact sheet on infection prevention and patient information card has been provided to patient /or left at bedside.    PICC Placement Documentation  PICC Double Lumen 10/12/19 PICC Right Brachial 35 cm 0 cm (Active)  Indication for Insertion or Continuance of Line Administration of hyperosmolar/irritating solutions (i.e. TPN, Vancomycin, etc.) 10/12/19 1530  Exposed Catheter (cm) 0 cm 10/12/19 1530  Site Assessment Clean;Dry;Intact 10/12/19 1530  Lumen #1 Status Flushed;Blood return noted 10/12/19 1530  Lumen #2 Status Flushed;Blood return noted 10/12/19 1530  Dressing Type Transparent 10/12/19 1530  Dressing Status Clean;Dry;Intact;Antimicrobial disc in place;Other (Comment) 10/12/19 1530  Dressing Intervention New dressing 10/12/19 1530  Dressing Change Due 10/19/19 10/12/19 1530       Synthia Innocent 10/12/2019, 3:31 PM

## 2019-10-13 ENCOUNTER — Inpatient Hospital Stay (HOSPITAL_COMMUNITY): Payer: Medicare Other

## 2019-10-13 DIAGNOSIS — E440 Moderate protein-calorie malnutrition: Secondary | ICD-10-CM | POA: Insufficient documentation

## 2019-10-13 LAB — CBC
HCT: 38.5 % (ref 36.0–46.0)
Hemoglobin: 12.1 g/dL (ref 12.0–15.0)
MCH: 29.5 pg (ref 26.0–34.0)
MCHC: 31.4 g/dL (ref 30.0–36.0)
MCV: 93.9 fL (ref 80.0–100.0)
Platelets: 346 10*3/uL (ref 150–400)
RBC: 4.1 MIL/uL (ref 3.87–5.11)
RDW: 12.5 % (ref 11.5–15.5)
WBC: 15.9 10*3/uL — ABNORMAL HIGH (ref 4.0–10.5)
nRBC: 0 % (ref 0.0–0.2)

## 2019-10-13 LAB — DIFFERENTIAL
Abs Immature Granulocytes: 0.1 10*3/uL — ABNORMAL HIGH (ref 0.00–0.07)
Basophils Absolute: 0.1 10*3/uL (ref 0.0–0.1)
Basophils Relative: 1 %
Eosinophils Absolute: 0.1 10*3/uL (ref 0.0–0.5)
Eosinophils Relative: 0 %
Immature Granulocytes: 1 %
Lymphocytes Relative: 14 %
Lymphs Abs: 2.3 10*3/uL (ref 0.7–4.0)
Monocytes Absolute: 1.7 10*3/uL — ABNORMAL HIGH (ref 0.1–1.0)
Monocytes Relative: 11 %
Neutro Abs: 11.7 10*3/uL — ABNORMAL HIGH (ref 1.7–7.7)
Neutrophils Relative %: 73 %

## 2019-10-13 LAB — COMPREHENSIVE METABOLIC PANEL
ALT: 12 U/L (ref 0–44)
AST: 19 U/L (ref 15–41)
Albumin: 3.6 g/dL (ref 3.5–5.0)
Alkaline Phosphatase: 50 U/L (ref 38–126)
Anion gap: 9 (ref 5–15)
BUN: 8 mg/dL (ref 8–23)
CO2: 26 mmol/L (ref 22–32)
Calcium: 9.1 mg/dL (ref 8.9–10.3)
Chloride: 100 mmol/L (ref 98–111)
Creatinine, Ser: 0.55 mg/dL (ref 0.44–1.00)
GFR calc Af Amer: >60 mL/min (ref >60)
GFR calc non Af Amer: >60 mL/min (ref >60)
Glucose, Bld: 110 mg/dL — ABNORMAL HIGH (ref 70–99)
Potassium: 3.9 mmol/L (ref 3.5–5.1)
Sodium: 135 mmol/L (ref 135–145)
Total Bilirubin: 2.2 mg/dL — ABNORMAL HIGH (ref 0.3–1.2)
Total Protein: 7.8 g/dL (ref 6.5–8.1)

## 2019-10-13 LAB — GLUCOSE, CAPILLARY
Glucose-Capillary: 114 mg/dL — ABNORMAL HIGH (ref 70–99)
Glucose-Capillary: 114 mg/dL — ABNORMAL HIGH (ref 70–99)
Glucose-Capillary: 128 mg/dL — ABNORMAL HIGH (ref 70–99)

## 2019-10-13 LAB — TRIGLYCERIDES: Triglycerides: 93 mg/dL (ref <150)

## 2019-10-13 LAB — MAGNESIUM: Magnesium: 2.3 mg/dL (ref 1.7–2.4)

## 2019-10-13 LAB — PHOSPHORUS: Phosphorus: 2.5 mg/dL (ref 2.5–4.6)

## 2019-10-13 LAB — PREALBUMIN: Prealbumin: 16.4 mg/dL — ABNORMAL LOW (ref 18–38)

## 2019-10-13 MED ORDER — DEXTROSE-NACL 5-0.45 % IV SOLN
INTRAVENOUS | Status: AC
  Filled 2019-10-13: qty 1000

## 2019-10-13 MED ORDER — TRAVASOL 10 % IV SOLN
INTRAVENOUS | Status: AC
  Filled 2019-10-13: qty 636

## 2019-10-13 MED ORDER — SODIUM PHOSPHATES 45 MMOLE/15ML IV SOLN
10.0000 mmol | Freq: Once | INTRAVENOUS | Status: AC
  Administered 2019-10-13: 10 mmol via INTRAVENOUS
  Filled 2019-10-13: qty 3.33

## 2019-10-13 NOTE — Progress Notes (Signed)
NGT advanced to mark with pink tape, and re-secured.    [place], Therapist, sports

## 2019-10-13 NOTE — Progress Notes (Signed)
PHARMACY - TOTAL PARENTERAL NUTRITION CONSULT NOTE   Indication: Prolonged ileus  Patient Measurements: Height: 5\' 1"  (154.9 cm) Weight: 54.6 kg (120 lb 6.4 oz) IBW/kg (Calculated) : 47.8 TPN AdjBW (KG): 57.2 Body mass index is 22.75 kg/m. Usual Weight:   Assessment: Pt underwent lap appendectomy on 10/06/19, discharged from hospital on 10/08/19. Pt presented to ED on 4/23 with CC of emesis, constipation; concern for SBO vs ileus. Pharmacy consulted on 4/26 to initiate TPN for prolonged ileus.  Glucose / Insulin: No history of DM. Goal BG <150. CBG range 110-114 is WNL. 1 unit SSI/24 hrs. Electrolytes: All lytes WNL, Phos (2.5) on lower end of normal.  Renal: SCr & BUN - both WNL LFTs / TGs: LFT WNL (4/27), TG 93 (4/27) Prealbumin / albumin: Prealbumin 16.4 / Albumin 3.6 Intake / Output; MIVF: Unmeasured UOP x5; NG output 150 mL. MIVF = D5 1/2 NS 20 mEq KCl @ 90 mL/hr GI Imaging: 4/23 CT abdomen: SBO 4/26 Abdomen: Findings concerning for persistent degree of SBO. No free air.  Surgeries / Procedures:  4/20: laparascopic appendectomy  Central access: Double lumen PICC placed 4/26 TPN start date: 4/26  Nutritional Goals: Per RD recommendations (4/27): Kcal: 1695 - 1860 Protein: 85-100 g Fluid: >/= 1.8 L/day  Custom TPN at goal rate of 75 mL/hr will provide 95 g protein, 288 g dextrose, 1775 kcal; meeting 100% of patient needs  Current Nutrition:  NPO except for ice chips TPN  Plan:  Now: -Sodium phosphate 10 mmol IV once  At 1800:  Increase TPN to 50 mL/hr at 1800  Electrolytes in TPN: Standard. No changes.  48mEq/L of Na, 68mEq/L of K, 28mEq/L of Ca, 54mEq/L of Mg, and 30mmol/L of Phos.   Cl:Ac ratio 1:1  Add standard MVI and trace elements to TPN  Continue Sensitive q6h SSI and adjust as needed   Reduce MIVF to 70 mL/hr at 1800. Remove KCl from MIVF  Monitor TPN labs on Mon/Thurs, recheck electrolytes with AM labs tomorrow.  Lenis Noon,  PharmD 10/13/2019,7:06 AM

## 2019-10-13 NOTE — Progress Notes (Signed)
Pt NGT came out, new NGT was put in. Pt tolerated well. DG abd Portable was ordered to confirm placement.

## 2019-10-13 NOTE — Progress Notes (Signed)
Assessment & Plan: Partial SBO vs ileus  - s/p laparoscopic appendectomy 10/06/19 Dr. Marlou Starks - CT A/P 4/23 concerning for SBO w/ transition point in the lower abdomen/pelvis  - SB protocol with transition of contrast to the colon 5/15 - continue NG tube to LIWS and await return of bowel function - PICC/TPN - encouraged ambulation in halls  NG tube replaced again this AM after patient dislodged while up to bathroom this AM.  Discussed ileus and its management with patient.  Monitor WBC - check again in AM 4/28.  May need repeat CT scan to rule out abscess next 1-2 days.        Armandina Gemma, MD       [facility]       Office: [phone]   Chief Complaint: Acute appendicitis, post op ileus  Subjective: Patient in bed, pleasant.  Denies pain.  No flatus or BM's.  Objective: Vital signs in last 24 hours: Temp:  [98.5 F (36.9 C)-100.4 F (38 C)] 98.8 F (37.1 C) (04/27 0626) Pulse Rate:  [87-109] 99 (04/27 0626) Resp:  [17-20] 17 (04/27 0626) BP: (147-165)/(80-101) 149/96 (04/27 0626) SpO2:  [98 %-100 %] 99 % (04/27 0626) Weight:  [54.6 kg] 54.6 kg (04/26 1143) Last BM Date: 10/05/19  Intake/Output from previous day: 04/26 0701 - 04/27 0700 In: 3398.6 [I.V.:2937.8; IV Piggyback:460.8] Out: 150 [Emesis/NG output:150] Intake/Output this shift: No intake/output data recorded.  Physical Exam: HEENT - sclerae clear, mucous membranes moist Neck - soft Chest - clear bilaterally Cor - RRR Abdomen - soft, non-tender; wounds dry and intact with Dermabond; quiet - rare bowel sounds Ext - no edema, non-tender Neuro - alert & oriented, no focal deficits  Lab Results:  Recent Labs    10/12/19 0304 10/13/19 0340  WBC 14.0* 15.9*  HGB 13.4 12.1  HCT 41.3 38.5  PLT 362 346   BMET Recent Labs    10/12/19 0304 10/13/19 0340  NA 134* 135  K 2.8* 3.9  CL 92* 100  CO2 31 26  GLUCOSE 123* 110*  BUN 10 8  CREATININE 0.70 0.55  CALCIUM 9.2 9.1    PT/INR No results for input(s): LABPROT, INR in the last 72 hours. Comprehensive Metabolic Panel:    Component Value Date/Time   NA 135 10/13/2019 0340   NA 134 (L) 10/12/2019 0304   K 3.9 10/13/2019 0340   K 2.8 (L) 10/12/2019 0304   CL 100 10/13/2019 0340   CL 92 (L) 10/12/2019 0304   CO2 26 10/13/2019 0340   CO2 31 10/12/2019 0304   BUN 8 10/13/2019 0340   BUN 10 10/12/2019 0304   CREATININE 0.55 10/13/2019 0340   CREATININE 0.70 10/12/2019 0304   GLUCOSE 110 (H) 10/13/2019 0340   GLUCOSE 123 (H) 10/12/2019 0304   CALCIUM 9.1 10/13/2019 0340   CALCIUM 9.2 10/12/2019 0304   AST 19 10/13/2019 0340   AST 23 10/09/2019 1931   ALT 12 10/13/2019 0340   ALT 15 10/09/2019 1931   ALKPHOS 50 10/13/2019 0340   ALKPHOS 41 10/09/2019 1931   BILITOT 2.2 (H) 10/13/2019 0340   BILITOT 2.6 (H) 10/09/2019 1931   PROT 7.8 10/13/2019 0340   PROT 8.7 (H) 10/09/2019 1931   ALBUMIN 3.6 10/13/2019 0340   ALBUMIN 4.8 10/09/2019 1931    Studies/Results: DG Chest Port 1 View  Result Date: 10/11/2019 CLINICAL DATA:  Respiratory distress. EXAM: PORTABLE CHEST 1 VIEW COMPARISON:  06/27/2009 chest radiograph FINDINGS: The cardiomediastinal  silhouette is unremarkable. There is no evidence of focal airspace disease, pulmonary edema, suspicious pulmonary nodule/mass, pleural effusion, or pneumothorax. No acute bony abnormalities are identified. An NG tube is present within the stomach. IMPRESSION: No evidence of acute cardiopulmonary disease. NG tube within the stomach. Electronically Signed   By: Margarette Canada M.D.   On: 10/11/2019 20:36   DG Abd 2 Views  Result Date: 10/12/2019 CLINICAL DATA:  Abdominal distension and nausea EXAM: ABDOMEN - 2 VIEW COMPARISON:  October 11, 2019 FINDINGS: Nasogastric tube tip and side port in stomach. There remains bowel dilatation with multiple air-fluid levels. A small amount of air and contrast are noted in the colon. No free air. Lung bases are clear. No abnormal  calcifications. IMPRESSION: Findings concerning for persistent degree of small bowel obstruction. No free air. Nasogastric tube tip and side port in stomach. Electronically Signed   By: Lowella Grip III M.D.   On: 10/12/2019 10:19   DG Abd Portable 1V  Result Date: 10/13/2019 CLINICAL DATA:  Nasogastric tube placement EXAM: PORTABLE ABDOMEN - 1 VIEW COMPARISON:  Yesterday FINDINGS: The nasogastric tube loops through the stomach with tip at the fundus. Dilated small bowel in the central abdomen measuring up to 4 cm in diameter. Oral contrast is again seen in the ascending colon. IMPRESSION: 1. Lengthened orogastric tube which loops through the stomach with tip at the gastric fundus. 2. Continued small bowel dilatation. Electronically Signed   By: Monte Fantasia M.D.   On: 10/13/2019 08:23   DG Abd Portable 1V  Result Date: 10/12/2019 CLINICAL DATA:  Nasogastric placement EXAM: PORTABLE ABDOMEN - 1 VIEW COMPARISON:  10/12/2019 FINDINGS: Orogastric or nasogastric tube enters the stomach, loops in the fundus and has its tip at the body antral junction. IMPRESSION: Orogastric or nasogastric tube tip at the body antral junction of the stomach. Electronically Signed   By: Nelson Chimes M.D.   On: 10/12/2019 16:13   [place] EKG SITE RITE  Result Date: 10/12/2019 If Site Rite image not attached, placement could not be confirmed due to current cardiac rhythm.     Armandina Gemma 10/13/2019  Patient ID: Kelly Harrington, female   DOB: 05-20-1946, 74 y.o.   MRN: [id number]

## 2019-10-13 NOTE — Progress Notes (Signed)
Initial Nutrition Assessment  DOCUMENTATION CODES:   Non-severe (moderate) malnutrition in context of acute illness/injury  INTERVENTION:  - TPN advancement and management by Pharmacist. - will monitor for ability to advance diet.  Monitor magnesium, potassium, and phosphorus daily for at least 3 days, MD to replete as needed, as pt is at risk for refeeding syndrome given moderate malnutrition, very limited PO intakes x1 week.   NUTRITION DIAGNOSIS:   Moderate Malnutrition related to acute illness(s/p lap appy) as evidenced by energy intake < 75% for > 7 days, percent weight loss.  GOAL:   Patient will meet greater than or equal to 90% of their needs   MONITOR:   Diet advancement, Labs, Weight trends, Other (Comment)(TPN regimen)  REASON FOR ASSESSMENT:   Consult New TPN/TNA  ASSESSMENT:   74 y.o. female who underwent a lap appendectomy on 10/06/19 and was discharged on 10/08/19. She continued to feel nauseated after discharge and presented to the ED on 4/24 with complaints of ongoing nausea, vomiting, and feeling more bloated. CT showed SBO.  Significant Events: 4/20- lap appy 4/22- discharge 4/23- presented to the ED; NGT placed in R nare 4/24- re-admission 4/26- double lumen PICC placed in R brachial; TPN initiation 4/27- NGT replaced after being accidentally dislodged   Patient has been NPO since admission. She is laying in bed with no family/visitors present. Patient states that her stomach/abdomen feels "ok" and she does not feel the need to vomit. New NGT now in place and was clamped at the time of RD visit; patient was having copious amounts of think mucus in oral cavity that she was removing with a tissue.   Patient states that since surgery on 4/20 she has had very little PO intake. She has been NPO since re-admission. Per chart review, weight on 4/20 was 126 lb and weight yesterday was 120 lb. This indicates 6 lb weight loss (4.8% body weight) in the past 1 week.  Could also be in part d/t dehydration with poor oral intake.  Patient is currently receiving custom TPN at 30 ml/hr. Advancement to be per Pharmacist.  Per notes: - GI note 4/26 states NGT output of 1500 ml in 24 hours   Labs reviewed; CBG: 114 mmol/l. Medications reviewed;  IVF; D5-1/2 NS-20 mEq KCl @ 90 ml/hr (367 kcal).    NUTRITION - FOCUSED PHYSICAL EXAM:  completed to upper body only; no muscle or fat wasting noted at this time.  Diet Order:   Diet Order            Diet NPO time specified Except for: Ice Chips  Diet effective now              EDUCATION NEEDS:   Not appropriate for education at this time  Skin:  Skin Assessment: Reviewed RN Assessment  Last BM:  PTA/unknown  Height:   Ht Readings from Last 1 Encounters:  10/09/19 5\' 1"  (1.549 m)    Weight:   Wt Readings from Last 1 Encounters:  10/12/19 54.6 kg    Ideal Body Weight:  47.7 kg  BMI:  Body mass index is 22.75 kg/m.  Estimated Nutritional Needs:   Kcal:  1695-1860 kcal  Protein:  85-100 grams  Fluid:  >/= 1.8 L/day     Jarome Matin, MS, RD, LDN, CNSC Inpatient Clinical Dietitian RD pager # available in AMION  After hours/weekend pager # available in [facility]

## 2019-10-14 LAB — CBC
HCT: 37.3 % (ref 36.0–46.0)
Hemoglobin: 11.8 g/dL — ABNORMAL LOW (ref 12.0–15.0)
MCH: 29.6 pg (ref 26.0–34.0)
MCHC: 31.6 g/dL (ref 30.0–36.0)
MCV: 93.7 fL (ref 80.0–100.0)
Platelets: 362 10*3/uL (ref 150–400)
RBC: 3.98 MIL/uL (ref 3.87–5.11)
RDW: 12.3 % (ref 11.5–15.5)
WBC: 12.9 10*3/uL — ABNORMAL HIGH (ref 4.0–10.5)
nRBC: 0 % (ref 0.0–0.2)

## 2019-10-14 LAB — BASIC METABOLIC PANEL
Anion gap: 9 (ref 5–15)
BUN: 10 mg/dL (ref 8–23)
CO2: 25 mmol/L (ref 22–32)
Calcium: 9.3 mg/dL (ref 8.9–10.3)
Chloride: 102 mmol/L (ref 98–111)
Creatinine, Ser: 0.63 mg/dL (ref 0.44–1.00)
GFR calc Af Amer: >60 mL/min (ref >60)
GFR calc non Af Amer: >60 mL/min (ref >60)
Glucose, Bld: 114 mg/dL — ABNORMAL HIGH (ref 70–99)
Potassium: 3.7 mmol/L (ref 3.5–5.1)
Sodium: 136 mmol/L (ref 135–145)

## 2019-10-14 LAB — PHOSPHORUS: Phosphorus: 2.5 mg/dL (ref 2.5–4.6)

## 2019-10-14 LAB — GLUCOSE, CAPILLARY
Glucose-Capillary: 111 mg/dL — ABNORMAL HIGH (ref 70–99)
Glucose-Capillary: 113 mg/dL — ABNORMAL HIGH (ref 70–99)
Glucose-Capillary: 115 mg/dL — ABNORMAL HIGH (ref 70–99)
Glucose-Capillary: 118 mg/dL — ABNORMAL HIGH (ref 70–99)
Glucose-Capillary: 118 mg/dL — ABNORMAL HIGH (ref 70–99)

## 2019-10-14 LAB — MAGNESIUM: Magnesium: 2.2 mg/dL (ref 1.7–2.4)

## 2019-10-14 MED ORDER — POTASSIUM PHOSPHATES 15 MMOLE/5ML IV SOLN
10.0000 mmol | Freq: Once | INTRAVENOUS | Status: AC
  Administered 2019-10-14: 10 mmol via INTRAVENOUS
  Filled 2019-10-14: qty 3.33

## 2019-10-14 MED ORDER — TRAVASOL 10 % IV SOLN
INTRAVENOUS | Status: AC
  Filled 2019-10-14: qty 954

## 2019-10-14 MED ORDER — POTASSIUM CHLORIDE 10 MEQ/50ML IV SOLN
10.0000 meq | INTRAVENOUS | Status: AC
  Administered 2019-10-14 (×2): 10 meq via INTRAVENOUS
  Filled 2019-10-14 (×2): qty 50

## 2019-10-14 MED ORDER — DEXTROSE-NACL 5-0.45 % IV SOLN: INTRAVENOUS | Status: DC

## 2019-10-14 NOTE — Progress Notes (Signed)
PHARMACY - TOTAL PARENTERAL NUTRITION CONSULT NOTE   Indication: Prolonged ileus  Patient Measurements: Height: 5\' 1"  (154.9 cm) Weight: 54.6 kg (120 lb 6.4 oz) IBW/kg (Calculated) : 47.8 TPN AdjBW (KG): 57.2 Body mass index is 22.75 kg/m. Usual Weight:   Assessment: Pt underwent lap appendectomy on 10/06/19, discharged from hospital on 10/08/19. Pt presented to ED on 4/23 with CC of emesis, constipation; concern for SBO vs ileus. Pharmacy consulted on 4/26 to initiate TPN for prolonged ileus.  Glucose / Insulin: No history of DM. Goal BG <150. CBG ranging 114-128 are WNL. No SSI/24 hrs. Electrolytes: All lytes WNL, Phos (2.5) & K (3.7) on lower end of normal.  Renal: SCr & BUN - both WNL LFTs / TGs: LFT WNL (4/27), TG 93 (4/27) Prealbumin / albumin: Prealbumin 16.4 / Albumin 3.6 Intake / Output; MIVF: Unmeasured UOP x4; NG output 200 mL. MIVF = D5 1/2 NS @ 70 mL/hr GI Imaging: 4/23 CT abdomen: SBO 4/26 Abdomen: Findings concerning for persistent degree of SBO. No free air.  Surgeries / Procedures:  4/20: laparascopic appendectomy 4/27: NGT dislodged and replaced  Central access: Double lumen PICC placed 4/26 TPN start date: 4/26  Nutritional Goals: Per RD recommendations (4/27): Kcal: 1695 - 1860 Protein: 85-100 g Fluid: >/= 1.8 L/day  Custom TPN at goal rate of 75 mL/hr will provide 95 g protein, 288 g dextrose, 1775 kcal; meeting 100% of patient needs  Current Nutrition:  NPO except for ice chips TPN  Plan:  Now: -Potassium chloride 10 mEq IV x2  -Potassium phosphate 10 mmol IV once  At 1800:  Increase TPN to goal rate of 75 mL/hr  Electrolytes in TPN: Increase K, Phos.  57mEq/L of Na, 96mEq/L of K, 23mEq/L of Ca, 14mEq/L of Mg, and 60mmol/L of Phos.   Cl:Ac ratio 1:1  Add standard MVI and trace elements to TPN  Continue Sensitive q6h SSI and adjust as needed   Reduce MIVF to 40 mL/hr at 1800. Further adjustment per MD  Monitor TPN labs on Mon/Thurs,  recheck electrolytes with AM labs tomorrow.  Lenis Noon, PharmD 10/14/2019,7:17 AM

## 2019-10-14 NOTE — Progress Notes (Signed)
[facility] Progress Note     Subjective: CC: Denies pain, has some mild throat discomfort. Small amt flatus, no BM. NG- 200cc documented, came out yesterday and had to bed replaced   WBC 12.9 from 15, afebrile VSS Objective: Vital signs in last 24 hours: Temp:  [98 F (36.7 C)-99.6 F (37.6 C)] 98 F (36.7 C) (04/28 0459) Pulse Rate:  [95-98] 95 (04/28 0459) Resp:  [14] 14 (04/28 0459) BP: (136-138)/(88-90) 138/88 (04/28 0459) SpO2:  [97 %] 97 % (04/28 0459) Last BM Date: 10/05/19  Intake/Output from previous day: 04/27 0701 - 04/28 0700 In: 3455.6 [I.V.:3202.6; IV Piggyback:253] Out: 200 [Emesis/NG output:200] Intake/Output this shift: No intake/output data recorded.  PE: Gen:  Alert, NAD, pleasant Card:  Regular rate and rhythm, pedal pulses 2+ BL Pulm:  Normal effort, clear to auscultation bilaterally Abd: Soft, non-tender, non-distended, incisions C/D/I  NG 200 cc bilious, dark Skin: warm and dry, no rashes  Psych: A&Ox3   Lab Results:  Recent Labs    10/13/19 0340 10/14/19 0332  WBC 15.9* 12.9*  HGB 12.1 11.8*  HCT 38.5 37.3  PLT 346 362   BMET Recent Labs    10/13/19 0340 10/14/19 0332  NA 135 136  K 3.9 3.7  CL 100 102  CO2 26 25  GLUCOSE 110* 114*  BUN 8 10  CREATININE 0.55 0.63  CALCIUM 9.1 9.3   PT/INR No results for input(s): LABPROT, INR in the last 72 hours. CMP     Component Value Date/Time   NA 136 10/14/2019 0332   K 3.7 10/14/2019 0332   CL 102 10/14/2019 0332   CO2 25 10/14/2019 0332   GLUCOSE 114 (H) 10/14/2019 0332   BUN 10 10/14/2019 0332   CREATININE 0.63 10/14/2019 0332   CALCIUM 9.3 10/14/2019 0332   PROT 7.8 10/13/2019 0340   ALBUMIN 3.6 10/13/2019 0340   AST 19 10/13/2019 0340   ALT 12 10/13/2019 0340   ALKPHOS 50 10/13/2019 0340   BILITOT 2.2 (H) 10/13/2019 0340   GFRNONAA >60 10/14/2019 0332   GFRAA >60 10/14/2019 0332   Lipase     Component Value Date/Time   LIPASE 29 10/09/2019 1931        Studies/Results: DG Abd 2 Views  Result Date: 10/12/2019 CLINICAL DATA:  Abdominal distension and nausea EXAM: ABDOMEN - 2 VIEW COMPARISON:  October 11, 2019 FINDINGS: Nasogastric tube tip and side port in stomach. There remains bowel dilatation with multiple air-fluid levels. A small amount of air and contrast are noted in the colon. No free air. Lung bases are clear. No abnormal calcifications. IMPRESSION: Findings concerning for persistent degree of small bowel obstruction. No free air. Nasogastric tube tip and side port in stomach. Electronically Signed   By: Lowella Grip III M.D.   On: 10/12/2019 10:19   DG Abd Portable 1V  Result Date: 10/13/2019 CLINICAL DATA:  Nasogastric tube placement EXAM: PORTABLE ABDOMEN - 1 VIEW COMPARISON:  Yesterday FINDINGS: The nasogastric tube loops through the stomach with tip at the fundus. Dilated small bowel in the central abdomen measuring up to 4 cm in diameter. Oral contrast is again seen in the ascending colon. IMPRESSION: 1. Lengthened orogastric tube which loops through the stomach with tip at the gastric fundus. 2. Continued small bowel dilatation. Electronically Signed   By: Monte Fantasia M.D.   On: 10/13/2019 08:23   DG Abd Portable 1V  Result Date: 10/12/2019 CLINICAL DATA:  Nasogastric placement EXAM: PORTABLE ABDOMEN - 1  VIEW COMPARISON:  10/12/2019 FINDINGS: Orogastric or nasogastric tube enters the stomach, loops in the fundus and has its tip at the body antral junction. IMPRESSION: Orogastric or nasogastric tube tip at the body antral junction of the stomach. Electronically Signed   By: Nelson Chimes M.D.   On: 10/12/2019 16:13   [place] EKG SITE RITE  Result Date: 10/12/2019 If Site Rite image not attached, placement could not be confirmed due to current cardiac rhythm.   Anti-infectives: Anti-infectives (From admission, onward)   None     Assessment/Plan pSBO vs ileus  - s/p laparoscopic appendectomy 10/06/19 Dr. Marlou Starks - CT  A/P 4/23 concerning for SBO w/ transition point in the lower abdomen/pelvis  - SB protocol with transition of contrast to the colon 5/15 - continue NG tube to LIWS and await return of bowel function - PICC/TPN - encouraged ambulation in halls - consider repeat CT abdomen for worsening leukocytosis or persistent prolonged ileus   FEN: NPO, IVF, PICC/TPN, ok to have limited hard candy (not red) or chew gum for comfort. ID: none VTE: SCDs, SQH   LOS: 4 days    Obie Dredge, [facility]

## 2019-10-14 NOTE — Plan of Care (Signed)
Plan of care reviewed and discussed with the patient.  TNA providing adequate nutrition.

## 2019-10-14 NOTE — Progress Notes (Signed)
NGT advanced to mark with pink tape, and re-secured. Pt tolerated well, O2 99% RA P 96.  Flushed NG tube with173ml free air at both port sites and reattached to LIWS.

## 2019-10-15 LAB — COMPREHENSIVE METABOLIC PANEL
ALT: 61 U/L — ABNORMAL HIGH (ref 0–44)
AST: 104 U/L — ABNORMAL HIGH (ref 15–41)
Albumin: 3.3 g/dL — ABNORMAL LOW (ref 3.5–5.0)
Alkaline Phosphatase: 129 U/L — ABNORMAL HIGH (ref 38–126)
Anion gap: 8 (ref 5–15)
BUN: 13 mg/dL (ref 8–23)
CO2: 23 mmol/L (ref 22–32)
Calcium: 9.1 mg/dL (ref 8.9–10.3)
Chloride: 104 mmol/L (ref 98–111)
Creatinine, Ser: 0.57 mg/dL (ref 0.44–1.00)
GFR calc Af Amer: >60 mL/min (ref >60)
GFR calc non Af Amer: >60 mL/min (ref >60)
Glucose, Bld: 123 mg/dL — ABNORMAL HIGH (ref 70–99)
Potassium: 3.8 mmol/L (ref 3.5–5.1)
Sodium: 135 mmol/L (ref 135–145)
Total Bilirubin: 2.1 mg/dL — ABNORMAL HIGH (ref 0.3–1.2)
Total Protein: 7.6 g/dL (ref 6.5–8.1)

## 2019-10-15 LAB — CBC
HCT: 36.1 % (ref 36.0–46.0)
Hemoglobin: 11.7 g/dL — ABNORMAL LOW (ref 12.0–15.0)
MCH: 30.5 pg (ref 26.0–34.0)
MCHC: 32.4 g/dL (ref 30.0–36.0)
MCV: 94 fL (ref 80.0–100.0)
Platelets: 374 10*3/uL (ref 150–400)
RBC: 3.84 MIL/uL — ABNORMAL LOW (ref 3.87–5.11)
RDW: 12.4 % (ref 11.5–15.5)
WBC: 10.1 10*3/uL (ref 4.0–10.5)
nRBC: 0 % (ref 0.0–0.2)

## 2019-10-15 LAB — MAGNESIUM: Magnesium: 2.1 mg/dL (ref 1.7–2.4)

## 2019-10-15 LAB — PHOSPHORUS: Phosphorus: 3.6 mg/dL (ref 2.5–4.6)

## 2019-10-15 LAB — GLUCOSE, CAPILLARY
Glucose-Capillary: 116 mg/dL — ABNORMAL HIGH (ref 70–99)
Glucose-Capillary: 115 mg/dL — ABNORMAL HIGH (ref 70–99)
Glucose-Capillary: 120 mg/dL — ABNORMAL HIGH (ref 70–99)

## 2019-10-15 MED ORDER — TRAVASOL 10 % IV SOLN
INTRAVENOUS | Status: AC
  Filled 2019-10-15: qty 954

## 2019-10-15 MED ORDER — GLYCERIN (LAXATIVE) 2.1 G RE SUPP
1.0000 | Freq: Once | RECTAL | Status: AC
  Administered 2019-10-15: 1 via RECTAL
  Filled 2019-10-15 (×2): qty 1

## 2019-10-15 NOTE — Care Management Important Message (Signed)
Important Message  Patient Details IM Letter given to [facility] Case Manager to present to the Patient Name: Kelly Harrington MRN: [id number] Date of Birth: Dec 12, 1945   Medicare Important Message Given:  Yes     Kerin Salen 10/15/2019, 12:22 PM

## 2019-10-15 NOTE — Progress Notes (Signed)
PHARMACY - TOTAL PARENTERAL NUTRITION CONSULT NOTE   Indication: Prolonged ileus  Patient Measurements: Height: 5\' 1"  (154.9 cm) Weight: 54.6 kg (120 lb 6.4 oz) IBW/kg (Calculated) : 47.8 TPN AdjBW (KG): 57.2 Body mass index is 22.75 kg/m. Usual Weight:   Assessment: Pt underwent lap appendectomy on 10/06/19, discharged from hospital on 10/08/19. Pt presented to ED on 4/23 with CC of emesis, constipation; concern for SBO vs ileus. Pharmacy consulted on 4/26 to initiate TPN for prolonged ileus.  Glucose / Insulin: No history of DM. Goal BG <150. CBG ranging 111-123 are WNL. No SSI required in past 24 hours.  Electrolytes: All lytes WNL.  Renal: SCr & BUN - both WNL & stable LFTs / TGs: LFT elevated 104/61 (4/29), TG 93 WNL (4/27) Prealbumin / albumin: Prealbumin 16.4 / Albumin 3.3 Intake / Output; MIVF: Unmeasured UOP x4; NG output 200 mL. No BM. MIVF = D5 1/2 NS @ 40 mL/hr GI Imaging: 4/23 CT abdomen: SBO 4/26 Abdomen: Findings concerning for persistent degree of SBO. No free air.  Surgeries / Procedures:  4/20: laparascopic appendectomy 4/27: NGT dislodged and replaced  Central access: Double lumen PICC placed 4/26 TPN start date: 4/26  Nutritional Goals: Per RD recommendations (4/27): Kcal: 1695 - 1860 Protein: 85-100 g Fluid: >/= 1.8 L/day  Custom TPN at goal rate of 75 mL/hr will provide 95 g protein, 288 g dextrose, 1775 kcal; meeting 100% of patient needs  Current Nutrition:  NPO except for ice chips TPN  Plan:   At 1800:  Continue TPN at goal rate of 75 mL/hr  Electrolytes in TPN: No changes  25mEq/L of Na, 63mEq/L of K, 19mEq/L of Ca, 38mEq/L of Mg, and 37mmol/L of Phos.   Cl:Ac ratio 1:1  Continue standard MVI and trace elements in TPN  Continue Sensitive q6h SSI and adjust as needed   MIVF at 40 mL/hr. Further adjustment per MD  Monitor TPN labs on Mon/Thurs, recheck electrolytes with AM labs tomorrow.  Lenis Noon, PharmD 10/15/2019,7:06 AM

## 2019-10-15 NOTE — Progress Notes (Signed)
Patient ID: Kelly Harrington, female   DOB: 11/11/1945, 74 y.o.   MRN: [id number]       Subjective: Passing some flatus.  No significant pain.  No BM.  Had hallucinations overnight which really frightened her.  Walking the halls multiple times a day  ROS: See above, otherwise other systems negative  Objective: Vital signs in last 24 hours: Temp:  [98.9 F (37.2 C)-99.9 F (37.7 C)] 98.9 F (37.2 C) (04/29 0629) Pulse Rate:  [95-105] 95 (04/29 0629) Resp:  [15-17] 15 (04/29 0629) BP: (140-145)/(91-94) 145/91 (04/29 0629) SpO2:  [99 %-100 %] 99 % (04/29 0629) Last BM Date: 10/05/19  Intake/Output from previous day: 04/28 0701 - 04/29 0700 In: 2564.3 [P.O.:120; I.V.:2091.3; IV Piggyback:353] Out: 200 [Emesis/NG output:200] Intake/Output this shift: No intake/output data recorded.  PE: Heart: regular Lungs: CTAB Abd: soft, +BS, NGT with minimal output, ND, incisions are c/d/i  Lab Results:  Recent Labs    10/14/19 0332 10/15/19 0825  WBC 12.9* 10.1  HGB 11.8* 11.7*  HCT 37.3 36.1  PLT 362 374   BMET Recent Labs    10/14/19 0332 10/15/19 0416  NA 136 135  K 3.7 3.8  CL 102 104  CO2 25 23  GLUCOSE 114* 123*  BUN 10 13  CREATININE 0.63 0.57  CALCIUM 9.3 9.1   PT/INR No results for input(s): LABPROT, INR in the last 72 hours. CMP     Component Value Date/Time   NA 135 10/15/2019 0416   K 3.8 10/15/2019 0416   CL 104 10/15/2019 0416   CO2 23 10/15/2019 0416   GLUCOSE 123 (H) 10/15/2019 0416   BUN 13 10/15/2019 0416   CREATININE 0.57 10/15/2019 0416   CALCIUM 9.1 10/15/2019 0416   PROT 7.6 10/15/2019 0416   ALBUMIN 3.3 (L) 10/15/2019 0416   AST 104 (H) 10/15/2019 0416   ALT 61 (H) 10/15/2019 0416   ALKPHOS 129 (H) 10/15/2019 0416   BILITOT 2.1 (H) 10/15/2019 0416   GFRNONAA >60 10/15/2019 0416   GFRAA >60 10/15/2019 0416   Lipase     Component Value Date/Time   LIPASE 29 10/09/2019 1931       Studies/Results: No results  found.  Anti-infectives: Anti-infectives (From admission, onward)   None       Assessment/Plan POD 9, s/p lap appy now with pSBO vs ileus  - s/p laparoscopic appendectomy 10/06/19 Dr. Marlou Starks for [facility] - will try and clamp NGT today and give suppository.  Output down significantly and she is passing flatus - PICC/TPN - encouraged ambulation in halls - WBC normalized today to 10K  FEN: NPO, IVF, PICC/TPN, ok to have limited hard candy (not red) or chew gum for comfort. ID: none VTE: SCDs, SQH   LOS: 5 days    Henreitta Cea , [facility] 10/15/2019, 10:43 AM Please see Amion for pager number during day hours 7:00am-4:30pm or 7:00am -11:30am on weekends

## 2019-10-16 LAB — GLUCOSE, CAPILLARY
Glucose-Capillary: 104 mg/dL — ABNORMAL HIGH (ref 70–99)
Glucose-Capillary: 107 mg/dL — ABNORMAL HIGH (ref 70–99)
Glucose-Capillary: 113 mg/dL — ABNORMAL HIGH (ref 70–99)
Glucose-Capillary: 114 mg/dL — ABNORMAL HIGH (ref 70–99)
Glucose-Capillary: 119 mg/dL — ABNORMAL HIGH (ref 70–99)

## 2019-10-16 LAB — BASIC METABOLIC PANEL
Anion gap: 9 (ref 5–15)
BUN: 15 mg/dL (ref 8–23)
CO2: 24 mmol/L (ref 22–32)
Calcium: 9.4 mg/dL (ref 8.9–10.3)
Chloride: 104 mmol/L (ref 98–111)
Creatinine, Ser: 0.53 mg/dL (ref 0.44–1.00)
GFR calc Af Amer: >60 mL/min (ref >60)
GFR calc non Af Amer: >60 mL/min (ref >60)
Glucose, Bld: 110 mg/dL — ABNORMAL HIGH (ref 70–99)
Potassium: 4 mmol/L (ref 3.5–5.1)
Sodium: 137 mmol/L (ref 135–145)

## 2019-10-16 LAB — PHOSPHORUS: Phosphorus: 3.6 mg/dL (ref 2.5–4.6)

## 2019-10-16 LAB — MAGNESIUM: Magnesium: 2.2 mg/dL (ref 1.7–2.4)

## 2019-10-16 MED ORDER — LIP MEDEX EX OINT
TOPICAL_OINTMENT | CUTANEOUS | Status: AC
  Administered 2019-10-16: 1
  Filled 2019-10-16: qty 7

## 2019-10-16 MED ORDER — TRAVASOL 10 % IV SOLN
INTRAVENOUS | Status: DC
  Filled 2019-10-16: qty 954

## 2019-10-16 MED ORDER — INSULIN ASPART 100 UNIT/ML ~~LOC~~ SOLN: 0.0000 [IU] | Freq: Three times a day (TID) | SUBCUTANEOUS | Status: DC

## 2019-10-16 NOTE — Progress Notes (Signed)
PHARMACY - TOTAL PARENTERAL NUTRITION CONSULT NOTE   Indication: Prolonged ileus  Patient Measurements: Height: 5\' 1"  (154.9 cm) Weight: 54.6 kg (120 lb 6.4 oz) IBW/kg (Calculated) : 47.8 TPN AdjBW (KG): 57.2 Body mass index is 22.75 kg/m.  Assessment: Pt underwent lap appendectomy on 10/06/19, discharged from hospital on 10/08/19. Pt presented to ED on 4/23 with CC of emesis, constipation; concern for SBO vs ileus. Pharmacy consulted on 4/26 to initiate TPN for prolonged ileus.  Glucose / Insulin: No history of DM. Goal BG <150. CBG ranging 104-120 are WNL. No SSI required in past 24 hours.  Electrolytes: All lytes WNL.  Renal: SCr & BUN - both WNL & stable LFTs / TGs: LFT elevated 104/61 (4/29), TG 93 WNL (4/27) Prealbumin / albumin: Prealbumin 16.4 / Albumin 3.3 Intake / Output; MIVF: Unmeasured UOP x6; NGT removed this morning. BM charted 4/29. MIVF = D5 1/2 NS @ 40 mL/hr GI Imaging: 4/23 CT abdomen: SBO 4/26 Abdomen: Findings concerning for persistent degree of SBO. No free air.  Surgeries / Procedures:  4/20: laparascopic appendectomy 4/27: NGT dislodged and replaced  Central access: Double lumen PICC placed 4/26 TPN start date: 4/26  Nutritional Goals: Per RD recommendations (4/27): Kcal: 1695 - 1860 Protein: 85-100 g Fluid: >/= 1.8 L/day  Custom TPN at goal rate of 75 mL/hr will provide 95 g protein, 288 g dextrose, 1775 kcal; meeting 100% of patient needs  Current Nutrition:  TPN NGT removed 4/30 AM, diet advanced to CLD  Plan:   At 1800:  Continue TPN at goal rate of 75 mL/hr  Electrolytes in TPN: No changes  27mEq/L of Na, 105mEq/L of K, 40mEq/L of Ca, 53mEq/L of Mg, and 5mmol/L of Phos.   Cl:Ac ratio 1:1  Continue standard MVI and trace elements in TPN  Continue Sensitive SSI, reduce to q8h and adjust as needed   MIVF at 40 mL/hr. Further adjustment per MD  Monitor TPN labs on Mon/Thurs.  Lenis Noon, PharmD 10/16/2019,10:13 AM

## 2019-10-16 NOTE — Progress Notes (Signed)
Patient had bowel movement on 10/15/19. NG tube in place, clamped. Patient did not have nausea or pain this shift.

## 2019-10-16 NOTE — Progress Notes (Signed)
     Assessment & Plan: POD#10 - s/p lap appy complicated by ileus - s/p laparoscopic appendectomy 10/06/19 - Dr. Marlou Starks - discontinue NG, allow clear liquid diet today - continue PICC/TPN until GI function fully returns - encouraged ambulation in halls        Armandina Gemma, MD       [facility]       Office: [phone]   Chief Complaint: Acute appendicitis, ileus  Subjective: Patient up in chair.  BM yesterday with suppository, small flatus since.  Denies nausea or emesis.  Objective: Vital signs in last 24 hours: Temp:  [97.5 F (36.4 C)-99.7 F (37.6 C)] 97.5 F (36.4 C) (04/30 0539) Pulse Rate:  [94-98] 96 (04/30 0539) Resp:  [14-22] 14 (04/30 0539) BP: (139-144)/(87-90) 144/90 (04/30 0539) SpO2:  [98 %-100 %] 98 % (04/30 0539) Last BM Date: 10/15/19  Intake/Output from previous day: 04/29 0701 - 04/30 0700 In: 957.3 [P.O.:60; I.V.:897.3] Out: -  Intake/Output this shift: No intake/output data recorded.  Physical Exam: HEENT - sclerae clear, mucous membranes moist Neck - soft Chest - clear bilaterally Cor - RRR Abdomen - soft without distension; non-tender; few BS present Ext - no edema, non-tender Neuro - alert & oriented, no focal deficits  Lab Results:  Recent Labs    10/14/19 0332 10/15/19 0825  WBC 12.9* 10.1  HGB 11.8* 11.7*  HCT 37.3 36.1  PLT 362 374   BMET Recent Labs    10/15/19 0416 10/16/19 0355  NA 135 137  K 3.8 4.0  CL 104 104  CO2 23 24  GLUCOSE 123* 110*  BUN 13 15  CREATININE 0.57 0.53  CALCIUM 9.1 9.4   PT/INR No results for input(s): LABPROT, INR in the last 72 hours. Comprehensive Metabolic Panel:    Component Value Date/Time   NA 137 10/16/2019 0355   NA 135 10/15/2019 0416   K 4.0 10/16/2019 0355   K 3.8 10/15/2019 0416   CL 104 10/16/2019 0355   CL 104 10/15/2019 0416   CO2 24 10/16/2019 0355   CO2 23 10/15/2019 0416   BUN 15 10/16/2019 0355   BUN 13 10/15/2019 0416   CREATININE 0.53  10/16/2019 0355   CREATININE 0.57 10/15/2019 0416   GLUCOSE 110 (H) 10/16/2019 0355   GLUCOSE 123 (H) 10/15/2019 0416   CALCIUM 9.4 10/16/2019 0355   CALCIUM 9.1 10/15/2019 0416   AST 104 (H) 10/15/2019 0416   AST 19 10/13/2019 0340   ALT 61 (H) 10/15/2019 0416   ALT 12 10/13/2019 0340   ALKPHOS 129 (H) 10/15/2019 0416   ALKPHOS 50 10/13/2019 0340   BILITOT 2.1 (H) 10/15/2019 0416   BILITOT 2.2 (H) 10/13/2019 0340   PROT 7.6 10/15/2019 0416   PROT 7.8 10/13/2019 0340   ALBUMIN 3.3 (L) 10/15/2019 0416   ALBUMIN 3.6 10/13/2019 0340    Studies/Results: No results found.    Armandina Gemma 10/16/2019  Patient ID: Arlean Hopping, female   DOB: Jul 17, 1945, 74 y.o.   MRN: [id number]

## 2019-10-17 LAB — GLUCOSE, CAPILLARY
Glucose-Capillary: 101 mg/dL — ABNORMAL HIGH (ref 70–99)
Glucose-Capillary: 98 mg/dL (ref 70–99)

## 2019-10-17 MED ORDER — TRAVASOL 10 % IV SOLN
INTRAVENOUS | Status: DC
  Filled 2019-10-17: qty 954

## 2019-10-17 NOTE — Progress Notes (Signed)
PHARMACY - TOTAL PARENTERAL NUTRITION CONSULT NOTE   Indication: Prolonged ileus  Patient Measurements: Height: 5\' 1"  (154.9 cm) Weight: 54.6 kg (120 lb 6.4 oz) IBW/kg (Calculated) : 47.8 TPN AdjBW (KG): 57.2 Body mass index is 22.75 kg/m.  Assessment: Pt underwent lap appendectomy on 10/06/19, discharged from hospital on 10/08/19. Pt presented to ED on 4/23 with CC of emesis, constipation; concern for SBO vs ileus. Pharmacy consulted on 4/26 to initiate TPN for prolonged ileus.  Glucose / Insulin: No history of DM. Goal BG <150. CBG are WNL. - No SSI required in past 24 hours.  Electrolytes: All lytes WNL with labs on 4/30 Renal: SCr & BUN - both WNL & stable with labs on 4/30 LFTs / TGs: LFT elevated 104/61 (4/29), TG 93 WNL (4/27) Prealbumin / albumin: Prealbumin 16.4 / Albumin 3.3 Intake / Output; MIVF: Unmeasured UOP x6; NGT removed this morning. BM charted 4/29. MIVF = D5 1/2 NS @ 40 mL/hr GI Imaging: 4/23 CT abdomen: SBO 4/26 Abdomen: Findings concerning for persistent degree of SBO. No free air.  Surgeries / Procedures:  4/20: laparascopic appendectomy 4/27: NGT dislodged and replaced  Central access: Double lumen PICC placed 4/26 TPN start date: 4/26  Nutritional Goals: Per RD recommendations (4/27): Kcal: 1695 - 1860 Protein: 85-100 g Fluid: >/= 1.8 L/day  Custom TPN at goal rate of 75 mL/hr will provide 95 g protein, 288 g dextrose, 1775 kcal; meeting 100% of patient needs  Current Nutrition:  - TPN - NGT removed 4/30 AM, diet advanced to CLD  Plan:   At 1800:  Continue TPN at goal rate of 75 mL/hr  Electrolytes in TPN: No changes  103mEq/L of Na, 48mEq/L of K, 28mEq/L of Ca, 11mEq/L of Mg, and 42mmol/L of Phos.   Cl:Ac ratio 1:1  Continue standard MVI and trace elements in TPN  Continue Sensitive SSI, reduce to q8h and adjust as needed   MIVF at 40 mL/hr. Further adjustment per MD  Monitor TPN labs on Mon/Thurs.  Bmet on 5/2  Wilmot Quevedo P,  PharmD 10/17/2019,7:49 AM

## 2019-10-17 NOTE — Progress Notes (Signed)
Confirmed with pharmacy that TPN did not need to be weaned down before discontinued.

## 2019-10-17 NOTE — Plan of Care (Signed)
  Problem: Clinical Measurements: Goal: Ability to maintain clinical measurements within normal limits will improve Outcome: Progressing Goal: Will remain free from infection Outcome: Progressing Goal: Diagnostic test results will improve Outcome: Progressing   Problem: Elimination: Goal: Will not experience complications related to bowel motility Outcome: Progressing   Problem: Pain Managment: Goal: General experience of comfort will improve Outcome: Progressing

## 2019-10-17 NOTE — Progress Notes (Signed)
     Assessment & Plan: POD#11 - s/p lap appy complicated by ileus - s/p laparoscopic appendectomy 10/06/19 - Dr. Marlou Starks -tolerated clear liquids, advance to soft diet today - discontinue TPN this evening at [place] - encouraged ambulation in halls - if diet tolerated, plan discharge home tomorrow [place], MD       [facility]       Office: [phone]   Chief Complaint: appendicitis  Subjective: Patient ambulating in halls with nurse.  Tolerated clear liquids and had BM.  Wants to eat!  Objective: Vital signs in last 24 hours: Temp:  [98.1 F (36.7 C)-98.5 F (36.9 C)] 98.5 F (36.9 C) (05/01 0524) Pulse Rate:  [88-91] 89 (05/01 0524) Resp:  [14-16] 14 (05/01 0524) BP: (128-147)/(79-92) 144/79 (05/01 0524) SpO2:  [99 %-100 %] 99 % (05/01 0524) Last BM Date: 10/16/19  Intake/Output from previous day: 04/30 0701 - 05/01 0700 In: 1390.9 [P.O.:540; I.V.:850.9] Out: -  Intake/Output this shift: Total I/O In: 100 [P.O.:100] Out: -   Physical Exam: HEENT - sclerae clear, mucous membranes moist Neck - soft Chest - clear bilaterally Cor - RRR Abdomen - soft, protuberant, non-tender Ext - no edema, non-tender Neuro - alert & oriented, no focal deficits  Lab Results:  Recent Labs    10/15/19 0825  WBC 10.1  HGB 11.7*  HCT 36.1  PLT 374   BMET Recent Labs    10/15/19 0416 10/16/19 0355  NA 135 137  K 3.8 4.0  CL 104 104  CO2 23 24  GLUCOSE 123* 110*  BUN 13 15  CREATININE 0.57 0.53  CALCIUM 9.1 9.4   PT/INR No results for input(s): LABPROT, INR in the last 72 hours. Comprehensive Metabolic Panel:    Component Value Date/Time   NA 137 10/16/2019 0355   NA 135 10/15/2019 0416   K 4.0 10/16/2019 0355   K 3.8 10/15/2019 0416   CL 104 10/16/2019 0355   CL 104 10/15/2019 0416   CO2 24 10/16/2019 0355   CO2 23 10/15/2019 0416   BUN 15 10/16/2019 0355   BUN 13 10/15/2019 0416   CREATININE 0.53 10/16/2019 0355   CREATININE 0.57 10/15/2019 0416   GLUCOSE 110 (H) 10/16/2019 0355   GLUCOSE 123 (H) 10/15/2019 0416   CALCIUM 9.4 10/16/2019 0355   CALCIUM 9.1 10/15/2019 0416   AST 104 (H) 10/15/2019 0416   AST 19 10/13/2019 0340   ALT 61 (H) 10/15/2019 0416   ALT 12 10/13/2019 0340   ALKPHOS 129 (H) 10/15/2019 0416   ALKPHOS 50 10/13/2019 0340   BILITOT 2.1 (H) 10/15/2019 0416   BILITOT 2.2 (H) 10/13/2019 0340   PROT 7.6 10/15/2019 0416   PROT 7.8 10/13/2019 0340   ALBUMIN 3.3 (L) 10/15/2019 0416   ALBUMIN 3.6 10/13/2019 0340    Studies/Results: No results found.    Armandina Gemma 10/17/2019  Patient ID: Kelly Harrington, female   DOB: 26-Mar-1946, 74 y.o.   MRN: [id number]

## 2019-10-18 LAB — BASIC METABOLIC PANEL
Anion gap: 7 (ref 5–15)
BUN: 12 mg/dL (ref 8–23)
CO2: 25 mmol/L (ref 22–32)
Calcium: 9.3 mg/dL (ref 8.9–10.3)
Chloride: 105 mmol/L (ref 98–111)
Creatinine, Ser: 0.67 mg/dL (ref 0.44–1.00)
GFR calc Af Amer: >60 mL/min (ref >60)
GFR calc non Af Amer: >60 mL/min (ref >60)
Glucose, Bld: 91 mg/dL (ref 70–99)
Potassium: 3.9 mmol/L (ref 3.5–5.1)
Sodium: 137 mmol/L (ref 135–145)

## 2019-10-18 MED ORDER — ACETAMINOPHEN 325 MG PO TABS: 650.0000 mg | ORAL_TABLET | ORAL | Status: DC | PRN

## 2019-10-18 MED ORDER — TRAMADOL HCL 50 MG PO TABS: 50.0000 mg | ORAL_TABLET | Freq: Four times a day (QID) | ORAL | Status: DC | PRN

## 2019-10-18 NOTE — Plan of Care (Signed)
  Problem: Clinical Measurements: Goal: Ability to maintain clinical measurements within normal limits will improve Outcome: Progressing Goal: Will remain free from infection Outcome: Progressing Goal: Diagnostic test results will improve Outcome: Progressing   Problem: Elimination: Goal: Will not experience complications related to bowel motility Outcome: Progressing   Problem: Pain Managment: Goal: General experience of comfort will improve Outcome: Progressing

## 2019-10-18 NOTE — Progress Notes (Signed)
     Assessment & Plan: POD#12 -s/p lap appy complicated byileus - s/p laparoscopic appendectomy 10/06/19-Dr. Marlou Starks -tolerating soft diet; BM's yesterday; nothing overnight -some lower abdominal pain this AM, no nausea - encouraged ambulation in halls - if diet tolerated, plan discharge home tomorrow 5/3        Kelly Gemma, MD       [facility]       Office: [phone]   Chief Complaint: Appendicitis, ileus  Subjective: Patient in bed this AM, complains of lower abdominal pain.  No BM's overnight, small flatus.  Denies nausea or emesis.  Objective: Vital signs in last 24 hours: Temp:  [98.4 F (36.9 C)-99.6 F (37.6 C)] 99 F (37.2 C) (05/02 0544) Pulse Rate:  [95-106] 95 (05/02 0544) Resp:  [18-20] 18 (05/02 0544) BP: (114-142)/(81-84) 136/84 (05/02 0544) SpO2:  [99 %-100 %] 99 % (05/02 0544) Last BM Date: 10/17/19  Intake/Output from previous day: 05/01 0701 - 05/02 0700 In: 931.3 [P.O.:340; I.V.:591.3] Out: -  Intake/Output this shift: No intake/output data recorded.  Physical Exam: HEENT - sclerae clear, mucous membranes moist Neck - soft Chest - clear bilaterally Cor - RRR Abdomen - soft, protuberant; mild lower abdominal tenderness; active BS present Ext - no edema, non-tender Neuro - alert & oriented, no focal deficits  Lab Results:  No results for input(s): WBC, HGB, HCT, PLT in the last 72 hours. BMET Recent Labs    10/16/19 0355 10/18/19 0500  NA 137 137  K 4.0 3.9  CL 104 105  CO2 24 25  GLUCOSE 110* 91  BUN 15 12  CREATININE 0.53 0.67  CALCIUM 9.4 9.3   PT/INR No results for input(s): LABPROT, INR in the last 72 hours. Comprehensive Metabolic Panel:    Component Value Date/Time   NA 137 10/18/2019 0500   NA 137 10/16/2019 0355   K 3.9 10/18/2019 0500   K 4.0 10/16/2019 0355   CL 105 10/18/2019 0500   CL 104 10/16/2019 0355   CO2 25 10/18/2019 0500   CO2 24 10/16/2019 0355   BUN 12 10/18/2019 0500   BUN 15 10/16/2019 0355   CREATININE 0.67 10/18/2019 0500   CREATININE 0.53 10/16/2019 0355   GLUCOSE 91 10/18/2019 0500   GLUCOSE 110 (H) 10/16/2019 0355   CALCIUM 9.3 10/18/2019 0500   CALCIUM 9.4 10/16/2019 0355   AST 104 (H) 10/15/2019 0416   AST 19 10/13/2019 0340   ALT 61 (H) 10/15/2019 0416   ALT 12 10/13/2019 0340   ALKPHOS 129 (H) 10/15/2019 0416   ALKPHOS 50 10/13/2019 0340   BILITOT 2.1 (H) 10/15/2019 0416   BILITOT 2.2 (H) 10/13/2019 0340   PROT 7.6 10/15/2019 0416   PROT 7.8 10/13/2019 0340   ALBUMIN 3.3 (L) 10/15/2019 0416   ALBUMIN 3.6 10/13/2019 0340    Studies/Results: No results found.    Kelly Harrington 10/18/2019  Patient ID: Kelly Harrington, female   DOB: [birth year], 74 y.o.   MRN: [id number]

## 2019-10-19 MED ORDER — ADULT MULTIVITAMIN W/MINERALS CH: 1.0000 | ORAL_TABLET | Freq: Every day | ORAL | Status: DC

## 2019-10-19 MED ORDER — ENSURE ENLIVE PO LIQD
237.0000 mL | Freq: Two times a day (BID) | ORAL | Status: DC
  Administered 2019-10-19: 237 mL via ORAL

## 2019-10-19 MED ORDER — ENSURE ENLIVE PO LIQD: 237.0000 mL | Freq: Two times a day (BID) | ORAL | 12 refills | Status: DC

## 2019-10-19 NOTE — Progress Notes (Signed)
Nutrition Follow-up  DOCUMENTATION CODES:   Non-severe (moderate) malnutrition in context of acute illness/injury  INTERVENTION:  - will order Ensure Enlive BID, each supplement provides 350 kcal and 20 grams of protein. - will order daily multivitamin with minerals.    NUTRITION DIAGNOSIS:   Moderate Malnutrition related to acute illness(s/p lap appy) as evidenced by energy intake < 75% for > 7 days, percent weight loss. -ongoing  GOAL:   Patient will meet greater than or equal to 90% of their needs -minimally met  MONITOR:   PO intake, Supplement acceptance, Labs, Weight trends  ASSESSMENT:   74 y.o. female who underwent a lap appendectomy on 10/06/19 and was discharged on 10/08/19. She continued to feel nauseated after discharge and presented to the ED on 4/24 with complaints of ongoing nausea, vomiting, and feeling more bloated. CT showed SBO.  Significant Events: 4/20- lap appy 4/22- discharge 4/23- presented to the ED; NGT placed in R nare 4/24- re-admission 4/26- double lumen PICC placed in R brachial; TPN initiation 4/27- NGT replaced after being accidentally dislodged 4/30- diet advanced from NPO to CLD at 1015; NGT removed 5/1- diet advanced to Soft at 1207; TPN stopped   Per flow sheet documentation, she consumed 75% of lunch on 5/1 (442 kcal, 13 grams protein); 75% of breakfast and 100% of dinner on 5/2 (1267 kcal, 37 grams protein). She has not been weighed since 4/26.   Discharge summary entered by Surgery earlier this AM; discharge order has not yet been entered.     Labs reviewed; CBGs: 101 and 98 mg/dl on 3/2. Medications reviewed.  IVF; D5-1/2 NS @ 40 ml/hr (163 kcal).   Diet Order:   Diet Order            DIET SOFT Room service appropriate? Yes; Fluid consistency: Thin  Diet effective now              EDUCATION NEEDS:   Not appropriate for education at this time  Skin:  Skin Assessment: Reviewed RN Assessment  Last BM:  5/1  Height:    Ht Readings from Last 1 Encounters:  10/09/19 5' 1"  (1.549 m)    Weight:   Wt Readings from Last 1 Encounters:  10/12/19 54.6 kg    Ideal Body Weight:  47.7 kg  BMI:  Body mass index is 22.75 kg/m.  Estimated Nutritional Needs:   Kcal:  1695-1860 kcal  Protein:  85-100 grams  Fluid:  >/= 1.8 L/day     Jarome Matin, MS, RD, LDN, CNSC Inpatient Clinical Dietitian RD pager # available in AMION  After hours/weekend pager # available in [facility]

## 2019-10-19 NOTE — Discharge Summary (Signed)
[facility] Discharge Summary   Patient ID: Kelly Harrington MRN: [id number] DOB/AGE: 1945/08/25 74 y.o.  Admit date: 10/09/2019 Discharge date: 10/19/2019  Admitting Diagnosis: S/p laparoscopic appendectomy SBO vs Ileus  Discharge Diagnosis S/p laparoscopic appendectomy SBO vs Ileus Low-grade appendiceal mucinous neoplasm (LAMN)  Consultants None  Imaging: No results found.  Procedures None this admission Dr. Marlou Starks (10/06/2019) - Laparoscopic Appendectomy  Hospital Course:  Kelly Harrington is a 74yo female who presented to [facility] 4/23 with persistent nausea, vomiting, and bloating. She underwent laparoscopic appendectomy 4/20 by Dr. Marlou Starks and was discharged 4/22 in good condition.  She underwent CT scan which showed a SBO with a transition point in the lower abdomen/pelvis. NG tube was placed and she was admitted to the surgical service. Due to prolonged PO intolerance she was started on TPN for nutritional support. Ultimately bowel function returned, NG tube was removed and diet advanced as tolerated. On 5/3 the patient was tolerating diet, having bowel function, ambulating well, pain well controlled, vital signs stable, incisions c/d/i and felt stable for discharge home.  Patient will follow up as below and knows to call with questions or concerns.    Physical Exam: General:  Alert, NAD, pleasant, comfortable Pulm: rate and effort normal Abd:  Soft, ND, nontender, +BS, lap incisions cdi  Allergies as of 10/19/2019      Reactions   Codeine       Medication List    STOP taking these medications   ondansetron 4 MG disintegrating tablet Commonly known as: ZOFRAN-ODT   oxyCODONE 5 MG immediate release tablet Commonly known as: Oxy IR/ROXICODONE     TAKE these medications   acetaminophen 500 MG tablet Commonly known as: TYLENOL Take 500-1,000 mg by mouth every 6 (six) hours as needed for moderate pain.   atorvastatin 40 MG tablet Commonly known as:  LIPITOR Take 40 mg by mouth every Monday, Wednesday, and Friday. Take on MWF   Biotin 1 MG Caps Take 1 capsule by mouth daily.   cetirizine 10 MG tablet Commonly known as: ZYRTEC Take 10 mg by mouth daily as needed for allergies.   cholecalciferol 25 MCG (1000 UNIT) tablet Commonly known as: VITAMIN D3 Take 1,000 Units by mouth daily.   ibuprofen 200 MG tablet Commonly known as: ADVIL Take 200 mg by mouth every 6 (six) hours as needed for moderate pain.        Follow-up Information    Jovita Kussmaul, MD. Go on 10/27/2019.   Specialty: General Surgery Why: Your appointment 5/11 at 12pm Please arrive 30 minutes prior to your appointment to check in and fill out paperwork. Bring photo ID and insurance information. Contact information: [address] [place]  [address] [phone]           Signed: [place], [facility] 10/19/2019, 9:50 AM Please see Amion for pager number during day hours 7:00am-4:30pm

## 2019-10-19 NOTE — Discharge Instructions (Signed)
[facility] ° °Please arrive at least 30 min before your appointment to complete your check in paperwork.  If you are unable to arrive 30 min prior to your appointment time we may have to cancel or reschedule you. °LAPAROSCOPIC SURGERY: POST OP INSTRUCTIONS °Always review your discharge instruction sheet given to you by the facility where your surgery was performed. °IF YOU HAVE DISABILITY OR FAMILY LEAVE FORMS, YOU MUST BRING THEM TO THE OFFICE FOR PROCESSING.   °DO NOT GIVE THEM TO YOUR DOCTOR. ° °PAIN CONTROL ° °1. First take acetaminophen (Tylenol) AND/or ibuprofen (Advil) to control your pain after surgery.  Follow directions on package.  Taking acetaminophen (Tylenol) and/or ibuprofen (Advil) regularly after surgery will help to control your pain and lower the amount of prescription pain medication you may need.  You should not take more than 4,000 mg (4 grams) of acetaminophen (Tylenol) in 24 hours.  You should not take ibuprofen (Advil), aleve, motrin, naprosyn or other NSAIDS if you have a history of stomach ulcers or chronic kidney disease.  °2. A prescription for pain medication may be given to you upon discharge.  Take your pain medication as prescribed, if you still have uncontrolled pain after taking acetaminophen (Tylenol) or ibuprofen (Advil). °3. Use ice packs to help control pain. °4. If you need a refill on your pain medication, please contact your pharmacy.  They will contact our office to request authorization. Prescriptions will not be filled after 5pm or on week-ends. ° °HOME MEDICATIONS °5. Take your usually prescribed medications unless otherwise directed. ° °DIET °6. You should follow a light diet the first few days after arrival home.  Be sure to include lots of fluids daily. Avoid fatty, fried foods.  ° °CONSTIPATION °7. It is common to experience some constipation after surgery and if you are taking pain medication.  Increasing fluid intake and taking a stool  softener (such as Colace) will usually help or prevent this problem from occurring.  A mild laxative (Milk of Magnesia or Miralax) should be taken according to package instructions if there are no bowel movements after 48 hours. ° °WOUND/INCISION CARE °8. Most patients will experience some swelling and bruising in the area of the incisions.  Ice packs will help.  Swelling and bruising can take several days to resolve.  °9. Unless discharge instructions indicate otherwise, follow guidelines below  °a. STERI-STRIPS - you may remove your outer bandages 48 hours after surgery, and you may shower at that time.  You have steri-strips (small skin tapes) in place directly over the incision.  These strips should be left on the skin for 7-10 days.   °b. DERMABOND/SKIN GLUE - you may shower in 24 hours.  The glue will flake off over the next 2-3 weeks. °10. Any sutures or staples will be removed at the office during your follow-up visit. ° °ACTIVITIES °11. You may resume regular (light) daily activities beginning the next day--such as daily self-care, walking, climbing stairs--gradually increasing activities as tolerated.  You may have sexual intercourse when it is comfortable.  Refrain from any heavy lifting or straining until approved by your doctor. °a. You may drive when you are no longer taking prescription pain medication, you can comfortably wear a seatbelt, and you can safely maneuver your car and apply brakes. ° °FOLLOW-UP °12. You should see your doctor in the office for a follow-up appointment approximately 2-3 weeks after your surgery.  You should have been given your post-op/follow-up appointment when   your surgery was scheduled.  If you did not receive a post-op/follow-up appointment, make sure that you call for this appointment within a day or two after you arrive home to insure a convenient appointment time. ° °OTHER INSTRUCTIONS ° °WHEN TO CALL YOUR DOCTOR: °1. Fever over 101.0 °2. Inability to  urinate °3. Continued bleeding from incision. °4. Increased pain, redness, or drainage from the incision. °5. Increasing abdominal pain ° °The clinic staff is available to answer your questions during regular business hours.  Please don’t hesitate to call and ask to speak to one of the nurses for clinical concerns.  If you have a medical emergency, go to the nearest emergency room or call 911.  A surgeon from [facility] is always on call at the hospital. °[address], [address], [place], [place]  [address] ? [address] [address], [place], [place]   [address] °[phone] ? [phone] ? FAX [phone] ° ° ° °

## 2019-10-22 ENCOUNTER — Telehealth: Payer: Self-pay | Admitting: Nurse Practitioner

## 2019-10-22 NOTE — Telephone Encounter (Signed)
Received a new pt referral from Dr. Marlou Starks at [place] for low grade appendiceal cancer. Pt has been cld and scheduled to see Lacie on 5/10 at 1:45pm. Mrs. Malsch is aware to arrive 15 minutes early.

## 2019-10-23 NOTE — Progress Notes (Signed)
Spoke with patient introduced myself and explained my role as GI navigator.  She is still recovering from surgery.  She states mainly drinking Gatorade, eating applesauce, tried eating scrambled eggs but couldn't keep them down.  She tried the nutritional supplement that the hospital recommended but was unable to tolerate.  She is going to try Premier protein shake today.  I also suggested adding in yogurt to see if she is able to tolerate this.  She will bring her husband with her to her appointment on Monday 5/10 at 1:45.  I told her we definitely will be making a referral to our nutritionist. She verbalized an understanding.

## 2019-10-24 ENCOUNTER — Emergency Department (HOSPITAL_COMMUNITY): Payer: Medicare Other

## 2019-10-24 ENCOUNTER — Other Ambulatory Visit: Payer: Self-pay

## 2019-10-24 ENCOUNTER — Inpatient Hospital Stay (HOSPITAL_COMMUNITY)
Admission: EM | Admit: 2019-10-24 | Discharge: 2019-11-04 | DRG: 389 | Disposition: A | Discharge status: Home / Self Care | Payer: Medicare Other | Attending: Surgery | Admitting: Surgery

## 2019-10-24 ENCOUNTER — Encounter (HOSPITAL_COMMUNITY): Payer: Self-pay | Admitting: Emergency Medicine

## 2019-10-24 DIAGNOSIS — I10 Essential (primary) hypertension: Secondary | ICD-10-CM | POA: Diagnosis present

## 2019-10-24 DIAGNOSIS — Z9049 Acquired absence of other specified parts of digestive tract: Secondary | ICD-10-CM

## 2019-10-24 DIAGNOSIS — K56609 Unspecified intestinal obstruction, unspecified as to partial versus complete obstruction: Secondary | ICD-10-CM

## 2019-10-24 DIAGNOSIS — Z87891 Personal history of nicotine dependence: Secondary | ICD-10-CM

## 2019-10-24 DIAGNOSIS — D649 Anemia, unspecified: Secondary | ICD-10-CM | POA: Diagnosis present

## 2019-10-24 DIAGNOSIS — E876 Hypokalemia: Secondary | ICD-10-CM | POA: Diagnosis not present

## 2019-10-24 DIAGNOSIS — Z79899 Other long term (current) drug therapy: Secondary | ICD-10-CM

## 2019-10-24 DIAGNOSIS — Z791 Long term (current) use of non-steroidal anti-inflammatories (NSAID): Secondary | ICD-10-CM

## 2019-10-24 DIAGNOSIS — K913 Postprocedural intestinal obstruction, unspecified as to partial versus complete: Secondary | ICD-10-CM | POA: Diagnosis not present

## 2019-10-24 DIAGNOSIS — R9431 Abnormal electrocardiogram [ECG] [EKG]: Secondary | ICD-10-CM | POA: Diagnosis present

## 2019-10-24 DIAGNOSIS — I2510 Atherosclerotic heart disease of native coronary artery without angina pectoris: Secondary | ICD-10-CM | POA: Diagnosis present

## 2019-10-24 DIAGNOSIS — R140 Abdominal distension (gaseous): Secondary | ICD-10-CM

## 2019-10-24 DIAGNOSIS — E785 Hyperlipidemia, unspecified: Secondary | ICD-10-CM | POA: Diagnosis present

## 2019-10-24 DIAGNOSIS — N179 Acute kidney failure, unspecified: Secondary | ICD-10-CM | POA: Diagnosis present

## 2019-10-24 DIAGNOSIS — E7800 Pure hypercholesterolemia, unspecified: Secondary | ICD-10-CM | POA: Diagnosis present

## 2019-10-24 DIAGNOSIS — D373 Neoplasm of uncertain behavior of appendix: Secondary | ICD-10-CM

## 2019-10-24 DIAGNOSIS — E860 Dehydration: Secondary | ICD-10-CM | POA: Diagnosis present

## 2019-10-24 DIAGNOSIS — Y838 Other surgical procedures as the cause of abnormal reaction of the patient, or of later complication, without mention of misadventure at the time of the procedure: Secondary | ICD-10-CM | POA: Diagnosis present

## 2019-10-24 DIAGNOSIS — Z8249 Family history of ischemic heart disease and other diseases of the circulatory system: Secondary | ICD-10-CM

## 2019-10-24 DIAGNOSIS — Z20822 Contact with and (suspected) exposure to covid-19: Secondary | ICD-10-CM | POA: Diagnosis present

## 2019-10-24 HISTORY — DX: Unspecified intestinal obstruction, unspecified as to partial versus complete obstruction: K56.609

## 2019-10-24 MED ORDER — FENTANYL CITRATE (PF) 100 MCG/2ML IJ SOLN
100.0000 ug | Freq: Once | INTRAMUSCULAR | Status: AC
  Administered 2019-10-25: 01:00:00 100 ug via INTRAVENOUS
  Filled 2019-10-24: qty 2

## 2019-10-24 MED ORDER — SODIUM CHLORIDE 0.9% FLUSH
3.0000 mL | Freq: Once | INTRAVENOUS | Status: AC
  Administered 2019-10-25: 3 mL via INTRAVENOUS

## 2019-10-24 MED ORDER — ONDANSETRON HCL 4 MG/2ML IJ SOLN
4.0000 mg | Freq: Once | INTRAMUSCULAR | Status: AC
  Administered 2019-10-25: 4 mg via INTRAVENOUS
  Filled 2019-10-24: qty 2

## 2019-10-24 MED ORDER — SODIUM CHLORIDE 0.9 % IV BOLUS
1000.0000 mL | Freq: Once | INTRAVENOUS | Status: AC
  Administered 2019-10-25: 1000 mL via INTRAVENOUS

## 2019-10-24 MED ORDER — SODIUM CHLORIDE 0.9 % IV SOLN: Freq: Once | INTRAVENOUS | Status: AC

## 2019-10-24 NOTE — ED Provider Notes (Signed)
[facility] DEPT Provider Note: Georgena Spurling, MD, FACEP  CSN: [id number] MRN: [id number] ARRIVAL: 10/24/19 at 2243 ROOM: WA09/WA09   CHIEF COMPLAINT  Abdominal Pain   HISTORY OF PRESENT ILLNESS  10/24/19 11:39 PM Kelly Harrington is a 74 y.o. female with a history of small bowel obstruction admission 10/09/2019 status post appendectomy admission on 10/05/2019.  She is here with abdominal pain and no bowel movement in the past 5 days ("since I got out of the hospital" 10/19/2019).  This has been associated with bilious vomiting and inability to tolerate oral intake which began yesterday.  She is having lower abdominal pain which she rates as a 6 out of 10, not significantly worse with palpation or movement.   Past Medical History:  Diagnosis Date  . High cholesterol   . SBO (small bowel obstruction) (HCC)     Past Surgical History:  Procedure Laterality Date  . APPENDECTOMY    . LAPAROSCOPIC APPENDECTOMY N/A 10/06/2019   Procedure: APPENDECTOMY LAPAROSCOPIC;  Surgeon: Jovita Kussmaul, MD;  Location: [facility];  Service: General;  Laterality: N/A;  . ROTATOR CUFF REPAIR    . TRIGGER FINGER RELEASE      Family History  Problem Relation Age of Onset  . Heart failure Mother   . Hypertension Mother   . Heart failure Father   . Hypertension Father     Social History   Tobacco Use  . Smoking status: Never Smoker  . Smokeless tobacco: Never Used  Substance Use Topics  . Alcohol use: No  . Drug use: No    Prior to Admission medications   Medication Sig Start Date End Date Taking? Authorizing Provider  acetaminophen (TYLENOL) 500 MG tablet Take 500-1,000 mg by mouth every 6 (six) hours as needed for moderate pain.    [provider]  atorvastatin (LIPITOR) 40 MG tablet Take 40 mg by mouth every Monday, Wednesday, and Friday. Take on MWF    [provider]  Biotin 1 MG CAPS Take 1 capsule by mouth daily.    [provider]  cetirizine (ZYRTEC) 10  MG tablet Take 10 mg by mouth daily as needed for allergies.    [provider]  cholecalciferol (VITAMIN D3) 25 MCG (1000 UNIT) tablet Take 1,000 Units by mouth daily.    [provider]  feeding supplement, ENSURE ENLIVE, (ENSURE ENLIVE) LIQD Take 237 mLs by mouth 2 (two) times daily between meals. 10/19/19   Meuth, Brooke A, PA-C  ibuprofen (ADVIL,MOTRIN) 200 MG tablet Take 200 mg by mouth every 6 (six) hours as needed for moderate pain.     [provider]  Multiple Vitamin (MULTIVITAMIN WITH MINERALS) TABS tablet Take 1 tablet by mouth daily. 10/19/19   Meuth, Brooke A, PA-C    Allergies Codeine   REVIEW OF SYSTEMS  Negative except as noted here or in the History of Present Illness.   PHYSICAL EXAMINATION  Initial Vital Signs Blood pressure (!) 141/107, pulse (!) 114, temperature 97.7 F (36.5 C), temperature source Oral, resp. rate (!) 24, SpO2 96 %.  Examination General: Well-developed, well-nourished female in no acute distress; appearance consistent with age of record HENT: normocephalic; atraumatic Eyes: pupils equal, round and reactive to light; extraocular muscles intact Neck: supple Heart: regular rate and rhythm; tachycardic Lungs: clear to auscultation bilaterally Abdomen: soft; distended; diffusely tender; bowel sounds hypoactive Extremities: No deformity; full range of motion; pulses normal Neurologic: Awake, alert and oriented; motor function intact in all extremities and symmetric;  no facial droop Skin: Warm and dry Psychiatric: Normal mood and affect   RESULTS  Summary of this visit's results, reviewed and interpreted by myself:   EKG Interpretation  Date/Time:    Ventricular Rate:    PR Interval:    QRS Duration:   QT Interval:    QTC Calculation:   R Axis:     Text Interpretation:        Laboratory Studies: Results for orders placed or performed during the hospital encounter of 10/24/19 (from the past 24 hour(s))    Lipase, blood     Status: None   Collection Time: 10/24/19 10:58 PM  Result Value Ref Range   Lipase 42 11 - 51 U/L  Comprehensive metabolic panel     Status: Abnormal   Collection Time: 10/24/19 10:58 PM  Result Value Ref Range   Sodium 132 (L) 135 - 145 mmol/L   Potassium 3.4 (L) 3.5 - 5.1 mmol/L   Chloride 88 (L) 98 - 111 mmol/L   CO2 26 22 - 32 mmol/L   Glucose, Bld 106 (H) 70 - 99 mg/dL   BUN 55 (H) 8 - 23 mg/dL   Creatinine, Ser 1.52 (H) 0.44 - 1.00 mg/dL   Calcium 10.7 (H) 8.9 - 10.3 mg/dL   Total Protein 9.3 (H) 6.5 - 8.1 g/dL   Albumin 5.1 (H) 3.5 - 5.0 g/dL   AST 29 15 - 41 U/L   ALT 35 0 - 44 U/L   Alkaline Phosphatase 81 38 - 126 U/L   Total Bilirubin 2.1 (H) 0.3 - 1.2 mg/dL   GFR calc non Af Amer 34 (L) >60 mL/min   GFR calc Af Amer 40 (L) >60 mL/min   Anion gap 18 (H) 5 - 15  CBC with Differential/Platelet     Status: Abnormal   Collection Time: 10/24/19 11:41 PM  Result Value Ref Range   WBC 10.1 4.0 - 10.5 K/uL   RBC 4.53 3.87 - 5.11 MIL/uL   Hemoglobin 13.5 12.0 - 15.0 g/dL   HCT 40.7 36.0 - 46.0 %   MCV 89.8 80.0 - 100.0 fL   MCH 29.8 26.0 - 34.0 pg   MCHC 33.2 30.0 - 36.0 g/dL   RDW 12.6 11.5 - 15.5 %   Platelets 489 (H) 150 - 400 K/uL   nRBC 0.0 0.0 - 0.2 %   Neutrophils Relative % 78 %   Neutro Abs 7.8 (H) 1.7 - 7.7 K/uL   Lymphocytes Relative 15 %   Lymphs Abs 1.6 0.7 - 4.0 K/uL   Monocytes Relative 7 %   Monocytes Absolute 0.7 0.1 - 1.0 K/uL   Eosinophils Relative 0 %   Eosinophils Absolute 0.0 0.0 - 0.5 K/uL   Basophils Relative 0 %   Basophils Absolute 0.0 0.0 - 0.1 K/uL   Immature Granulocytes 0 %   Abs Immature Granulocytes 0.04 0.00 - 0.07 K/uL   Imaging Studies: DG ABD ACUTE 2+V W 1V CHEST  Result Date: 10/24/2019 CLINICAL DATA:  No bowel movement for 4 days, history of bowel obstruction, vomiting EXAM: DG ABDOMEN ACUTE W/ 1V CHEST COMPARISON:  10/13/2019 FINDINGS: Supine and upright frontal views of the abdomen as well as an upright  frontal view of the chest are obtained. Cardiac silhouette is unremarkable. No airspace disease, effusion, or pneumothorax. Since the prior exam, there is been marked increasing caliber of distended gas-filled small bowel, now measuring up to 5.4 cm in diameter. There is a paucity of distal bowel gas,  consistent with high-grade small bowel obstruction. Numerous gas fluid levels are identified. No free gas in the greater peritoneal sac. IMPRESSION: 1. High-grade small-bowel obstruction, with increased caliber of the small bowel since prior study. Electronically Signed   By: Randa Ngo M.D.   On: 10/24/2019 23:34    ED COURSE and MDM  Nursing notes, initial and subsequent vitals signs, including pulse oximetry, reviewed and interpreted by myself.  Vitals:   10/24/19 2258  BP: (!) 141/107  Pulse: (!) 114  Resp: (!) 24  Temp: 97.7 F (36.5 C)  TempSrc: Oral  SpO2: 96%   Medications  0.9 %  sodium chloride infusion (has no administration in time range)  sodium chloride flush (NS) 0.9 % injection 3 mL (3 mLs Intravenous Given by Other 10/25/19 0103)  ondansetron (ZOFRAN) injection 4 mg (4 mg Intravenous Given 10/25/19 0013)  fentaNYL (SUBLIMAZE) injection 100 mcg (100 mcg Intravenous Given 10/25/19 0040)  sodium chloride 0.9 % bolus 1,000 mL (1,000 mLs Intravenous New Bag/Given 10/25/19 0013)   1:08 AM Nasogastric tube placed for high-grade small bowel obstruction.  IV fluids given for AKA.   1:18 AM Nasogastric tube placed by nursing staff and appears to be in place.  Dr. Ninfa Linden of general surgery consulted for admission.   PROCEDURES  Procedures   ED DIAGNOSES     ICD-10-CM   1. SBO (small bowel obstruction) ([facility])  K56.609   2. AKI (acute kidney injury) ([facility])  N17.9        Gila Lauf, MD 10/25/19 [phone]

## 2019-10-24 NOTE — ED Triage Notes (Signed)
Patient states that she has not had a regular bowel movement in 4 days. Patient admitted recently for SBO r/t appendectomy. Patient endorses bilious vomit. Patient has not been able to tolerate PO. Patient complaining of lower abdominal pain.

## 2019-10-25 ENCOUNTER — Emergency Department (HOSPITAL_COMMUNITY): Payer: Medicare Other

## 2019-10-25 ENCOUNTER — Inpatient Hospital Stay (HOSPITAL_COMMUNITY): Payer: Medicare Other

## 2019-10-25 ENCOUNTER — Other Ambulatory Visit: Payer: Self-pay

## 2019-10-25 ENCOUNTER — Encounter (HOSPITAL_COMMUNITY): Payer: Self-pay | Admitting: Surgery

## 2019-10-25 DIAGNOSIS — Z20822 Contact with and (suspected) exposure to covid-19: Secondary | ICD-10-CM | POA: Diagnosis present

## 2019-10-25 DIAGNOSIS — D649 Anemia, unspecified: Secondary | ICD-10-CM | POA: Diagnosis present

## 2019-10-25 DIAGNOSIS — Z87891 Personal history of nicotine dependence: Secondary | ICD-10-CM | POA: Diagnosis not present

## 2019-10-25 DIAGNOSIS — I2510 Atherosclerotic heart disease of native coronary artery without angina pectoris: Secondary | ICD-10-CM | POA: Diagnosis present

## 2019-10-25 DIAGNOSIS — E7800 Pure hypercholesterolemia, unspecified: Secondary | ICD-10-CM | POA: Diagnosis present

## 2019-10-25 DIAGNOSIS — E860 Dehydration: Secondary | ICD-10-CM | POA: Diagnosis present

## 2019-10-25 DIAGNOSIS — K913 Postprocedural intestinal obstruction, unspecified as to partial versus complete: Secondary | ICD-10-CM | POA: Diagnosis present

## 2019-10-25 DIAGNOSIS — R9431 Abnormal electrocardiogram [ECG] [EKG]: Secondary | ICD-10-CM | POA: Diagnosis present

## 2019-10-25 DIAGNOSIS — K56609 Unspecified intestinal obstruction, unspecified as to partial versus complete obstruction: Secondary | ICD-10-CM | POA: Diagnosis present

## 2019-10-25 DIAGNOSIS — Z791 Long term (current) use of non-steroidal anti-inflammatories (NSAID): Secondary | ICD-10-CM | POA: Diagnosis not present

## 2019-10-25 DIAGNOSIS — D373 Neoplasm of uncertain behavior of appendix: Secondary | ICD-10-CM | POA: Diagnosis not present

## 2019-10-25 DIAGNOSIS — Z9049 Acquired absence of other specified parts of digestive tract: Secondary | ICD-10-CM | POA: Diagnosis not present

## 2019-10-25 DIAGNOSIS — E876 Hypokalemia: Secondary | ICD-10-CM | POA: Diagnosis not present

## 2019-10-25 DIAGNOSIS — Z79899 Other long term (current) drug therapy: Secondary | ICD-10-CM | POA: Diagnosis not present

## 2019-10-25 DIAGNOSIS — N179 Acute kidney failure, unspecified: Secondary | ICD-10-CM | POA: Diagnosis present

## 2019-10-25 DIAGNOSIS — Y838 Other surgical procedures as the cause of abnormal reaction of the patient, or of later complication, without mention of misadventure at the time of the procedure: Secondary | ICD-10-CM | POA: Diagnosis present

## 2019-10-25 DIAGNOSIS — Z8249 Family history of ischemic heart disease and other diseases of the circulatory system: Secondary | ICD-10-CM | POA: Diagnosis not present

## 2019-10-25 DIAGNOSIS — I10 Essential (primary) hypertension: Secondary | ICD-10-CM | POA: Diagnosis present

## 2019-10-25 DIAGNOSIS — E785 Hyperlipidemia, unspecified: Secondary | ICD-10-CM | POA: Diagnosis present

## 2019-10-25 LAB — CBC
HCT: 36.3 % (ref 36.0–46.0)
Hemoglobin: 11.9 g/dL — ABNORMAL LOW (ref 12.0–15.0)
MCH: 30 pg (ref 26.0–34.0)
MCHC: 32.8 g/dL (ref 30.0–36.0)
MCV: 91.4 fL (ref 80.0–100.0)
Platelets: 454 10*3/uL — ABNORMAL HIGH (ref 150–400)
RBC: 3.97 MIL/uL (ref 3.87–5.11)
RDW: 12.5 % (ref 11.5–15.5)
WBC: 9.1 10*3/uL (ref 4.0–10.5)
nRBC: 0 % (ref 0.0–0.2)

## 2019-10-25 LAB — CBC WITH DIFFERENTIAL/PLATELET
Abs Immature Granulocytes: 0.04 10*3/uL (ref 0.00–0.07)
Basophils Absolute: 0 10*3/uL (ref 0.0–0.1)
Basophils Relative: 0 %
Eosinophils Absolute: 0 10*3/uL (ref 0.0–0.5)
Eosinophils Relative: 0 %
HCT: 40.7 % (ref 36.0–46.0)
Hemoglobin: 13.5 g/dL (ref 12.0–15.0)
Immature Granulocytes: 0 %
Lymphocytes Relative: 15 %
Lymphs Abs: 1.6 10*3/uL (ref 0.7–4.0)
MCH: 29.8 pg (ref 26.0–34.0)
MCHC: 33.2 g/dL (ref 30.0–36.0)
MCV: 89.8 fL (ref 80.0–100.0)
Monocytes Absolute: 0.7 10*3/uL (ref 0.1–1.0)
Monocytes Relative: 7 %
Neutro Abs: 7.8 10*3/uL — ABNORMAL HIGH (ref 1.7–7.7)
Neutrophils Relative %: 78 %
Platelets: 489 10*3/uL — ABNORMAL HIGH (ref 150–400)
RBC: 4.53 MIL/uL (ref 3.87–5.11)
RDW: 12.6 % (ref 11.5–15.5)
WBC: 10.1 10*3/uL (ref 4.0–10.5)
nRBC: 0 % (ref 0.0–0.2)

## 2019-10-25 LAB — URINALYSIS, ROUTINE W REFLEX MICROSCOPIC
Bilirubin Urine: NEGATIVE
Glucose, UA: NEGATIVE mg/dL
Hgb urine dipstick: NEGATIVE
Ketones, ur: 20 mg/dL — AB
Leukocytes,Ua: NEGATIVE
Nitrite: NEGATIVE
Protein, ur: NEGATIVE mg/dL
Specific Gravity, Urine: 1.026 (ref 1.005–1.030)
pH: 5 (ref 5.0–8.0)

## 2019-10-25 LAB — COMPREHENSIVE METABOLIC PANEL
ALT: 35 U/L (ref 0–44)
AST: 29 U/L (ref 15–41)
Albumin: 5.1 g/dL — ABNORMAL HIGH (ref 3.5–5.0)
Alkaline Phosphatase: 81 U/L (ref 38–126)
Anion gap: 18 — ABNORMAL HIGH (ref 5–15)
BUN: 55 mg/dL — ABNORMAL HIGH (ref 8–23)
CO2: 26 mmol/L (ref 22–32)
Calcium: 10.7 mg/dL — ABNORMAL HIGH (ref 8.9–10.3)
Chloride: 88 mmol/L — ABNORMAL LOW (ref 98–111)
Creatinine, Ser: 1.52 mg/dL — ABNORMAL HIGH (ref 0.44–1.00)
GFR calc Af Amer: 40 mL/min — ABNORMAL LOW (ref >60)
GFR calc non Af Amer: 34 mL/min — ABNORMAL LOW (ref >60)
Glucose, Bld: 106 mg/dL — ABNORMAL HIGH (ref 70–99)
Potassium: 3.4 mmol/L — ABNORMAL LOW (ref 3.5–5.1)
Sodium: 132 mmol/L — ABNORMAL LOW (ref 135–145)
Total Bilirubin: 2.1 mg/dL — ABNORMAL HIGH (ref 0.3–1.2)
Total Protein: 9.3 g/dL — ABNORMAL HIGH (ref 6.5–8.1)

## 2019-10-25 LAB — BASIC METABOLIC PANEL
Anion gap: 14 (ref 5–15)
BUN: 45 mg/dL — ABNORMAL HIGH (ref 8–23)
CO2: 24 mmol/L (ref 22–32)
Calcium: 9.3 mg/dL (ref 8.9–10.3)
Chloride: 94 mmol/L — ABNORMAL LOW (ref 98–111)
Creatinine, Ser: 1 mg/dL (ref 0.44–1.00)
GFR calc Af Amer: >60 mL/min (ref >60)
GFR calc non Af Amer: 57 mL/min — ABNORMAL LOW (ref >60)
Glucose, Bld: 88 mg/dL (ref 70–99)
Potassium: 3.5 mmol/L (ref 3.5–5.1)
Sodium: 132 mmol/L — ABNORMAL LOW (ref 135–145)

## 2019-10-25 LAB — RESPIRATORY PANEL BY RT PCR (FLU A&B, COVID)
Influenza A by PCR: NEGATIVE
Influenza B by PCR: NEGATIVE
SARS Coronavirus 2 by RT PCR: NEGATIVE

## 2019-10-25 LAB — LIPASE, BLOOD: Lipase: 42 U/L (ref 11–51)

## 2019-10-25 MED ORDER — ONDANSETRON 4 MG PO TBDP: 4.0000 mg | ORAL_TABLET | Freq: Four times a day (QID) | ORAL | Status: DC | PRN

## 2019-10-25 MED ORDER — POTASSIUM CHLORIDE IN NACL 20-0.9 MEQ/L-% IV SOLN
INTRAVENOUS | Status: DC
  Filled 2019-10-25 (×13): qty 1000

## 2019-10-25 MED ORDER — LIP MEDEX EX OINT
TOPICAL_OINTMENT | CUTANEOUS | Status: AC
  Administered 2019-10-25: 1
  Filled 2019-10-25: qty 7

## 2019-10-25 MED ORDER — ENOXAPARIN SODIUM 30 MG/0.3ML ~~LOC~~ SOLN
30.0000 mg | SUBCUTANEOUS | Status: DC
  Administered 2019-10-25 – 2019-10-26 (×2): 30 mg via SUBCUTANEOUS
  Filled 2019-10-25 (×3): qty 0.3

## 2019-10-25 MED ORDER — ONDANSETRON HCL 4 MG/2ML IJ SOLN
4.0000 mg | Freq: Four times a day (QID) | INTRAMUSCULAR | Status: DC | PRN
  Administered 2019-10-25 – 2019-10-31 (×2): 4 mg via INTRAVENOUS
  Filled 2019-10-25 (×2): qty 2

## 2019-10-25 MED ORDER — METOPROLOL TARTRATE 5 MG/5ML IV SOLN: 2.5000 mg | Freq: Four times a day (QID) | INTRAVENOUS | Status: DC

## 2019-10-25 MED ORDER — FENTANYL CITRATE (PF) 100 MCG/2ML IJ SOLN
12.5000 ug | INTRAMUSCULAR | Status: DC | PRN
  Administered 2019-10-31: 12.5 ug via INTRAVENOUS
  Filled 2019-10-25: qty 2

## 2019-10-25 MED ORDER — DIATRIZOATE MEGLUMINE & SODIUM 66-10 % PO SOLN
90.0000 mL | Freq: Once | ORAL | Status: AC
  Administered 2019-10-25: 90 mL via NASOGASTRIC
  Filled 2019-10-25: qty 90

## 2019-10-25 MED ORDER — METOPROLOL TARTRATE 5 MG/5ML IV SOLN: 2.5000 mg | Freq: Four times a day (QID) | INTRAVENOUS | Status: DC | PRN

## 2019-10-25 NOTE — ED Notes (Signed)
ED TO INPATIENT HANDOFF REPORT  ED Nurse Name and Phone #: jon wled   S Name/Age/Gender Kelly Harrington 74 y.o. female Room/Bed: WA09/WA09  Code Status   Code Status: Prior  Home/SNF/Other Home {Patient oriented Is this baseline? Yes   Triage Complete: Triage complete  Chief Complaint SBO (small bowel obstruction) ([facility] [place]) [id number]  Triage Note Patient states that she has not had a regular bowel movement in 4 days. Patient admitted recently for SBO r/t appendectomy. Patient endorses bilious vomit. Patient has not been able to tolerate PO. Patient complaining of lower abdominal pain.     Allergies Allergies  Allergen Reactions  . Codeine     Level of Care/Admitting Diagnosis ED Disposition    ED Disposition Condition [facility] Area: [facility] [place] [[id number]]  Level of Care: Med-Surg [16]  May admit patient to [facility] or [facility] if equivalent level of care is available:: No  Covid Evaluation: Asymptomatic Screening Protocol (No Symptoms)  Diagn[facility]: [facility]small bowel obstruction) [id number]  Admitting Physician: [place], [place]  Attending Physician: CCS, MD [[id number]]  Estimated length of stay: past midnight tomorrow  Certification:: I certify this patient will need inpatient services for at least 2 midnights       B Medical/Surgery History Past Medical History:  Diagnosis Date  . High cholesterol   . SBO (small bowel obstruction) (HCC)    Past Surgical History:  Procedure Laterality Date  . APPENDECTOMY    . LAPAROSCOPIC APPENDECTOMY N/A 10/06/2019   Procedure: APPENDECTOMY LAPAROSCOPIC;  Surgeon: Jovita Kussmaul, MD;  Location: [facility];  Service: General;  Laterality: N/A;  . ROTATOR CUFF REPAIR    . TRIGGER FINGER RELEASE       A IV Location/Drains/Wounds Patient Lines/Drains/Airways Status   Active Line/Drains/Airways    Name:   Placement date:   Placement time:   Site:   Days:   Peripheral IV  10/25/19 Left;Anterior Forearm   10/25/19    0012    Forearm   less than 1   NG/OG Tube Nasogastric 16 Fr. Left nare Aucultation Documented cm marking at nare/ corner of mouth   10/25/19    0104    Left nare   less than 1   Incision - 3 Ports Abdomen Umbilicus Right;Medial Right;Lateral   10/06/19    1130     19          Intake/Output Last 24 hours No intake or output data in the 24 hours ending 10/25/19 0157  Labs/Imaging Results for orders placed or performed during the hospital encounter of 10/24/19 (from the past 48 hour(s))  Lipase, blood     Status: None   Collection Time: 10/24/19 10:58 PM  Result Value Ref Range   Lipase 42 11 - 51 U/L    Comment: Performed at [facility], [place] [address], [place], [place] [address]  Comprehensive metabolic panel     Status: Abnormal   Collection Time: 10/24/19 10:58 PM  Result Value Ref Range   Sodium 132 (L) 135 - 145 mmol/L   Potassium 3.4 (L) 3.5 - 5.1 mmol/L   Chloride 88 (L) 98 - 111 mmol/L   CO2 26 22 - 32 mmol/L   Glucose, Bld 106 (H) 70 - 99 mg/dL    Comment: Glucose reference range applies only to samples taken after fasting for at least 8 hours.   BUN 55 (H) 8 - 23 mg/dL   Creatinine, Ser  1.52 (H) 0.44 - 1.00 mg/dL   Calcium 10.7 (H) 8.9 - 10.3 mg/dL   Total Protein 9.3 (H) 6.5 - 8.1 g/dL   Albumin 5.1 (H) 3.5 - 5.0 g/dL   AST 29 15 - 41 U/L   ALT 35 0 - 44 U/L   Alkaline Phosphatase 81 38 - 126 U/L   Total Bilirubin 2.1 (H) 0.3 - 1.2 mg/dL   GFR calc non Af Amer 34 (L) >60 mL/min   GFR calc Af Amer 40 (L) >60 mL/min   Anion gap 18 (H) 5 - 15    Comment: Performed at [facility], [place] [address] [place], [place] [address]  CBC with Differential/Platelet     Status: Abnormal   Collection Time: 10/24/19 11:41 PM  Result Value Ref Range   WBC 10.1 4.0 - 10.5 K/uL   RBC 4.53 3.87 - 5.11 MIL/uL   Hemoglobin 13.5 12.0 - 15.0 g/dL   HCT 40.7 36.0 - 46.0 %   MCV 89.8 80.0 - 100.0 fL    MCH 29.8 26.0 - 34.0 pg   MCHC 33.2 30.0 - 36.0 g/dL   RDW 12.6 11.5 - 15.5 %   Platelets 489 (H) 150 - 400 K/uL   nRBC 0.0 0.0 - 0.2 %   Neutrophils Relative % 78 %   Neutro Abs 7.8 (H) 1.7 - 7.7 K/uL   Lymphocytes Relative 15 %   Lymphs Abs 1.6 0.7 - 4.0 K/uL   Monocytes Relative 7 %   Monocytes Absolute 0.7 0.1 - 1.0 K/uL   Eosinophils Relative 0 %   Eosinophils Absolute 0.0 0.0 - 0.5 K/uL   Basophils Relative 0 %   Basophils Absolute 0.0 0.0 - 0.1 K/uL   Immature Granulocytes 0 %   Abs Immature Granulocytes 0.04 0.00 - 0.07 K/uL    Comment: Performed at [facility], [place] [address] [place], [place] [address]   DG Abdomen 1 View  Result Date: 10/25/2019 CLINICAL DATA:  Enteric catheter placement EXAM: ABDOMEN - 1 VIEW COMPARISON:  10/24/2019 FINDINGS: Frontal view of the lower chest and upper abdomen demonstrates enteric catheter coiled over gastric fundus. Persistent small bowel obstruction. IMPRESSION: 1. Enteric catheter over gastric fundus. Electronically Signed   By: Randa Ngo M.D.   On: 10/25/2019 01:25   DG ABD ACUTE 2+V W 1V CHEST  Result Date: 10/24/2019 CLINICAL DATA:  No bowel movement for 4 days, history of bowel obstruction, vomiting EXAM: DG ABDOMEN ACUTE W/ 1V CHEST COMPARISON:  10/13/2019 FINDINGS: Supine and upright frontal views of the abdomen as well as an upright frontal view of the chest are obtained. Cardiac silhouette is unremarkable. No airspace disease, effusion, or pneumothorax. Since the prior exam, there is been marked increasing caliber of distended gas-filled small bowel, now measuring up to 5.4 cm in diameter. There is a paucity of distal bowel gas, consistent with high-grade small bowel obstruction. Numerous gas fluid levels are identified. No free gas in the greater peritoneal sac. IMPRESSION: 1. High-grade small-bowel obstruction, with increased caliber of the small bowel since prior study. Electronically Signed   By: Randa Ngo  M.D.   On: 10/24/2019 23:34    Pending Labs Unresulted Labs (From admission, onward)    Start     Ordered   10/25/19 0109  Respiratory Panel by RT PCR (Flu A&B, Covid) - Nasopharyngeal Swab  (Tier 2 Respiratory Panel by RT PCR (Flu A&B, Covid) (TAT 2 hrs))  Once,   STAT  Question Answer Comment  Is this test for diagnosis or screening Screening   Symptomatic for COVID-19 as defined by CDC No   Hospitalized for COVID-19 No   Admitted to ICU for COVID-19 No   Previously tested for COVID-19 Yes   Resident in a congregate (group) care setting No   Employed in healthcare setting No   Pregnant No   Has patient completed COVID vaccination(s) (2 doses of Pfizer/Moderna 1 dose of [employer]) Unknown      10/25/19 0108   10/24/19 2258  Urinalysis, Routine w reflex microscopic  ONCE - STAT,   STAT     10/24/19 2257   Signed and Held  Basic metabolic panel  Tomorrow morning,   R     Signed and Held   Signed and Held  CBC  Tomorrow morning,   R     Signed and Held          Vitals/Pain Today's Vitals   10/24/19 2258 10/25/19 0039 10/25/19 0102 10/25/19 0128  BP: (!) 141/107   (!) 151/94  Pulse: (!) 114   93  Resp: (!) 24   20  Temp: 97.7 F (36.5 C)   97.9 F (36.6 C)  TempSrc: Oral   Oral  SpO2: 96%   94%  PainSc:  5  4  0-No pain    Isolation Precautions No active isolations  Medications Medications  sodium chloride flush (NS) 0.9 % injection 3 mL (3 mLs Intravenous Given by Other 10/25/19 0103)  ondansetron (ZOFRAN) injection 4 mg (4 mg Intravenous Given 10/25/19 0013)  fentaNYL (SUBLIMAZE) injection 100 mcg (100 mcg Intravenous Given 10/25/19 0040)  sodium chloride 0.9 % bolus 1,000 mL (0 mLs Intravenous Stopped 10/25/19 0127)  0.9 %  sodium chloride infusion ( Intravenous Stopped 10/25/19 0156)    Mobility walks Low fall risk   Focused Assessments Gi obstruction    R Recommendations: See Admitting Provider Note  Report given to:   Additional Notes:

## 2019-10-25 NOTE — H&P (Signed)
Kelly Harrington is an 74 y.o. female.   Chief Complaint: Abdominal distention HPI: This is a 74 year old female who underwent a laparoscopic appendectomy for acute appendicitis on 4/20.  She was discharged home on 4/22 but then readmitted on 4/23 with a small bowel obstruction versus ileus.  She improved with conservative management and was discharged home on 5/3 tolerating a diet and having bowel movements.  She reports she initially was doing well but over the last several days has not had a bowel movement.  She took a suppository yesterday and did have some diarrhea but because of worsening distention presented to the emergency department.  She was found to be dehydrated and abdominal x-ray showed distended loops of small bowel consistent with an obstruction.  A nasogastric tube was placed and she has had some improvement in her distention but still has not passed flatus today  Past Medical History:  Diagnosis Date  . High cholesterol   . SBO (small bowel obstruction) (HCC)     Past Surgical History:  Procedure Laterality Date  . APPENDECTOMY    . LAPAROSCOPIC APPENDECTOMY N/A 10/06/2019   Procedure: APPENDECTOMY LAPAROSCOPIC;  Surgeon: Jovita Kussmaul, MD;  Location: [facility];  Service: General;  Laterality: N/A;  . ROTATOR CUFF REPAIR    . TRIGGER FINGER RELEASE      Family History  Problem Relation Age of Onset  . Heart failure Mother   . Hypertension Mother   . Heart failure Father   . Hypertension Father    Social History:  reports that she has never smoked. She has never used smokeless tobacco. She reports that she does not drink alcohol or use drugs.  Allergies:  Allergies  Allergen Reactions  . Codeine     Medications Prior to Admission  Medication Sig Dispense Refill  . acetaminophen (TYLENOL) 500 MG tablet Take 500-1,000 mg by mouth every 6 (six) hours as needed for moderate pain.    Marland Kitchen atorvastatin (LIPITOR) 40 MG tablet Take 40 mg by mouth every Monday, Wednesday,  and Friday. Take on MWF    . Biotin 1 MG CAPS Take 1 capsule by mouth daily.    . cetirizine (ZYRTEC) 10 MG tablet Take 10 mg by mouth daily as needed for allergies.    . cholecalciferol (VITAMIN D3) 25 MCG (1000 UNIT) tablet Take 1,000 Units by mouth daily.    . feeding supplement, ENSURE ENLIVE, (ENSURE ENLIVE) LIQD Take 237 mLs by mouth 2 (two) times daily between meals. 237 mL 12  . ibuprofen (ADVIL,MOTRIN) 200 MG tablet Take 200 mg by mouth every 6 (six) hours as needed for moderate pain.     . Multiple Vitamin (MULTIVITAMIN WITH MINERALS) TABS tablet Take 1 tablet by mouth daily.      Results for orders placed or performed during the hospital encounter of 10/24/19 (from the past 48 hour(s))  Lipase, blood     Status: None   Collection Time: 10/24/19 10:58 PM  Result Value Ref Range   Lipase 42 11 - 51 U/L    Comment: Performed at [facility], [place] [address], [place], [place] [address]  Comprehensive metabolic panel     Status: Abnormal   Collection Time: 10/24/19 10:58 PM  Result Value Ref Range   Sodium 132 (L) 135 - 145 mmol/L   Potassium 3.4 (L) 3.5 - 5.1 mmol/L   Chloride 88 (L) 98 - 111 mmol/L   CO2 26 22 - 32 mmol/L   Glucose, Bld 106 (  H) 70 - 99 mg/dL    Comment: Glucose reference range applies only to samples taken after fasting for at least 8 hours.   BUN 55 (H) 8 - 23 mg/dL   Creatinine, Ser 1.52 (H) 0.44 - 1.00 mg/dL   Calcium 10.7 (H) 8.9 - 10.3 mg/dL   Total Protein 9.3 (H) 6.5 - 8.1 g/dL   Albumin 5.1 (H) 3.5 - 5.0 g/dL   AST 29 15 - 41 U/L   ALT 35 0 - 44 U/L   Alkaline Phosphatase 81 38 - 126 U/L   Total Bilirubin 2.1 (H) 0.3 - 1.2 mg/dL   GFR calc non Af Amer 34 (L) >60 mL/min   GFR calc Af Amer 40 (L) >60 mL/min   Anion gap 18 (H) 5 - 15    Comment: Performed at [facility], [place] [address] [place], [place] [address]  CBC with Differential/Platelet     Status: Abnormal   Collection Time: 10/24/19 11:41 PM  Result  Value Ref Range   WBC 10.1 4.0 - 10.5 K/uL   RBC 4.53 3.87 - 5.11 MIL/uL   Hemoglobin 13.5 12.0 - 15.0 g/dL   HCT 40.7 36.0 - 46.0 %   MCV 89.8 80.0 - 100.0 fL   MCH 29.8 26.0 - 34.0 pg   MCHC 33.2 30.0 - 36.0 g/dL   RDW 12.6 11.5 - 15.5 %   Platelets 489 (H) 150 - 400 K/uL   nRBC 0.0 0.0 - 0.2 %   Neutrophils Relative % 78 %   Neutro Abs 7.8 (H) 1.7 - 7.7 K/uL   Lymphocytes Relative 15 %   Lymphs Abs 1.6 0.7 - 4.0 K/uL   Monocytes Relative 7 %   Monocytes Absolute 0.7 0.1 - 1.0 K/uL   Eosinophils Relative 0 %   Eosinophils Absolute 0.0 0.0 - 0.5 K/uL   Basophils Relative 0 %   Basophils Absolute 0.0 0.0 - 0.1 K/uL   Immature Granulocytes 0 %   Abs Immature Granulocytes 0.04 0.00 - 0.07 K/uL    Comment: Performed at [facility], [place] [address] [place], [place] [address]  Respiratory Panel by RT PCR (Flu A&B, Covid) - Nasopharyngeal Swab     Status: None   Collection Time: 10/25/19  1:09 AM   Specimen: Nasopharyngeal Swab  Result Value Ref Range   SARS Coronavirus 2 by RT PCR NEGATIVE NEGATIVE    Comment: (NOTE) SARS-CoV-2 target nucleic acids are NOT DETECTED. The SARS-CoV-2 RNA is generally detectable in upper respiratoy specimens during the acute phase of infection. The lowest concentration of SARS-CoV-2 viral copies this assay can detect is 131 copies/mL. A negative result does not preclude SARS-Cov-2 infection and should not be used as the sole basis for treatment or other patient management decisions. A negative result may occur with  improper specimen collection/handling, submission of specimen other than nasopharyngeal swab, presence of viral mutation(s) within the areas targeted by this assay, and inadequate number of viral copies (<131 copies/mL). A negative result must be combined with clinical observations, patient history, and epidemiological information. The expected result is Negative. Fact Sheet for Patients:   PinkCheek.be Fact Sheet for Healthcare Providers:  GravelBags.it This test is not yet ap proved or cleared by the [place] FDA and  has been authorized for detection and/or diagnosis of SARS-CoV-2 by FDA under an Emergency Use Authorization (EUA). This EUA will remain  in effect (meaning this test can be used) for the duration of the COVID-19  declaration under Section 564(b)(1) of the Act, 21 U.S.C. section 360bbb-3(b)(1), unless the authorization is terminated or revoked sooner.    Influenza A by PCR NEGATIVE NEGATIVE   Influenza B by PCR NEGATIVE NEGATIVE    Comment: (NOTE) The Xpert Xpress SARS-CoV-2/FLU/RSV assay is intended as an aid in  the diagnosis of influenza from Nasopharyngeal swab specimens and  should not be used as a sole basis for treatment. Nasal washings and  aspirates are unacceptable for Xpert Xpress SARS-CoV-2/FLU/RSV  testing. Fact Sheet for Patients: PinkCheek.be Fact Sheet for Healthcare Providers: GravelBags.it This test is not yet approved or cleared by the [place] FDA and  has been authorized for detection and/or diagnosis of SARS-CoV-2 by  FDA under an Emergency Use Authorization (EUA). This EUA will remain  in effect (meaning this test can be used) for the duration of the  Covid-19 declaration under Section 564(b)(1) of the Act, 21  U.S.C. section 360bbb-3(b)(1), unless the authorization is  terminated or revoked. Performed at [facility], [place] [address], [place], [place] [id number]   Basic metabolic panel     Status: Abnormal   Collection Time: 10/25/19  4:22 AM  Result Value Ref Range   Sodium 132 (L) 135 - 145 mmol/L   Potassium 3.5 3.5 - 5.1 mmol/L   Chloride 94 (L) 98 - 111 mmol/L   CO2 24 22 - 32 mmol/L   Glucose, Bld 88 70 - 99 mg/dL    Comment: Glucose reference range applies only to samples  taken after fasting for at least 8 hours.   BUN 45 (H) 8 - 23 mg/dL   Creatinine, Ser 1.00 0.44 - 1.00 mg/dL   Calcium 9.3 8.9 - 10.3 mg/dL   GFR calc non Af Amer 57 (L) >60 mL/min   GFR calc Af Amer >60 >60 mL/min   Anion gap 14 5 - 15    Comment: Performed at [facility], [place] [address], [place], [place] [address]  CBC     Status: Abnormal   Collection Time: 10/25/19  4:22 AM  Result Value Ref Range   WBC 9.1 4.0 - 10.5 K/uL   RBC 3.97 3.87 - 5.11 MIL/uL   Hemoglobin 11.9 (L) 12.0 - 15.0 g/dL   HCT 36.3 36.0 - 46.0 %   MCV 91.4 80.0 - 100.0 fL   MCH 30.0 26.0 - 34.0 pg   MCHC 32.8 30.0 - 36.0 g/dL   RDW 12.5 11.5 - 15.5 %   Platelets 454 (H) 150 - 400 K/uL   nRBC 0.0 0.0 - 0.2 %    Comment: Performed at [facility], [place] [address], [place], [place] [address]   DG Abdomen 1 View  Result Date: 10/25/2019 CLINICAL DATA:  Enteric catheter placement EXAM: ABDOMEN - 1 VIEW COMPARISON:  10/24/2019 FINDINGS: Frontal view of the lower chest and upper abdomen demonstrates enteric catheter coiled over gastric fundus. Persistent small bowel obstruction. IMPRESSION: 1. Enteric catheter over gastric fundus. Electronically Signed   By: Randa Ngo M.D.   On: 10/25/2019 01:25   DG ABD ACUTE 2+V W 1V CHEST  Result Date: 10/24/2019 CLINICAL DATA:  No bowel movement for 4 days, history of bowel obstruction, vomiting EXAM: DG ABDOMEN ACUTE W/ 1V CHEST COMPARISON:  10/13/2019 FINDINGS: Supine and upright frontal views of the abdomen as well as an upright frontal view of the chest are obtained. Cardiac silhouette is unremarkable. No airspace disease, effusion, or pneumothorax. Since the prior exam, there  is been marked increasing caliber of distended gas-filled small bowel, now measuring up to 5.4 cm in diameter. There is a paucity of distal bowel gas, consistent with high-grade small bowel obstruction. Numerous gas fluid levels are identified. No free gas in the  greater peritoneal sac. IMPRESSION: 1. High-grade small-bowel obstruction, with increased caliber of the small bowel since prior study. Electronically Signed   By: Randa Ngo M.D.   On: 10/24/2019 23:34    Review of Systems  Constitutional: Negative for chills and fever.  Respiratory: Negative for shortness of breath.   Cardiovascular: Negative for chest pain.  Gastrointestinal: Positive for abdominal distention and abdominal pain.  All other systems reviewed and are negative.   Blood pressure (!) 138/91, pulse 88, temperature 98.6 F (37 C), temperature source Oral, resp. rate 14, height 5\' 1"  (1.549 m), weight 54.6 kg, SpO2 97 %. Physical Exam  Constitutional: She is oriented to person, place, and time. She appears well-developed and well-nourished. No distress.  HENT:  Head: Normocephalic and atraumatic.  Right Ear: External ear normal.  Left Ear: External ear normal.  Nose: Nose normal.  Eyes: Pupils are equal, round, and reactive to light. No scleral icterus.  Cardiovascular: Normal rate, regular rhythm and normal heart sounds.  Respiratory: Effort normal and breath sounds normal. No respiratory distress.  GI: She exhibits distension. There is no guarding.  Minimally tender but distended abdomen  Musculoskeletal:        General: No edema. Normal range of motion.     Cervical back: Normal range of motion and neck supple.  Neurological: She is alert and oriented to person, place, and time.  Skin: Skin is warm and dry.  Psychiatric: Her behavior is normal. Judgment normal.     Assessment/Plan Small bowel obstruction postop status post appendectomy   When she was readmitted on 4/23, she underwent a CT scan which showed no evidence of abscess and only findings consistent with a obstruction versus ileus.  Her white blood count is normal so we will hold on the CT scan and instead get a small bowel protocol x-ray to determine whether this is a true obstruction that may require  surgery versus an ileus. She did present also with dehydration and acute kidney injury but with IV fluids, her creatinine and GFR have returned to normal. We will continue nasogastric suctioning and bowel rest pending the protocol films.   Coralie Keens, MD 10/25/2019, 6:30 AM

## 2019-10-25 NOTE — Progress Notes (Signed)
MD notified of elevated VS via pager amnion

## 2019-10-25 NOTE — Plan of Care (Signed)
  Problem: Education: Goal: Knowledge of General Education information will improve Description: Including pain rating scale, medication(s)/side effects and non-pharmacologic comfort measures Outcome: Progressing   Problem: Health Behavior/Discharge Planning: Goal: Ability to manage health-related needs will improve Outcome: Progressing   Problem: Clinical Measurements: Goal: Will remain free from infection Outcome: Progressing   Problem: Nutrition: Goal: Adequate nutrition will be maintained Outcome: Progressing   Problem: Coping: Goal: Level of anxiety will decrease Outcome: Progressing   Problem: Elimination: Goal: Will not experience complications related to bowel motility Outcome: Progressing   Problem: Pain Managment: Goal: General experience of comfort will improve Outcome: Progressing   Problem: Safety: Goal: Ability to remain free from injury will improve Outcome: Progressing

## 2019-10-26 ENCOUNTER — Encounter (HOSPITAL_COMMUNITY): Payer: Self-pay | Admitting: Surgery

## 2019-10-26 ENCOUNTER — Telehealth: Payer: Self-pay | Admitting: Nurse Practitioner

## 2019-10-26 ENCOUNTER — Inpatient Hospital Stay (HOSPITAL_COMMUNITY): Payer: Medicare Other

## 2019-10-26 ENCOUNTER — Inpatient Hospital Stay: Payer: Medicare Other | Admitting: Nurse Practitioner

## 2019-10-26 DIAGNOSIS — D373 Neoplasm of uncertain behavior of appendix: Secondary | ICD-10-CM

## 2019-10-26 LAB — CBC
HCT: 35.9 % — ABNORMAL LOW (ref 36.0–46.0)
Hemoglobin: 11.4 g/dL — ABNORMAL LOW (ref 12.0–15.0)
MCH: 29.7 pg (ref 26.0–34.0)
MCHC: 31.8 g/dL (ref 30.0–36.0)
MCV: 93.5 fL (ref 80.0–100.0)
Platelets: 467 10*3/uL — ABNORMAL HIGH (ref 150–400)
RBC: 3.84 MIL/uL — ABNORMAL LOW (ref 3.87–5.11)
RDW: 12.9 % (ref 11.5–15.5)
WBC: 8.9 10*3/uL (ref 4.0–10.5)
nRBC: 0 % (ref 0.0–0.2)

## 2019-10-26 LAB — BASIC METABOLIC PANEL
Anion gap: 14 (ref 5–15)
BUN: 39 mg/dL — ABNORMAL HIGH (ref 8–23)
CO2: 22 mmol/L (ref 22–32)
Calcium: 9 mg/dL (ref 8.9–10.3)
Chloride: 105 mmol/L (ref 98–111)
Creatinine, Ser: 0.99 mg/dL (ref 0.44–1.00)
GFR calc Af Amer: >60 mL/min (ref >60)
GFR calc non Af Amer: 57 mL/min — ABNORMAL LOW (ref >60)
Glucose, Bld: 87 mg/dL (ref 70–99)
Potassium: 3.5 mmol/L (ref 3.5–5.1)
Sodium: 141 mmol/L (ref 135–145)

## 2019-10-26 LAB — PREALBUMIN: Prealbumin: 20.7 mg/dL (ref 18–38)

## 2019-10-26 LAB — MAGNESIUM: Magnesium: 2.4 mg/dL (ref 1.7–2.4)

## 2019-10-26 MED ORDER — SODIUM CHLORIDE (PF) 0.9 % IJ SOLN
INTRAMUSCULAR | Status: AC
  Filled 2019-10-26: qty 50

## 2019-10-26 MED ORDER — IOHEXOL 300 MG/ML  SOLN
75.0000 mL | Freq: Once | INTRAMUSCULAR | Status: AC | PRN
  Administered 2019-10-26: 75 mL via INTRAVENOUS

## 2019-10-26 MED ORDER — IOHEXOL 9 MG/ML PO SOLN
500.0000 mL | ORAL | Status: AC
  Administered 2019-10-26 (×2): 500 mL via ORAL

## 2019-10-26 MED ORDER — IOHEXOL 9 MG/ML PO SOLN
ORAL | Status: AC
  Filled 2019-10-26: qty 1000

## 2019-10-26 MED ORDER — ENOXAPARIN SODIUM 40 MG/0.4ML ~~LOC~~ SOLN
40.0000 mg | SUBCUTANEOUS | Status: DC
  Administered 2019-10-27 – 2019-11-04 (×9): 40 mg via SUBCUTANEOUS
  Filled 2019-10-26 (×9): qty 0.4

## 2019-10-26 NOTE — Plan of Care (Signed)

## 2019-10-26 NOTE — Progress Notes (Signed)
[facility] Progress Note     Subjective: CC-  Feeling much better. Denies any abdominal pain. Mild nausea over night, no emesis. NG tube with 3.4L out. Passing flatus and had 3 loose BMs over night. Delayed film for SBO protocol showed persistent small bowel obstruction with the majority of the oral contrast remaining within the small bowel.  Objective: Vital signs in last 24 hours: Temp:  [98 F (36.7 C)-98.9 F (37.2 C)] 98.2 F (36.8 C) (05/10 0535) Pulse Rate:  [90-101] 97 (05/10 0535) Resp:  [14-16] 14 (05/10 0535) BP: (135-159)/(88-97) 138/88 (05/10 0535) SpO2:  [97 %-98 %] 98 % (05/10 0535) Last BM Date: 10/22/19  Intake/Output from previous day: 05/09 0701 - 05/10 0700 In: 2966.9 [P.O.:420; I.V.:2456.9; NG/GT:90] Out: 4100 [Urine:700; Emesis/NG output:3400] Intake/Output this shift: Total I/O In: 409.4 [I.V.:409.4] Out: -   PE: Gen:  Alert, NAD, pleasant Pulm:  rate and effort normal Abd: Soft, NT/ND, lap incisions C/D/I  Lab Results:  Recent Labs    10/25/19 0422 10/26/19 0731  WBC 9.1 8.9  HGB 11.9* 11.4*  HCT 36.3 35.9*  PLT 454* 467*   BMET Recent Labs    10/25/19 0422 10/26/19 0731  NA 132* 141  K 3.5 3.5  CL 94* 105  CO2 24 22  GLUCOSE 88 87  BUN 45* 39*  CREATININE 1.00 0.99  CALCIUM 9.3 9.0   PT/INR No results for input(s): LABPROT, INR in the last 72 hours. CMP     Component Value Date/Time   NA 141 10/26/2019 0731   K 3.5 10/26/2019 0731   CL 105 10/26/2019 0731   CO2 22 10/26/2019 0731   GLUCOSE 87 10/26/2019 0731   BUN 39 (H) 10/26/2019 0731   CREATININE 0.99 10/26/2019 0731   CALCIUM 9.0 10/26/2019 0731   PROT 9.3 (H) 10/24/2019 2258   ALBUMIN 5.1 (H) 10/24/2019 2258   AST 29 10/24/2019 2258   ALT 35 10/24/2019 2258   ALKPHOS 81 10/24/2019 2258   BILITOT 2.1 (H) 10/24/2019 2258   GFRNONAA 57 (L) 10/26/2019 0731   GFRAA >60 10/26/2019 0731   Lipase     Component Value Date/Time   LIPASE 42 10/24/2019  2258       Studies/Results: DG Abdomen 1 View  Result Date: 10/25/2019 CLINICAL DATA:  Enteric catheter placement EXAM: ABDOMEN - 1 VIEW COMPARISON:  10/24/2019 FINDINGS: Frontal view of the lower chest and upper abdomen demonstrates enteric catheter coiled over gastric fundus. Persistent small bowel obstruction. IMPRESSION: 1. Enteric catheter over gastric fundus. Electronically Signed   By: Randa Ngo M.D.   On: 10/25/2019 01:25   DG ABD ACUTE 2+V W 1V CHEST  Result Date: 10/24/2019 CLINICAL DATA:  No bowel movement for 4 days, history of bowel obstruction, vomiting EXAM: DG ABDOMEN ACUTE W/ 1V CHEST COMPARISON:  10/13/2019 FINDINGS: Supine and upright frontal views of the abdomen as well as an upright frontal view of the chest are obtained. Cardiac silhouette is unremarkable. No airspace disease, effusion, or pneumothorax. Since the prior exam, there is been marked increasing caliber of distended gas-filled small bowel, now measuring up to 5.4 cm in diameter. There is a paucity of distal bowel gas, consistent with high-grade small bowel obstruction. Numerous gas fluid levels are identified. No free gas in the greater peritoneal sac. IMPRESSION: 1. High-grade small-bowel obstruction, with increased caliber of the small bowel since prior study. Electronically Signed   By: Randa Ngo M.D.   On: 10/24/2019 23:34   DG  Abd Portable 1V-Small Bowel Obstruction Protocol-initial, 8 hr delay  Result Date: 10/25/2019 CLINICAL DATA:  8 hour are bowel protocol EXAM: PORTABLE ABDOMEN - 1 VIEW COMPARISON:  Oct 25, 2019 FINDINGS: Again noted are significantly dilated loops of small bowel measuring up to approximately 5 cm. The enteric tube projects over the gastric body. The majority of the oral contrast remains within the stomach and small bowel. There is no definite oral contrast visualized within the colon. No pneumatosis. No free air. IMPRESSION: 1. Persistent small bowel obstruction with the majority of  the oral contrast remaining within the small bowel. No oral contrast was visualized within the colon. 2. Enteric tube projects over the gastric body. Electronically Signed   By: Constance Holster M.D.   On: 10/25/2019 17:07    Anti-infectives: Anti-infectives (From admission, onward)   None       Assessment/Plan HLD Blood products refusal AKI - resolved, Cr 0.99 Anemia - Hgb 11.4, stable  Acute appendicitis S/p laparoscopic appendectomy 4/20 Dr. Marlou Starks - POD#20 - path: Low-grade appendiceal mucinous neoplasm (LAMN) >> oncology to see - readmitted 5/9 with SBO  ID - none FEN - IVF, NPO/NGT to LIWS VTE - SCDs, lovenox Foley - none Follow up - TBD  Plan - CT scan today with oral contrast per NG tube.   LOS: 1 day    Wellington Hampshire, [facility] 10/26/2019, 10:57 AM Please see Amion for pager number during day hours 7:00am-4:30pm

## 2019-10-26 NOTE — Consult Note (Addendum)
[place]  [phone] Fax:(336) [phone]   MEDICAL ONCOLOGY - INITIAL CONSULTATION  Referral MD: Dr. 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 Dabdoub is a 74 year old female with a past medical history significant for hyperlipidemia and small bowel obstruction.  The patient was referred to medical oncology as an outpatient and was scheduled to be seen on 11/05/2019.  However, she was readmitted this past weekend secondary to abdominal distention.  The patient underwent a laparoscopic appendectomy for acute appendicitis on 10/06/2019.  She was discharged home on 10/08/2019 was readmitted on 4/23/thousand 21 with SBO versus ileus.  She improved with conservative management was discharged home on 10/19/2019.  She was initially doing well but over the past several days prior to this admission, she was not having a bowel movement.  She took a suppository the day prior to admission and had some diarrhea, but also developed worsening abdominal distention and presented to the emergency room.  She was found to be dehydrated and abdominal x-ray showed distended loops of small bowel consistent with obstruction.  NG tube was placed.  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  Surgical resection margin is negative for tumor.  Pathologic stage is pTis, pNX.  The patient currently has an NG tube in place.  Denies fevers and chills.  Abdominal pain, nausea, vomiting are better.  Bowels are now moving.  She denies chest pain or shortness of breath.  Denies headaches and dizziness.  Prior to her appendectomy, she reports that her appetite was good and she did not have any weight loss.  Patient was referred to medical oncology to make recommendations regarding her low-grade appendiceal carcinoma.   Past Medical History:   Diagnosis Date  . High cholesterol   . SBO (small bowel obstruction) (HCC)   :  Past Surgical History:  Procedure Laterality Date  . APPENDECTOMY    . LAPAROSCOPIC APPENDECTOMY N/A 10/06/2019   Procedure: APPENDECTOMY LAPAROSCOPIC;  Surgeon: Jovita Kussmaul, MD;  Location: [facility];  Service: General;  Laterality: N/A;  . ROTATOR CUFF REPAIR    . TRIGGER FINGER RELEASE    :  Current Facility-Administered Medications  Medication Dose Route Frequency Provider Last Rate Last Admin  . 0.9 % NaCl with KCl 20 mEq/ L  infusion   Intravenous Continuous Coralie Keens, MD 100 mL/hr at 10/26/19 0808 New Bag at 10/26/19 [id number]  . enoxaparin (LOVENOX) injection 30 mg  30 mg Subcutaneous Q24H Coralie Keens, MD   30 mg at 10/25/19 1131  . fentaNYL (SUBLIMAZE) injection 12.5 mcg  12.5 mcg Intravenous Q2H PRN Coralie Keens, MD      . metoprolol tartrate (LOPRESSOR) injection 2.5 mg  2.5 mg Intravenous Q6H PRN Romana Juniper A, MD      . ondansetron (ZOFRAN-ODT) disintegrating tablet 4 mg  4 mg Oral Q6H PRN Coralie Keens, MD       Or  . ondansetron [facility]) injection 4 mg  4 mg Intravenous Q6H PRN Coralie Keens, MD   4 mg at 10/25/19 1306     Allergies  Allergen Reactions  . Codeine Nausea And Vomiting  :  Family History  Problem Relation Age of Onset  . Heart failure Mother   . Hypertension Mother   . Heart failure Father   . Hypertension Father   :  Social History   Socioeconomic History  .  Marital status: Married    Spouse name: Not on file  . Number of children: Not on file  . Years of education: Not on file  . Highest education level: Not on file  Occupational History  . Not on file  Tobacco Use  . Smoking status: Never Smoker  . Smokeless tobacco: Never Used  Substance and Sexual Activity  . Alcohol use: No  . Drug use: No  . Sexual activity: Not on file  Other Topics Concern  . Not on file  Social History Narrative  . Not on file   Social  Determinants of Health   Financial Resource Strain:   . Difficulty of Paying Living Expenses:   Food Insecurity:   . Worried About Charity fundraiser in the Last Year:   . Arboriculturist in the Last Year:   Transportation Needs:   . Film/video editor (Medical):   Marland Kitchen Lack of Transportation (Non-Medical):   Physical Activity:   . Days of Exercise per Week:   . Minutes of Exercise per Session:   Stress:   . Feeling of Stress :   Social Connections:   . Frequency of Communication with Friends and Family:   . Frequency of Social Gatherings with Friends and Family:   . Attends Religious Services:   . Active Member of Clubs or Organizations:   . Attends Archivist Meetings:   Marland Kitchen Marital Status:   Intimate Partner Violence:   . Fear of Current or Ex-Partner:   . Emotionally Abused:   Marland Kitchen Physically Abused:   . Sexually Abused:   :  Review of Systems: A comprehensive 14 point review of systems was negative except as noted in the HPI.  Exam: Patient Vitals for the past 24 hrs:  BP Temp Temp src Pulse Resp SpO2  10/26/19 0535 138/88 98.2 F (36.8 C) Oral 97 14 98 %  10/26/19 0213 (!) 144/91 98.9 F (37.2 C) Oral 95 16 98 %  10/25/19 2201 135/88 98 F (36.7 C) Oral 98 16 97 %  10/25/19 1719 (!) 159/97 98.5 F (36.9 C) -- (!) 101 -- 98 %  10/25/19 1333 (!) 154/97 98.1 F (36.7 C) Oral 90 16 98 %    General:  well-nourished in no acute distress.   Eyes:  no scleral icterus.   ENT:  There were no oropharyngeal lesions, NG tube in place Neck was without thyromegaly.   Lymphatics:  Negative cervical, supraclavicular or axillary adenopathy.   Respiratory: lungs were clear bilaterally without wheezing or crackles.   Cardiovascular:  Regular rate and rhythm, S1/S2, without murmur, rub or gallop.  There was no pedal edema.   GI:  abdomen was soft, flat, nontender, nondistended, without organomegaly.   Musculoskeletal:  no spinal tenderness of palpation of vertebral  spine.   Skin exam was without echymosis, petichae.   Neuro exam was nonfocal. Patient was alert and oriented.  Attention was good.   Language was appropriate.  Mood was normal without depression.  Speech was not pressured.  Thought content was not tangential.     Lab Results  Component Value Date   WBC 8.9 10/26/2019   HGB 11.4 (L) 10/26/2019   HCT 35.9 (L) 10/26/2019   PLT 467 (H) 10/26/2019   GLUCOSE 87 10/26/2019   TRIG 93 10/13/2019   ALT 35 10/24/2019   AST 29 10/24/2019   NA 141 10/26/2019   K 3.5 10/26/2019   CL 105 10/26/2019   CREATININE 0.99  10/26/2019   BUN 39 (H) 10/26/2019   CO2 22 10/26/2019    CT Abdomen Pelvis Wo Contrast  Result Date: 10/09/2019 CLINICAL DATA:  75 year old female with abdominal distension. Evaluate for small bowel obstruction. Status post recent appendectomy. EXAM: CT ABDOMEN AND PELVIS WITHOUT CONTRAST TECHNIQUE: Multidetector CT imaging of the abdomen and pelvis was performed following the standard protocol without IV contrast. COMPARISON:  Abdominal radiograph dated 10/09/2019 and CT abdomen pelvis dated 10/05/2019. FINDINGS: Evaluation of this exam is limited in the absence of intravenous contrast. Lower chest: The visualized lung bases are clear. Coronary vascular calcifications noted. No intra-abdominal free air. Small free fluid in the pelvis. Hepatobiliary: Mild fatty liver. No intrahepatic biliary ductal dilatation. Probable small amount of gallbladder sludge versus vicarious excretion of recently administered contrast. No pericholecystic fluid or evidence of acute cholecystitis by CT. Pancreas: Unremarkable. No pancreatic ductal dilatation or surrounding inflammatory changes. Spleen: The spleen is small. Adrenals/Urinary Tract: The adrenal glands unremarkable. There is no hydronephrosis or nephrolithiasis on either side. The visualized ureters appear unremarkable. Small amount of air within the urinary bladder, likely related to recent  instrumentation or catheterization. Clinical correlation is recommended. Stomach/Bowel: The stomach is moderately distended with fluid content. Multiple dilated fluid-filled loops of small bowel in the mid to upper abdomen measure up to 3.5 cm in caliber. The distal small bowel and terminal ileum are collapsed. A transition is noted in the pelvis (51/2 and 70/5) most consistent with a small-bowel obstruction. The colon is unremarkable. Appendectomy clips. Vascular/Lymphatic: Minimal aortoiliac atherosclerotic disease. The IVC is unremarkable. No portal venous gas. There is no adenopathy. Reproductive: The uterus and ovaries are grossly unremarkable. Other: Induration of the periumbilical fat related to recent port placement. No fluid collection. Musculoskeletal: Degenerative changes and disc desiccation at L4-5. No acute osseous pathology. IMPRESSION: Small-bowel obstruction with transition in the lower abdomen/pelvis. Clinical correlation and follow-up recommended. Electronically Signed   By: Anner Crete M.D.   On: 10/09/2019 21:23   DG Abdomen 1 View  Result Date: 10/25/2019 CLINICAL DATA:  Enteric catheter placement EXAM: ABDOMEN - 1 VIEW COMPARISON:  10/24/2019 FINDINGS: Frontal view of the lower chest and upper abdomen demonstrates enteric catheter coiled over gastric fundus. Persistent small bowel obstruction. IMPRESSION: 1. Enteric catheter over gastric fundus. Electronically Signed   By: Randa Ngo M.D.   On: 10/25/2019 01:25   CT ABDOMEN PELVIS W CONTRAST  Result Date: 10/05/2019 CLINICAL DATA:  Acute abdominal pain. Neutropenia. Bloating. EXAM: CT ABDOMEN AND PELVIS WITH CONTRAST TECHNIQUE: Multidetector CT imaging of the abdomen and pelvis was performed using the standard protocol following bolus administration of intravenous contrast. CONTRAST:  188mL OMNIPAQUE IOHEXOL 300 MG/ML  SOLN COMPARISON:  Radiograph earlier this day FINDINGS: Lower chest: Dependent atelectasis in both lower  lobes. No pleural fluid. Hepatobiliary: No focal liver abnormality is seen. No gallstones, gallbladder wall thickening, or biliary dilatation. Pancreas: No ductal dilatation or inflammation. Spleen: Normal in size without focal abnormality. Adrenals/Urinary Tract: Normal adrenal glands. No hydronephrosis or perinephric edema. Homogeneous renal enhancement with symmetric excretion on delayed phase imaging. Urinary bladder is partially distended without wall thickening. Stomach/Bowel: Findings suspicious for acute appendicitis as described below. The appendix is dilated and fluid-filled. Fluid/ingested material in the stomach. There is no gastric wall thickening. Normal positioning of the ligament of Treitz. Scattered fluid-filled small bowel without evidence of obstruction or small bowel inflammation. Mild colonic tortuosity. Small volume of stool throughout the colon intermixed with colonic nondistention. Appendix: Location: Right upper quadrant  coursing inferior to the liver, series 3, image 30, series 5, image 59. Diameter: 9 mm. Appendicolith: No. Mucosal hyper-enhancement: Yes. Extraluminal gas: No. Periappendiceal collection: No. Faint periappendiceal fat stranding about the tip. Vascular/Lymphatic: Normal caliber abdominal aorta. Mild distal aortic atherosclerosis. The portal vein is patent. Mesenteric vessels are patent. No bulky abdominopelvic adenopathy. Reproductive: Postmenopausal appearance of the uterus. Small amount of free fluid in the pelvis. No obvious adnexal mass. Other: No intra-abdominal abscess or free air. Small amount of free fluid in the pelvis which is nonspecific, and may be reactive. Tiny fat containing umbilical hernia. Musculoskeletal: There are no acute or suspicious osseous abnormalities. Degenerative disc disease in the lower lumbar spine. Multilevel facet hypertrophy. IMPRESSION: 1. Findings consistent with uncomplicated acute appendicitis. The appendix courses into the right upper  quadrant and is located just inferior to the liver. No abscess or perforation. 2. Small amount of free fluid in the pelvis is nonspecific, and may be reactive. Aortic Atherosclerosis (ICD10-I70.0). Electronically Signed   By: Keith Rake M.D.   On: 10/05/2019 23:19   DG Chest Port 1 View  Result Date: 10/11/2019 CLINICAL DATA:  Respiratory distress. EXAM: PORTABLE CHEST 1 VIEW COMPARISON:  06/27/2009 chest radiograph FINDINGS: The cardiomediastinal silhouette is unremarkable. There is no evidence of focal airspace disease, pulmonary edema, suspicious pulmonary nodule/mass, pleural effusion, or pneumothorax. No acute bony abnormalities are identified. An NG tube is present within the stomach. IMPRESSION: No evidence of acute cardiopulmonary disease. NG tube within the stomach. Electronically Signed   By: Margarette Canada M.D.   On: 10/11/2019 20:36   DG Abd 2 Views  Result Date: 10/12/2019 CLINICAL DATA:  Abdominal distension and nausea EXAM: ABDOMEN - 2 VIEW COMPARISON:  October 11, 2019 FINDINGS: Nasogastric tube tip and side port in stomach. There remains bowel dilatation with multiple air-fluid levels. A small amount of air and contrast are noted in the colon. No free air. Lung bases are clear. No abnormal calcifications. IMPRESSION: Findings concerning for persistent degree of small bowel obstruction. No free air. Nasogastric tube tip and side port in stomach. Electronically Signed   By: Lowella Grip III M.D.   On: 10/12/2019 10:19   DG Abd 2 Views  Result Date: 10/11/2019 CLINICAL DATA:  Evaluate contrast transit, suspected bowel obstruction EXAM: ABDOMEN - 2 VIEW COMPARISON:  10/10/2019 FINDINGS: Interval transit of administered enteric contrast to the proximal transverse colon with scattered gas present to the transverse colon as well. The small bowel remains diffusely fluid-filled although not overtly distended. Esophagogastric tube with tip and side port below the diaphragm. No free air.  IMPRESSION: 1. Interval transit of administered enteric contrast to the proximal transverse colon with scattered gas present to the transverse colon as well. The small bowel remains diffusely fluid-filled although not overtly distended. 2.  Esophagogastric tube with tip and side port below the diaphragm. Electronically Signed   By: Eddie Candle M.D.   On: 10/11/2019 12:17   DG Abd 2 Views  Result Date: 10/10/2019 CLINICAL DATA:  74 year old female with history of small-bowel obstruction. EXAM: ABDOMEN - 2 VIEW COMPARISON:  Abdominal radiograph 10/09/2019. FINDINGS: Nasogastric tube with tip in the body of the stomach and side port distal to the gastroesophageal junction. Multiple air-fluid levels are noted on the upright projection. Mildly dilated loop of gas-filled small bowel in the left upper quadrant measuring 4 cm in diameter. Small amount of gas and stool noted throughout the colon including the distal rectum. No pneumoperitoneum. Surgical clips project over  the right-side of the abdomen. IMPRESSION: 1. Abnormal bowel gas pattern favored to reflect partial small bowel obstruction, as above. 2. Support apparatus, as above. 3. No pneumoperitoneum. Electronically Signed   By: Vinnie Langton M.D.   On: 10/10/2019 08:59   DG ABD ACUTE 2+V W 1V CHEST  Result Date: 10/24/2019 CLINICAL DATA:  No bowel movement for 4 days, history of bowel obstruction, vomiting EXAM: DG ABDOMEN ACUTE W/ 1V CHEST COMPARISON:  10/13/2019 FINDINGS: Supine and upright frontal views of the abdomen as well as an upright frontal view of the chest are obtained. Cardiac silhouette is unremarkable. No airspace disease, effusion, or pneumothorax. Since the prior exam, there is been marked increasing caliber of distended gas-filled small bowel, now measuring up to 5.4 cm in diameter. There is a paucity of distal bowel gas, consistent with high-grade small bowel obstruction. Numerous gas fluid levels are identified. No free gas in the  greater peritoneal sac. IMPRESSION: 1. High-grade small-bowel obstruction, with increased caliber of the small bowel since prior study. Electronically Signed   By: Randa Ngo M.D.   On: 10/24/2019 23:34   DG ABD ACUTE 2+V W 1V CHEST  Result Date: 10/09/2019 CLINICAL DATA:  74 year old female with vomiting and bloating. EXAM: DG ABDOMEN ACUTE W/ 1V CHEST COMPARISON:  Abdominal radiograph dated 10/05/2019 and CT dated 10/05/2019 FINDINGS: The lungs are clear. There is no pleural effusion or pneumothorax. The cardiac silhouette is within normal limits. Mild atherosclerotic calcification of the aortic arch. Mildly dilated loop of small bowel in the left upper abdomen measures up to 4 cm in diameter and may represent postoperative ileus versus obstruction. Clinical correlation is recommended. No free air or radiopaque calculi identified. Appendectomy clips. The osseous structures and soft tissues are unremarkable. A 16 mm rounded calcific density in the left upper quadrant likely in the left breast. IMPRESSION: 1. No acute cardiopulmonary process. 2. Mildly dilated loop of small bowel in the left upper abdomen may represent postoperative ileus versus obstruction. Electronically Signed   By: Anner Crete M.D.   On: 10/09/2019 20:02   DG Abd Portable 1V-Small Bowel Obstruction Protocol-initial, 8 hr delay  Result Date: 10/25/2019 CLINICAL DATA:  8 hour are bowel protocol EXAM: PORTABLE ABDOMEN - 1 VIEW COMPARISON:  Oct 25, 2019 FINDINGS: Again noted are significantly dilated loops of small bowel measuring up to approximately 5 cm. The enteric tube projects over the gastric body. The majority of the oral contrast remains within the stomach and small bowel. There is no definite oral contrast visualized within the colon. No pneumatosis. No free air. IMPRESSION: 1. Persistent small bowel obstruction with the majority of the oral contrast remaining within the small bowel. No oral contrast was visualized within the  colon. 2. Enteric tube projects over the gastric body. Electronically Signed   By: Constance Holster M.D.   On: 10/25/2019 17:07   DG Abd Portable 1V  Result Date: 10/13/2019 CLINICAL DATA:  Nasogastric tube placement EXAM: PORTABLE ABDOMEN - 1 VIEW COMPARISON:  Yesterday FINDINGS: The nasogastric tube loops through the stomach with tip at the fundus. Dilated small bowel in the central abdomen measuring up to 4 cm in diameter. Oral contrast is again seen in the ascending colon. IMPRESSION: 1. Lengthened orogastric tube which loops through the stomach with tip at the gastric fundus. 2. Continued small bowel dilatation. Electronically Signed   By: Monte Fantasia M.D.   On: 10/13/2019 08:23   DG Abd Portable 1V  Result Date: 10/12/2019 CLINICAL DATA:  Nasogastric placement EXAM: PORTABLE ABDOMEN - 1 VIEW COMPARISON:  10/12/2019 FINDINGS: Orogastric or nasogastric tube enters the stomach, loops in the fundus and has its tip at the body antral junction. IMPRESSION: Orogastric or nasogastric tube tip at the body antral junction of the stomach. Electronically Signed   By: Nelson Chimes M.D.   On: 10/12/2019 16:13   DG Abd Portable 1V-Small Bowel Obstruction Protocol-initial, 8 hr delay  Result Date: 10/10/2019 CLINICAL DATA:  8 hour delay for small-bowel obstruction. EXAM: PORTABLE ABDOMEN - 1 VIEW COMPARISON:  October 10, 2019 FINDINGS: A nasogastric tube is seen with its distal tip overlying the body of the stomach. Multiple dilated, mildly opacified small bowel loops are seen throughout the abdomen and pelvis. No contrast is seen within the large bowel. Radiopaque surgical clips are seen within the mid to lower right abdomen. IMPRESSION: Findings consistent with small-bowel obstruction. Electronically Signed   By: Virgina Norfolk M.D.   On: 10/10/2019 19:53   DG Abd Portable 1V-Small Bowel Protocol-Position Verification  Result Date: 10/10/2019 CLINICAL DATA:  NG tube placement EXAM: PORTABLE ABDOMEN -  1 VIEW COMPARISON:  October 10, 2019 FINDINGS: The distal tip of the NG tube is in the right mid abdomen, likely in the distal stomach. IMPRESSION: Distal tip of the NG tube appears to be in the distal stomach. Electronically Signed   By: Dorise Bullion III M.D   On: 10/10/2019 13:54   DG Abd Portable 1 View  Result Date: 10/09/2019 CLINICAL DATA:  Nasogastric tube placement. EXAM: PORTABLE ABDOMEN - 1 VIEW COMPARISON:  October 09, 2019 (7:50 p.m.) FINDINGS: Since the prior study there has been interval placement of a nasogastric tube. Its distal tip is seen overlying the expected region of the body of the stomach. Persistently dilated small bowel loops are seen overlying the mid left abdomen. These are unchanged in caliber when compared to the prior exam. No radio-opaque calculi or other significant radiographic abnormality are seen. Radiopaque surgical clips are again seen overlying the mid right abdomen. IMPRESSION: 1. Nasogastric tube positioning, as described above. 2. Persistently dilated small bowel loops, which may reflect a small bowel obstruction versus ileus. Electronically Signed   By: Virgina Norfolk M.D.   On: 10/09/2019 23:46   [place] EKG SITE RITE  Result Date: 10/12/2019 If Site Rite image not attached, placement could not be confirmed due to current cardiac rhythm.    CT Abdomen Pelvis Wo Contrast  Result Date: 10/09/2019 CLINICAL DATA:  74 year old female with abdominal distension. Evaluate for small bowel obstruction. Status post recent appendectomy. EXAM: CT ABDOMEN AND PELVIS WITHOUT CONTRAST TECHNIQUE: Multidetector CT imaging of the abdomen and pelvis was performed following the standard protocol without IV contrast. COMPARISON:  Abdominal radiograph dated 10/09/2019 and CT abdomen pelvis dated 10/05/2019. FINDINGS: Evaluation of this exam is limited in the absence of intravenous contrast. Lower chest: The visualized lung bases are clear. Coronary vascular calcifications noted. No  intra-abdominal free air. Small free fluid in the pelvis. Hepatobiliary: Mild fatty liver. No intrahepatic biliary ductal dilatation. Probable small amount of gallbladder sludge versus vicarious excretion of recently administered contrast. No pericholecystic fluid or evidence of acute cholecystitis by CT. Pancreas: Unremarkable. No pancreatic ductal dilatation or surrounding inflammatory changes. Spleen: The spleen is small. Adrenals/Urinary Tract: The adrenal glands unremarkable. There is no hydronephrosis or nephrolithiasis on either side. The visualized ureters appear unremarkable. Small amount of air within the urinary bladder, likely related to recent instrumentation or catheterization. Clinical correlation is recommended. Stomach/Bowel:  The stomach is moderately distended with fluid content. Multiple dilated fluid-filled loops of small bowel in the mid to upper abdomen measure up to 3.5 cm in caliber. The distal small bowel and terminal ileum are collapsed. A transition is noted in the pelvis (51/2 and 70/5) most consistent with a small-bowel obstruction. The colon is unremarkable. Appendectomy clips. Vascular/Lymphatic: Minimal aortoiliac atherosclerotic disease. The IVC is unremarkable. No portal venous gas. There is no adenopathy. Reproductive: The uterus and ovaries are grossly unremarkable. Other: Induration of the periumbilical fat related to recent port placement. No fluid collection. Musculoskeletal: Degenerative changes and disc desiccation at L4-5. No acute osseous pathology. IMPRESSION: Small-bowel obstruction with transition in the lower abdomen/pelvis. Clinical correlation and follow-up recommended. Electronically Signed   By: Anner Crete M.D.   On: 10/09/2019 21:23   DG Abdomen 1 View  Result Date: 10/25/2019 CLINICAL DATA:  Enteric catheter placement EXAM: ABDOMEN - 1 VIEW COMPARISON:  10/24/2019 FINDINGS: Frontal view of the lower chest and upper abdomen demonstrates enteric catheter  coiled over gastric fundus. Persistent small bowel obstruction. IMPRESSION: 1. Enteric catheter over gastric fundus. Electronically Signed   By: Randa Ngo M.D.   On: 10/25/2019 01:25   CT ABDOMEN PELVIS W CONTRAST  Result Date: 10/05/2019 CLINICAL DATA:  Acute abdominal pain. Neutropenia. Bloating. EXAM: CT ABDOMEN AND PELVIS WITH CONTRAST TECHNIQUE: Multidetector CT imaging of the abdomen and pelvis was performed using the standard protocol following bolus administration of intravenous contrast. CONTRAST:  142mL OMNIPAQUE IOHEXOL 300 MG/ML  SOLN COMPARISON:  Radiograph earlier this day FINDINGS: Lower chest: Dependent atelectasis in both lower lobes. No pleural fluid. Hepatobiliary: No focal liver abnormality is seen. No gallstones, gallbladder wall thickening, or biliary dilatation. Pancreas: No ductal dilatation or inflammation. Spleen: Normal in size without focal abnormality. Adrenals/Urinary Tract: Normal adrenal glands. No hydronephrosis or perinephric edema. Homogeneous renal enhancement with symmetric excretion on delayed phase imaging. Urinary bladder is partially distended without wall thickening. Stomach/Bowel: Findings suspicious for acute appendicitis as described below. The appendix is dilated and fluid-filled. Fluid/ingested material in the stomach. There is no gastric wall thickening. Normal positioning of the ligament of Treitz. Scattered fluid-filled small bowel without evidence of obstruction or small bowel inflammation. Mild colonic tortuosity. Small volume of stool throughout the colon intermixed with colonic nondistention. Appendix: Location: Right upper quadrant coursing inferior to the liver, series 3, image 30, series 5, image 59. Diameter: 9 mm. Appendicolith: No. Mucosal hyper-enhancement: Yes. Extraluminal gas: No. Periappendiceal collection: No. Faint periappendiceal fat stranding about the tip. Vascular/Lymphatic: Normal caliber abdominal aorta. Mild distal aortic  atherosclerosis. The portal vein is patent. Mesenteric vessels are patent. No bulky abdominopelvic adenopathy. Reproductive: Postmenopausal appearance of the uterus. Small amount of free fluid in the pelvis. No obvious adnexal mass. Other: No intra-abdominal abscess or free air. Small amount of free fluid in the pelvis which is nonspecific, and may be reactive. Tiny fat containing umbilical hernia. Musculoskeletal: There are no acute or suspicious osseous abnormalities. Degenerative disc disease in the lower lumbar spine. Multilevel facet hypertrophy. IMPRESSION: 1. Findings consistent with uncomplicated acute appendicitis. The appendix courses into the right upper quadrant and is located just inferior to the liver. No abscess or perforation. 2. Small amount of free fluid in the pelvis is nonspecific, and may be reactive. Aortic Atherosclerosis (ICD10-I70.0). Electronically Signed   By: Keith Rake M.D.   On: 10/05/2019 23:19   DG Chest Port 1 View  Result Date: 10/11/2019 CLINICAL DATA:  Respiratory distress. EXAM: PORTABLE CHEST  1 VIEW COMPARISON:  06/27/2009 chest radiograph FINDINGS: The cardiomediastinal silhouette is unremarkable. There is no evidence of focal airspace disease, pulmonary edema, suspicious pulmonary nodule/mass, pleural effusion, or pneumothorax. No acute bony abnormalities are identified. An NG tube is present within the stomach. IMPRESSION: No evidence of acute cardiopulmonary disease. NG tube within the stomach. Electronically Signed   By: Margarette Canada M.D.   On: 10/11/2019 20:36   DG Abd 2 Views  Result Date: 10/12/2019 CLINICAL DATA:  Abdominal distension and nausea EXAM: ABDOMEN - 2 VIEW COMPARISON:  October 11, 2019 FINDINGS: Nasogastric tube tip and side port in stomach. There remains bowel dilatation with multiple air-fluid levels. A small amount of air and contrast are noted in the colon. No free air. Lung bases are clear. No abnormal calcifications. IMPRESSION: Findings  concerning for persistent degree of small bowel obstruction. No free air. Nasogastric tube tip and side port in stomach. Electronically Signed   By: Lowella Grip III M.D.   On: 10/12/2019 10:19   DG Abd 2 Views  Result Date: 10/11/2019 CLINICAL DATA:  Evaluate contrast transit, suspected bowel obstruction EXAM: ABDOMEN - 2 VIEW COMPARISON:  10/10/2019 FINDINGS: Interval transit of administered enteric contrast to the proximal transverse colon with scattered gas present to the transverse colon as well. The small bowel remains diffusely fluid-filled although not overtly distended. Esophagogastric tube with tip and side port below the diaphragm. No free air. IMPRESSION: 1. Interval transit of administered enteric contrast to the proximal transverse colon with scattered gas present to the transverse colon as well. The small bowel remains diffusely fluid-filled although not overtly distended. 2.  Esophagogastric tube with tip and side port below the diaphragm. Electronically Signed   By: Eddie Candle M.D.   On: 10/11/2019 12:17   DG Abd 2 Views  Result Date: 10/10/2019 CLINICAL DATA:  74 year old female with history of small-bowel obstruction. EXAM: ABDOMEN - 2 VIEW COMPARISON:  Abdominal radiograph 10/09/2019. FINDINGS: Nasogastric tube with tip in the body of the stomach and side port distal to the gastroesophageal junction. Multiple air-fluid levels are noted on the upright projection. Mildly dilated loop of gas-filled small bowel in the left upper quadrant measuring 4 cm in diameter. Small amount of gas and stool noted throughout the colon including the distal rectum. No pneumoperitoneum. Surgical clips project over the right-side of the abdomen. IMPRESSION: 1. Abnormal bowel gas pattern favored to reflect partial small bowel obstruction, as above. 2. Support apparatus, as above. 3. No pneumoperitoneum. Electronically Signed   By: Vinnie Langton M.D.   On: 10/10/2019 08:59   DG ABD ACUTE 2+V W 1V  CHEST  Result Date: 10/24/2019 CLINICAL DATA:  No bowel movement for 4 days, history of bowel obstruction, vomiting EXAM: DG ABDOMEN ACUTE W/ 1V CHEST COMPARISON:  10/13/2019 FINDINGS: Supine and upright frontal views of the abdomen as well as an upright frontal view of the chest are obtained. Cardiac silhouette is unremarkable. No airspace disease, effusion, or pneumothorax. Since the prior exam, there is been marked increasing caliber of distended gas-filled small bowel, now measuring up to 5.4 cm in diameter. There is a paucity of distal bowel gas, consistent with high-grade small bowel obstruction. Numerous gas fluid levels are identified. No free gas in the greater peritoneal sac. IMPRESSION: 1. High-grade small-bowel obstruction, with increased caliber of the small bowel since prior study. Electronically Signed   By: Randa Ngo M.D.   On: 10/24/2019 23:34   DG ABD ACUTE 2+V W 1V CHEST  Result Date: 10/09/2019 CLINICAL DATA:  74 year old female with vomiting and bloating. EXAM: DG ABDOMEN ACUTE W/ 1V CHEST COMPARISON:  Abdominal radiograph dated 10/05/2019 and CT dated 10/05/2019 FINDINGS: The lungs are clear. There is no pleural effusion or pneumothorax. The cardiac silhouette is within normal limits. Mild atherosclerotic calcification of the aortic arch. Mildly dilated loop of small bowel in the left upper abdomen measures up to 4 cm in diameter and may represent postoperative ileus versus obstruction. Clinical correlation is recommended. No free air or radiopaque calculi identified. Appendectomy clips. The osseous structures and soft tissues are unremarkable. A 16 mm rounded calcific density in the left upper quadrant likely in the left breast. IMPRESSION: 1. No acute cardiopulmonary process. 2. Mildly dilated loop of small bowel in the left upper abdomen may represent postoperative ileus versus obstruction. Electronically Signed   By: Anner Crete M.D.   On: 10/09/2019 20:02   DG Abd Portable  1V-Small Bowel Obstruction Protocol-initial, 8 hr delay  Result Date: 10/25/2019 CLINICAL DATA:  8 hour are bowel protocol EXAM: PORTABLE ABDOMEN - 1 VIEW COMPARISON:  Oct 25, 2019 FINDINGS: Again noted are significantly dilated loops of small bowel measuring up to approximately 5 cm. The enteric tube projects over the gastric body. The majority of the oral contrast remains within the stomach and small bowel. There is no definite oral contrast visualized within the colon. No pneumatosis. No free air. IMPRESSION: 1. Persistent small bowel obstruction with the majority of the oral contrast remaining within the small bowel. No oral contrast was visualized within the colon. 2. Enteric tube projects over the gastric body. Electronically Signed   By: Constance Holster M.D.   On: 10/25/2019 17:07   DG Abd Portable 1V  Result Date: 10/13/2019 CLINICAL DATA:  Nasogastric tube placement EXAM: PORTABLE ABDOMEN - 1 VIEW COMPARISON:  Yesterday FINDINGS: The nasogastric tube loops through the stomach with tip at the fundus. Dilated small bowel in the central abdomen measuring up to 4 cm in diameter. Oral contrast is again seen in the ascending colon. IMPRESSION: 1. Lengthened orogastric tube which loops through the stomach with tip at the gastric fundus. 2. Continued small bowel dilatation. Electronically Signed   By: Monte Fantasia M.D.   On: 10/13/2019 08:23   DG Abd Portable 1V  Result Date: 10/12/2019 CLINICAL DATA:  Nasogastric placement EXAM: PORTABLE ABDOMEN - 1 VIEW COMPARISON:  10/12/2019 FINDINGS: Orogastric or nasogastric tube enters the stomach, loops in the fundus and has its tip at the body antral junction. IMPRESSION: Orogastric or nasogastric tube tip at the body antral junction of the stomach. Electronically Signed   By: Nelson Chimes M.D.   On: 10/12/2019 16:13   DG Abd Portable 1V-Small Bowel Obstruction Protocol-initial, 8 hr delay  Result Date: 10/10/2019 CLINICAL DATA:  8 hour delay for  small-bowel obstruction. EXAM: PORTABLE ABDOMEN - 1 VIEW COMPARISON:  October 10, 2019 FINDINGS: A nasogastric tube is seen with its distal tip overlying the body of the stomach. Multiple dilated, mildly opacified small bowel loops are seen throughout the abdomen and pelvis. No contrast is seen within the large bowel. Radiopaque surgical clips are seen within the mid to lower right abdomen. IMPRESSION: Findings consistent with small-bowel obstruction. Electronically Signed   By: Virgina Norfolk M.D.   On: 10/10/2019 19:53   DG Abd Portable 1V-Small Bowel Protocol-Position Verification  Result Date: 10/10/2019 CLINICAL DATA:  NG tube placement EXAM: PORTABLE ABDOMEN - 1 VIEW COMPARISON:  October 10, 2019 FINDINGS: The distal  tip of the NG tube is in the right mid abdomen, likely in the distal stomach. IMPRESSION: Distal tip of the NG tube appears to be in the distal stomach. Electronically Signed   By: Dorise Bullion III M.D   On: 10/10/2019 13:54   DG Abd Portable 1 View  Result Date: 10/09/2019 CLINICAL DATA:  Nasogastric tube placement. EXAM: PORTABLE ABDOMEN - 1 VIEW COMPARISON:  October 09, 2019 (7:50 p.m.) FINDINGS: Since the prior study there has been interval placement of a nasogastric tube. Its distal tip is seen overlying the expected region of the body of the stomach. Persistently dilated small bowel loops are seen overlying the mid left abdomen. These are unchanged in caliber when compared to the prior exam. No radio-opaque calculi or other significant radiographic abnormality are seen. Radiopaque surgical clips are again seen overlying the mid right abdomen. IMPRESSION: 1. Nasogastric tube positioning, as described above. 2. Persistently dilated small bowel loops, which may reflect a small bowel obstruction versus ileus. Electronically Signed   By: Virgina Norfolk M.D.   On: 10/09/2019 23:46   [place] EKG SITE RITE  Result Date: 10/12/2019 If Site Rite image not attached, placement could not be  confirmed due to current cardiac rhythm.   Pathology:  SURGICAL PATHOLOGY  CASE: [id number]  PATIENT: Kelly Harrington  Surgical Pathology Report   Clinical History: Acute appendicitis ([facility])   FINAL MICROSCOPIC DIAGNOSIS:   A. 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  - The surgical resection margin is negative for tumor.  - See oncology table below.   ONCOLOGY TABLE:   APPENDIX:   Procedure: Appendectomy  Tumor Size: Approximately 7.0 cm  Histologic Type: Low-grade appendiceal mucinous neoplasm (LAMN)  Histologic Grade: Low-grade  Tumor Extension: Confined to appendiceal mucosa  Margins: Uninvolved by tumor  Lymphovascular Invasion: Not identified  Tumor Deposits: Not identified  Regional Lymph Nodes: No lymph nodes submitted or found  Pathologic Stage Classification (pTNM, AJCC 8th Edition): pTis, pNX  Representative Tumor Block: A1  Comment(s): The tumor involves most of the length of the specimen, and  is therefore estimated to span approximately 7.0 cm. Dr. Vic Ripper has  reviewed the case and concurs with this interpretation. Dr. Marlou Starks was  paged on 10/07/2019.   ([id number])   Assessment and Plan:  1.  Low-grade appendiceal mucinous neoplasm, pTis, pNX 2.  Bowel obstruction 3.  Mild anemia 4.  AKI secondary to dehydration, improving  -Pathology results discussed with the patient and her husband.  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 -We do not recommend any additional treatment for her appendiceal carcinoma. -SBO management per general surgery. -Patient will be seen by Dr. Burr Medico later today.  Thank you for this referral.   Mikey Bussing, DNP, AGPCNP-BC, AOCNP  Addendum  I have seen the patient, examined her. I agree with the assessment and and plan and have edited the notes.   Ms Lanice Schwab was scheduled to see me in my office  today, but she was admitted to hospital for bowel obstruction.  She was referred by her surgeon Dr. Marlou Starks. I reviewed the surgical pathology findings from her hepatectomy, which showed low-grade mucinous neoplasm.  The tumor was limited to in the mucosa, surgical margins were negative, she did not have appendiceal perforation.  We discussed the very low risk of recurrence after completed surgical resection, no routine lab, skin, or follow-up is required.  She was very  happy to hear the news.  I will see her only as needed.  I hope her small bowel obstruction will resolve soon.  All questions were answered. Will copy Dr. Marlou Starks.   Truitt Merle  10/26/2019

## 2019-10-26 NOTE — Telephone Encounter (Signed)
I received a scheduling msg to reschedule Ms. Kelly Harrington' appt w/Lacie because the pt is currently in the hospital. I rescheduled her to see Lacie on 5/20 at 1:45pm.

## 2019-10-27 DIAGNOSIS — R9431 Abnormal electrocardiogram [ECG] [EKG]: Secondary | ICD-10-CM

## 2019-10-27 LAB — CBC
HCT: 32.7 % — ABNORMAL LOW (ref 36.0–46.0)
Hemoglobin: 10.3 g/dL — ABNORMAL LOW (ref 12.0–15.0)
MCH: 29.8 pg (ref 26.0–34.0)
MCHC: 31.5 g/dL (ref 30.0–36.0)
MCV: 94.5 fL (ref 80.0–100.0)
Platelets: 404 10*3/uL — ABNORMAL HIGH (ref 150–400)
RBC: 3.46 MIL/uL — ABNORMAL LOW (ref 3.87–5.11)
RDW: 12.8 % (ref 11.5–15.5)
WBC: 6.7 10*3/uL (ref 4.0–10.5)
nRBC: 0 % (ref 0.0–0.2)

## 2019-10-27 LAB — BASIC METABOLIC PANEL
Anion gap: 10 (ref 5–15)
BUN: 21 mg/dL (ref 8–23)
CO2: 24 mmol/L (ref 22–32)
Calcium: 8.6 mg/dL — ABNORMAL LOW (ref 8.9–10.3)
Chloride: 105 mmol/L (ref 98–111)
Creatinine, Ser: 0.64 mg/dL (ref 0.44–1.00)
GFR calc Af Amer: >60 mL/min (ref >60)
GFR calc non Af Amer: >60 mL/min (ref >60)
Glucose, Bld: 68 mg/dL — ABNORMAL LOW (ref 70–99)
Potassium: 3.5 mmol/L (ref 3.5–5.1)
Sodium: 139 mmol/L (ref 135–145)

## 2019-10-27 LAB — MAGNESIUM: Magnesium: 2.1 mg/dL (ref 1.7–2.4)

## 2019-10-27 NOTE — Consult Note (Addendum)
Cardiology Consultation:   Patient ID: Kelly Harrington MRN: [id number]; DOB: September 17, 1945  Admit date: 10/24/2019 Date of Consult: 10/27/2019  Primary Care Provider: Alroy Dust, L.Marlou Sa, MD Primary Cardiologist: New to [place] (Dr. Margaretann Loveless) Primary Electrophysiologist:  None    Patient Profile:   Kelly Harrington is a 74 y.o. female with a history of hypertension and recent appendectomy complicated by small bowel obstruction who is being seen today for the evaluation of abnormal EKG at the request of Dr. Johney Maine.   History of Present Illness:   Kelly Harrington is a 74 year old female with the above history.Patient underwent a laparoscopic appendectomy for acute appendicitis on 10/06/2019. 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 She was discharged home on 10/08/2019 but then readmitted the next day with a small bowel obstruction vs ileus. She improved with conservative management and was discharged home on 10/19/2019. She was tolerating a diet and having bowel movement on discharge. She reports doing well initially but then vomiting and abdominal distension returned and she was unable to have a bowel movement. She took a suppository on the day prior to admission and had some diarrhea but still had worsening abdominal distension. Therefore, she decided to return to the ED on 10/24/2019 for further evaluation. She was found to be dehydrated with AKI and abdominal x-ray showed high-grade small bowel obstruction with increased caliber of the small bowel since prior study. NG tube was placed and General Surgery was consulted. General Surgery is planning clamping trial.   Patient was reportedly noted to have an irregular heart rhythm today. EKG automatically read as accelerated junction rhythm but I think it may be sinus rhythm. There is some baseline artifact but  also showed T wave inversion in inferior leads and lead V6. Cardiology consulted for further evaluation of abnormal EKG.   Patient denies any known cardiac history. She reports having a treadmill stress test probably about 20 years ago but no other cardiac work-up since that time. She has history of hyperlipidemia for which she takes Lipitor three times per week (due to myalgias) and what sounds like borderline hypertension being managed with diet and exercise but no known diabetes. She has a remote smoking history but quit in the 1973. She does have a family history of CAD with both her mother and father having heart attacks in their 65's. She notes previously being told her heart skips a beat; however, completely unaware of this. She denies any cardiac symptoms including chest pain, shortness of breath, orthopnea, PND, lower extremity, or palpitations. She notes a little lightheadedness with recent bowel obstructions but nothing significant. No falls or syncope. No recent fevers or illnesses. No abnormal bleeding or bruising.  Patient very active and walks on treadmill for about 1 hour 3-4 times per week. No symptoms with this.   Past Medical History:  Diagnosis Date  . High cholesterol   . SBO (small bowel obstruction) (HCC)     Past Surgical History:  Procedure Laterality Date  . APPENDECTOMY    . LAPAROSCOPIC APPENDECTOMY N/A 10/06/2019   Procedure: APPENDECTOMY LAPAROSCOPIC;  Surgeon: Jovita Kussmaul, MD;  Location: [facility];  Service: General;  Laterality: N/A;  . ROTATOR CUFF REPAIR    . TRIGGER FINGER RELEASE       Home Medications:  Prior to Admission medications   Medication Sig Start Date End Date Taking? Authorizing Provider  acetaminophen (TYLENOL) 500  MG tablet Take 500-1,000 mg by mouth every 6 (six) hours as needed for moderate pain.   Yes [provider]  atorvastatin (LIPITOR) 40 MG tablet Take 40 mg by mouth every Monday, Wednesday, and Friday. Take on MWF   Yes  [provider]  Biotin 1 MG CAPS Take 1 capsule by mouth daily.   Yes [provider]  cetirizine (ZYRTEC) 10 MG tablet Take 10 mg by mouth daily as needed for allergies.   Yes [provider]  cholecalciferol (VITAMIN D3) 25 MCG (1000 UNIT) tablet Take 1,000 Units by mouth daily.   Yes [provider]  feeding supplement, ENSURE ENLIVE, (ENSURE ENLIVE) LIQD Take 237 mLs by mouth 2 (two) times daily between meals. 10/19/19  Yes Meuth, Brooke A, PA-C  Multiple Vitamin (MULTIVITAMIN WITH MINERALS) TABS tablet Take 1 tablet by mouth daily. 10/19/19  Yes Meuth, Blaine Hamper, PA-C    Inpatient Medications: Scheduled Meds: . enoxaparin (LOVENOX) injection  40 mg Subcutaneous Q24H   Continuous Infusions: . 0.9 % NaCl with KCl 20 mEq / L 100 mL/hr at 10/27/19 0600   PRN Meds: fentaNYL (SUBLIMAZE) injection, metoprolol tartrate, ondansetron **OR** ondansetron (ZOFRAN) IV  Allergies:    Allergies  Allergen Reactions  . Codeine Nausea And Vomiting    Social History:   Social History   Socioeconomic History  . Marital status: Married    Spouse name: Not on file  . Number of children: Not on file  . Years of education: Not on file  . Highest education level: Not on file  Occupational History  . Not on file  Tobacco Use  . Smoking status: Never Smoker  . Smokeless tobacco: Never Used  Substance and Sexual Activity  . Alcohol use: No  . Drug use: No  . Sexual activity: Not on file  Other Topics Concern  . Not on file  Social History Narrative  . Not on file   Social Determinants of Health   Financial Resource Strain:   . Difficulty of Paying Living Expenses:   Food Insecurity:   . Worried About Charity fundraiser in the Last Year:   . Arboriculturist in the Last Year:   Transportation Needs:   . Film/video editor (Medical):   Marland Kitchen Lack of Transportation (Non-Medical):   Physical Activity:   . Days of Exercise per Week:   . Minutes of  Exercise per Session:   Stress:   . Feeling of Stress :   Social Connections:   . Frequency of Communication with Friends and Family:   . Frequency of Social Gatherings with Friends and Family:   . Attends Religious Services:   . Active Member of Clubs or Organizations:   . Attends Archivist Meetings:   Marland Kitchen Marital Status:   Intimate Partner Violence:   . Fear of Current or Ex-Partner:   . Emotionally Abused:   Marland Kitchen Physically Abused:   . Sexually Abused:     Family History:    Family History  Problem Relation Age of Onset  . Heart failure Mother   . Hypertension Mother   . Heart failure Father   . Hypertension Father      ROS:  Please see the history of present illness.  All other ROS reviewed and negative.     Physical Exam/Data:   Vitals:   10/26/19 1440 10/26/19 2110 10/27/19 0526 10/27/19 1339  BP: 139/86 137/83 (!) 145/82 126/87  Pulse: 90 83 95 88  Resp: 17 16 16 17   Temp: 97.9 F (36.6 C) 98 F (36.7 C) 98.1 F (36.7 C) 98.8 F (37.1 C)  TempSrc: Oral Oral Oral Oral  SpO2: 100% 99% 98% 98%  Weight:      Height:        Intake/Output Summary (Last 24 hours) at 10/27/2019 1456 Last data filed at 10/27/2019 1340 Gross per 24 hour  Intake 2867.32 ml  Output 2000 ml  Net 867.32 ml   Last 3 Weights 10/25/2019 10/12/2019 10/09/2019  Weight (lbs) 120 lb 6.3 oz 120 lb 6.4 oz 126 lb 1.7 oz  Weight (kg) 54.61 kg 54.613 kg 57.2 kg     Body mass index is 22.75 kg/m.  General: 74 y.o. female resting comfortably in no acute distress.  HEENT: Normocephalic and atraumatic. Sclera clear.  Neck: Supple. No carotid bruits.  Heart: RRR. Distinct S1 and S2. No murmurs, gallops, or rubs. Radial pulses 2+ and equal bilaterally. Lungs: No increased work of breathing. Clear to ausculation bilaterally. No wheezes, rhonchi, or rales.  Abdomen: Soft, non-distended, and non-tender to palpation. Bowel sounds present. Extremities: No lower extremity edema.    Skin: Warm and  dry. Neuro: Alert and oriented x3. No focal deficits. Psych: Normal affect. Responds appropriately.   EKG:  The EKG was personally reviewed and demonstrates:  Normal sinus rhythm, rate 88 bpm, with LVH and underlying artifact but mild T wave inversions in inferior leads and lead 6.   Telemetry:  Patient not on telemetry.  Relevant CV Studies: None.  Laboratory Data:  High Sensitivity Troponin:  No results for input(s): TROPONINIHS in the last 720 hours.   Chemistry Recent Labs  Lab 10/25/19 0422 10/26/19 0731 10/27/19 0238  NA 132* 141 139  K 3.5 3.5 3.5  CL 94* 105 105  CO2 24 22 24   GLUCOSE 88 87 68*  BUN 45* 39* 21  CREATININE 1.00 0.99 0.64  CALCIUM 9.3 9.0 8.6*  GFRNONAA 57* 57* >60  GFRAA >60 >60 >60  ANIONGAP 14 14 10     Recent Labs  Lab 10/24/19 2258  PROT 9.3*  ALBUMIN 5.1*  AST 29  ALT 35  ALKPHOS 81  BILITOT 2.1*   Hematology Recent Labs  Lab 10/25/19 0422 10/26/19 0731 10/27/19 0238  WBC 9.1 8.9 6.7  RBC 3.97 3.84* 3.46*  HGB 11.9* 11.4* 10.3*  HCT 36.3 35.9* 32.7*  MCV 91.4 93.5 94.5  MCH 30.0 29.7 29.8  MCHC 32.8 31.8 31.5  RDW 12.5 12.9 12.8  PLT 454* 467* 404*   BNPNo results for input(s): BNP, PROBNP in the last 168 hours.  DDimer No results for input(s): DDIMER in the last 168 hours.   Radiology/Studies:  DG Abdomen 1 View  Result Date: 10/25/2019 CLINICAL DATA:  Enteric catheter placement EXAM: ABDOMEN - 1 VIEW COMPARISON:  10/24/2019 FINDINGS: Frontal view of the lower chest and upper abdomen demonstrates enteric catheter coiled over gastric fundus. Persistent small bowel obstruction. IMPRESSION: 1. Enteric catheter over gastric fundus. Electronically Signed   By: Randa Ngo M.D.   On: 10/25/2019 01:25   CT ABDOMEN PELVIS W CONTRAST  Result Date: 10/26/2019 CLINICAL DATA:  74 year old female with nausea, appendectomy last month, with subsequent small bowel obstruction. EXAM: CT ABDOMEN AND PELVIS WITH CONTRAST TECHNIQUE:  Multidetector CT imaging of the abdomen and pelvis was performed using the standard protocol following bolus administration of intravenous contrast. CONTRAST:  46mL OMNIPAQUE IOHEXOL 300 MG/ML  SOLN COMPARISON:  CT Abdomen and Pelvis 10/09/2019 and earlier.  FINDINGS: Lower chest: Negative aside from evidence of calcified coronary artery atherosclerosis, mild right lower lobe atelectasis. Hepatobiliary: Negative liver and gallbladder. Pancreas: Negative. Spleen: Negative. Adrenals/Urinary Tract: Stable and negative, with homogeneous contrast enhancement and excretion from the kidneys. Proximal ureters are within normal limits. Unremarkable urinary bladder. Stomach/Bowel: Enteric tube courses through the lower esophagus terminating in the distal gastric body. Oral contrast has reached the rectum, although some contrast was administered late last month. Although there are dilated air and contrast containing small bowel loops up to 39 mm diameter. The most abrupt small-bowel transition identified is in the mid abdomen deep to the umbilicus where there is some regional swirling in the mesentery. See series 2, image 48 and coronal image 38. however, distal to this there is a somewhat gradual transition to more fully decompressed small bowel loops which continue to the terminal ileum. Surgical clips at the site of appendectomy again noted. Large bowel caliber is within normal limits. The large bowel oral contrast is more concentrated than that in the distal small bowel. No free air.  No free fluid identified in the abdomen. Ventral abdominal wall postoperative changes but no abdominal hernia identified. Probable subcutaneous multiple nodular injection sites which are new since 10/05/2019. Vascular/Lymphatic: Major arterial structures are patent. Mild Aortoiliac calcified atherosclerosis. portal venous system is patent. No lymphadenopathy. Reproductive: Negative. Other: Small volume of simple appearing pelvic free fluid on  series 2, image 63. Musculoskeletal: No acute osseous abnormality identified. IMPRESSION: 1. Dilated small bowel is compatible with ongoing small-bowel obstruction, and a relative transition point is identified in an area of mesenteric swirling on coronal image 38, series 2, image 48. However, distal to that area there is a more gradual transition to more completely decompressed small bowel which continues to the terminal ileum. Top considerations are internal hernia versus adhesions in this clinical setting. 2. There is oral contrast throughout the large bowel to the rectum, but is probably that which was administered late last month. 3. No free air. Small volume of simple appearing free fluid in the pelvis. 4. Sequelae of appendectomy. Electronically Signed   By: Genevie Ann M.D.   On: 10/26/2019 15:16   DG ABD ACUTE 2+V W 1V CHEST  Result Date: 10/24/2019 CLINICAL DATA:  No bowel movement for 4 days, history of bowel obstruction, vomiting EXAM: DG ABDOMEN ACUTE W/ 1V CHEST COMPARISON:  10/13/2019 FINDINGS: Supine and upright frontal views of the abdomen as well as an upright frontal view of the chest are obtained. Cardiac silhouette is unremarkable. No airspace disease, effusion, or pneumothorax. Since the prior exam, there is been marked increasing caliber of distended gas-filled small bowel, now measuring up to 5.4 cm in diameter. There is a paucity of distal bowel gas, consistent with high-grade small bowel obstruction. Numerous gas fluid levels are identified. No free gas in the greater peritoneal sac. IMPRESSION: 1. High-grade small-bowel obstruction, with increased caliber of the small bowel since prior study. Electronically Signed   By: Randa Ngo M.D.   On: 10/24/2019 23:34   DG Abd Portable 1V-Small Bowel Obstruction Protocol-initial, 8 hr delay  Result Date: 10/25/2019 CLINICAL DATA:  8 hour are bowel protocol EXAM: PORTABLE ABDOMEN - 1 VIEW COMPARISON:  Oct 25, 2019 FINDINGS: Again noted are  significantly dilated loops of small bowel measuring up to approximately 5 cm. The enteric tube projects over the gastric body. The majority of the oral contrast remains within the stomach and small bowel. There is no definite oral contrast visualized  within the colon. No pneumatosis. No free air. IMPRESSION: 1. Persistent small bowel obstruction with the majority of the oral contrast remaining within the small bowel. No oral contrast was visualized within the colon. 2. Enteric tube projects over the gastric body. Electronically Signed   By: Constance Holster M.D.   On: 10/25/2019 17:07   Assessment and Plan:   Abnormal EKG - Patient admitted with concerns for bowel obstruction. Noted to have irregular rhythm on exam today so EKG was ordered and was abnormal.  - EKG automatically read as accelerated junction rhythm but I think it is sinus rhythm. Also has LVH and mild T wave inversion in inferior leads and lead V6 but there is some underlying artifact. - Patient is very active and walks 1 hour 3-4 times per week. Denies any cardiac symptoms.  - Will repeat an EKG. - Looks like there may be some coronary calcification on CT so would consider home Lipitor when able to take PO. Could also consider adding Aspirin 81mg  daily for secondary prevention when able to take PO. - Do no anticipate any additional work-up at this time. Can follow-up as outpatient. - Looks like there may be some coronary calcification on CT so would consider home Lipitor when able to take PO. Could also consider adding Aspirin 81mg  daily for secondary prevention when able to take PO.  Otherwise, per primary team.   For questions or updates, please contact [facility] Please consult [email or web address] for contact info under     Signed, Darreld Mclean, PA-C  10/27/2019 2:56 PM  Patient seen and examined with Sande Rives PA-C.  Agree as above, with the following exceptions and changes as noted below.  Mrs. Gowan is seen  today with her husband present.  She is a healthy 74 year old female with a history of hypertension and recent appendectomy with readmission for small bowel obstruction currently being managed conservatively with NG tube in place.  She is sitting up on the side of the bed, speaks comfortably in full sentences, and denies chest pain or shortness of breath.  We will consulted for an abnormal ECG.  ECG demonstrates sinus rhythm with upright P waves in inferior leads.  Computer interpretation suggested accelerated junctional rhythm.  She has no prior cardiovascular history but does have a history of hypertension, and has evidence of LVH on ECG, there are T wave inversions laterally, which may be repolarization change. Gen: NAD, NG tube in place, CV: RRR, no murmurs, Lungs: clear, Abd: soft, Extrem: Warm, well perfused, no edema, Neuro/Psych: alert and oriented x 3, normal mood and affect. All available labs, radiology testing, previous records reviewed.   She has a mildly abnormal ECG with evidence of sinus rhythm, LVH, and probable repolarization change.  She has coronary artery calcifications seen on limited views of the heart from her CT abdomen pelvis obtained this admission.  Coronary calcifications are seen in the LAD and are likely in the proximal LAD.  No definite left main calcifications.  No pericardial effusion seen on CT, atria look to be relatively normal size.  No further cardiovascular testing is likely needed inpatient, however I would like to see the patient outside the hospital in cardiovascular follow-up.  I have outpatient follow-up in my clinic in approximately 4 to 6 weeks for further investigation.  We will likely complete an echocardiogram as an outpatient, and can consider further investigation of coronary artery calcifications.  She currently takes Lipitor times a week and is limited due to myalgias.  With evidence of coronary artery disease, we may need to discuss PCSK9 inhibitor.  We  will obtain a repeat ECG and we will see the patient in the morning.  If there is any suggestion of persistent pathology on her repeat ECG, can consider echocardiogram while in hospital.  Elouise Munroe, MD 10/27/19 3:45 PM

## 2019-10-27 NOTE — Progress Notes (Signed)
[facility] Progress Note     Subjective: CC-  Sitting up in bed. States that she is passing flatus and had 2 loose BMs this morning. Denies abdominal pain. NG tube output less (1150cc/24 hr). WBC 6.7, afebrile.  Objective: Vital signs in last 24 hours: Temp:  [97.9 F (36.6 C)-98.1 F (36.7 C)] 98.1 F (36.7 C) (05/11 0526) Pulse Rate:  [83-95] 95 (05/11 0526) Resp:  [16-17] 16 (05/11 0526) BP: (137-145)/(82-86) 145/82 (05/11 0526) SpO2:  [98 %-100 %] 98 % (05/11 0526) Last BM Date: 10/26/19  Intake/Output from previous day: 05/10 0701 - 05/11 0700 In: 2696.8 [P.O.:380; I.V.:2316.8] Out: 1450 [Urine:300; Emesis/NG output:1150] Intake/Output this shift: Total I/O In: 60 [P.O.:60] Out: 200 [Urine:200]  PE: Gen:  Alert, NAD, pleasant Cardio: irregular rhythm Pulm:  rate and effort normal Abd: Soft, NT/ND, +BS, lap incisions C/D/I  Lab Results:  Recent Labs    10/26/19 0731 10/27/19 0238  WBC 8.9 6.7  HGB 11.4* 10.3*  HCT 35.9* 32.7*  PLT 467* 404*   BMET Recent Labs    10/26/19 0731 10/27/19 0238  NA 141 139  K 3.5 3.5  CL 105 105  CO2 22 24  GLUCOSE 87 68*  BUN 39* 21  CREATININE 0.99 0.64  CALCIUM 9.0 8.6*   PT/INR No results for input(s): LABPROT, INR in the last 72 hours. CMP     Component Value Date/Time   NA 139 10/27/2019 0238   K 3.5 10/27/2019 0238   CL 105 10/27/2019 0238   CO2 24 10/27/2019 0238   GLUCOSE 68 (L) 10/27/2019 0238   BUN 21 10/27/2019 0238   CREATININE 0.64 10/27/2019 0238   CALCIUM 8.6 (L) 10/27/2019 0238   PROT 9.3 (H) 10/24/2019 2258   ALBUMIN 5.1 (H) 10/24/2019 2258   AST 29 10/24/2019 2258   ALT 35 10/24/2019 2258   ALKPHOS 81 10/24/2019 2258   BILITOT 2.1 (H) 10/24/2019 2258   GFRNONAA >60 10/27/2019 0238   GFRAA >60 10/27/2019 0238   Lipase     Component Value Date/Time   LIPASE 42 10/24/2019 2258       Studies/Results: CT ABDOMEN PELVIS W CONTRAST  Result Date: 10/26/2019 CLINICAL  DATA:  73 year old female with nausea, appendectomy last month, with subsequent small bowel obstruction. EXAM: CT ABDOMEN AND PELVIS WITH CONTRAST TECHNIQUE: Multidetector CT imaging of the abdomen and pelvis was performed using the standard protocol following bolus administration of intravenous contrast. CONTRAST:  44mL OMNIPAQUE IOHEXOL 300 MG/ML  SOLN COMPARISON:  CT Abdomen and Pelvis 10/09/2019 and earlier. FINDINGS: Lower chest: Negative aside from evidence of calcified coronary artery atherosclerosis, mild right lower lobe atelectasis. Hepatobiliary: Negative liver and gallbladder. Pancreas: Negative. Spleen: Negative. Adrenals/Urinary Tract: Stable and negative, with homogeneous contrast enhancement and excretion from the kidneys. Proximal ureters are within normal limits. Unremarkable urinary bladder. Stomach/Bowel: Enteric tube courses through the lower esophagus terminating in the distal gastric body. Oral contrast has reached the rectum, although some contrast was administered late last month. Although there are dilated air and contrast containing small bowel loops up to 39 mm diameter. The most abrupt small-bowel transition identified is in the mid abdomen deep to the umbilicus where there is some regional swirling in the mesentery. See series 2, image 48 and coronal image 38. however, distal to this there is a somewhat gradual transition to more fully decompressed small bowel loops which continue to the terminal ileum. Surgical clips at the site of appendectomy again noted. Large bowel caliber is  within normal limits. The large bowel oral contrast is more concentrated than that in the distal small bowel. No free air.  No free fluid identified in the abdomen. Ventral abdominal wall postoperative changes but no abdominal hernia identified. Probable subcutaneous multiple nodular injection sites which are new since 10/05/2019. Vascular/Lymphatic: Major arterial structures are patent. Mild Aortoiliac  calcified atherosclerosis. portal venous system is patent. No lymphadenopathy. Reproductive: Negative. Other: Small volume of simple appearing pelvic free fluid on series 2, image 63. Musculoskeletal: No acute osseous abnormality identified. IMPRESSION: 1. Dilated small bowel is compatible with ongoing small-bowel obstruction, and a relative transition point is identified in an area of mesenteric swirling on coronal image 38, series 2, image 48. However, distal to that area there is a more gradual transition to more completely decompressed small bowel which continues to the terminal ileum. Top considerations are internal hernia versus adhesions in this clinical setting. 2. There is oral contrast throughout the large bowel to the rectum, but is probably that which was administered late last month. 3. No free air. Small volume of simple appearing free fluid in the pelvis. 4. Sequelae of appendectomy. Electronically Signed   By: Genevie Ann M.D.   On: 10/26/2019 15:16   DG Abd Portable 1V-Small Bowel Obstruction Protocol-initial, 8 hr delay  Result Date: 10/25/2019 CLINICAL DATA:  8 hour are bowel protocol EXAM: PORTABLE ABDOMEN - 1 VIEW COMPARISON:  Oct 25, 2019 FINDINGS: Again noted are significantly dilated loops of small bowel measuring up to approximately 5 cm. The enteric tube projects over the gastric body. The majority of the oral contrast remains within the stomach and small bowel. There is no definite oral contrast visualized within the colon. No pneumatosis. No free air. IMPRESSION: 1. Persistent small bowel obstruction with the majority of the oral contrast remaining within the small bowel. No oral contrast was visualized within the colon. 2. Enteric tube projects over the gastric body. Electronically Signed   By: Constance Holster M.D.   On: 10/25/2019 17:07    Anti-infectives: Anti-infectives (From admission, onward)   None       Assessment/Plan HLD Blood products refusal AKI - resolved, Cr  0.99 Anemia - Hgb 10.3, monitor  Acute appendicitis S/p laparoscopic appendectomy 4/20 Dr. Marlou Starks - POD#21 - path: Low-grade appendiceal mucinous neoplasm (LAMN) >> oncology to see - readmitted 5/9 with concern for SBO - CT 5/10 reviewed with MD, mild swirling mesentery but there is no evidence of any transition point, perforation, abscess; contrast in colon  ID - none FEN - IVF, clamp NG tube, CLD VTE - SCDs, lovenox Foley - none Follow up - TBD  Plan - Patient clinically improving and having bowel function. Will clamp NG tube, allow clear liquids and continue to monitor. Labs in AM. I called and updated the patient's husband.   LOS: 2 days    Wellington Hampshire, [facility] 10/27/2019, 9:28 AM Please see Amion for pager number during day hours 7:00am-4:30pm

## 2019-10-28 ENCOUNTER — Inpatient Hospital Stay (HOSPITAL_COMMUNITY): Payer: Medicare Other

## 2019-10-28 ENCOUNTER — Telehealth: Payer: Self-pay | Admitting: Hematology

## 2019-10-28 DIAGNOSIS — R9431 Abnormal electrocardiogram [ECG] [EKG]: Secondary | ICD-10-CM

## 2019-10-28 LAB — BASIC METABOLIC PANEL
Anion gap: 8 (ref 5–15)
BUN: 9 mg/dL (ref 8–23)
CO2: 23 mmol/L (ref 22–32)
Calcium: 8.3 mg/dL — ABNORMAL LOW (ref 8.9–10.3)
Chloride: 104 mmol/L (ref 98–111)
Creatinine, Ser: 0.54 mg/dL (ref 0.44–1.00)
GFR calc Af Amer: >60 mL/min (ref >60)
GFR calc non Af Amer: >60 mL/min (ref >60)
Glucose, Bld: 79 mg/dL (ref 70–99)
Potassium: 3.2 mmol/L — ABNORMAL LOW (ref 3.5–5.1)
Sodium: 135 mmol/L (ref 135–145)

## 2019-10-28 LAB — ECHOCARDIOGRAM COMPLETE
Height: 61 in
Weight: 1926.29 oz

## 2019-10-28 LAB — CBC
HCT: 31.2 % — ABNORMAL LOW (ref 36.0–46.0)
Hemoglobin: 9.9 g/dL — ABNORMAL LOW (ref 12.0–15.0)
MCH: 29.7 pg (ref 26.0–34.0)
MCHC: 31.7 g/dL (ref 30.0–36.0)
MCV: 93.7 fL (ref 80.0–100.0)
Platelets: 388 10*3/uL (ref 150–400)
RBC: 3.33 MIL/uL — ABNORMAL LOW (ref 3.87–5.11)
RDW: 12.5 % (ref 11.5–15.5)
WBC: 5 10*3/uL (ref 4.0–10.5)
nRBC: 0 % (ref 0.0–0.2)

## 2019-10-28 LAB — MAGNESIUM: Magnesium: 1.7 mg/dL (ref 1.7–2.4)

## 2019-10-28 MED ORDER — BOOST / RESOURCE BREEZE PO LIQD CUSTOM
1.0000 | Freq: Two times a day (BID) | ORAL | Status: DC
  Administered 2019-10-28 – 2019-11-04 (×14): 1 via ORAL

## 2019-10-28 MED ORDER — POTASSIUM CHLORIDE CRYS ER 20 MEQ PO TBCR
40.0000 meq | EXTENDED_RELEASE_TABLET | Freq: Two times a day (BID) | ORAL | Status: AC
  Administered 2019-10-28 (×2): 40 meq via ORAL
  Filled 2019-10-28 (×2): qty 2

## 2019-10-28 MED ORDER — MENTHOL 3 MG MT LOZG
1.0000 | LOZENGE | OROMUCOSAL | Status: DC | PRN
  Filled 2019-10-28: qty 9

## 2019-10-28 MED ORDER — MAGNESIUM SULFATE 50 % IJ SOLN: 1.0000 g | Freq: Once | INTRAMUSCULAR | Status: DC

## 2019-10-28 MED ORDER — MAGNESIUM SULFATE IN D5W 1-5 GM/100ML-% IV SOLN
1.0000 g | Freq: Once | INTRAVENOUS | Status: AC
  Administered 2019-10-28: 1 g via INTRAVENOUS
  Filled 2019-10-28: qty 100

## 2019-10-28 NOTE — Progress Notes (Signed)
[facility] Progress Note     Subjective: CC-  NG tube clamped x24 hours. States that she does get full quickly, but denies any nausea, vomiting, or abdominal bloating. Tolerating clear liquids. Continues to pass flatus and had another loose BM this morning. Very nervous about taking NG tube out at the risk of having to have it replaced.  Objective: Vital signs in last 24 hours: Temp:  [98.3 F (36.8 C)-98.8 F (37.1 C)] 98.5 F (36.9 C) (05/12 0605) Pulse Rate:  [86-92] 86 (05/12 0605) Resp:  [15-17] 15 (05/12 0605) BP: (126-141)/(87-92) 141/92 (05/12 0605) SpO2:  [98 %-100 %] 99 % (05/12 0605) Last BM Date: 10/27/19  Intake/Output from previous day: 05/11 0701 - 05/12 0700 In: 2983.7 [P.O.:600; I.V.:2383.7] Out: 550 [Urine:400; Emesis/NG output:150] Intake/Output this shift: No intake/output data recorded.  PE: Gen: Alert, NAD, pleasant Cardio: RRR Pulm: CTAB, rate and effort normal Abd: Soft, NT/ND, +BS,lapincisions C/D/I  Lab Results:  Recent Labs    10/27/19 0238 10/28/19 0302  WBC 6.7 5.0  HGB 10.3* 9.9*  HCT 32.7* 31.2*  PLT 404* 388   BMET Recent Labs    10/27/19 0238 10/28/19 0302  NA 139 135  K 3.5 3.2*  CL 105 104  CO2 24 23  GLUCOSE 68* 79  BUN 21 9  CREATININE 0.64 0.54  CALCIUM 8.6* 8.3*   PT/INR No results for input(s): LABPROT, INR in the last 72 hours. CMP     Component Value Date/Time   NA 135 10/28/2019 0302   K 3.2 (L) 10/28/2019 0302   CL 104 10/28/2019 0302   CO2 23 10/28/2019 0302   GLUCOSE 79 10/28/2019 0302   BUN 9 10/28/2019 0302   CREATININE 0.54 10/28/2019 0302   CALCIUM 8.3 (L) 10/28/2019 0302   PROT 9.3 (H) 10/24/2019 2258   ALBUMIN 5.1 (H) 10/24/2019 2258   AST 29 10/24/2019 2258   ALT 35 10/24/2019 2258   ALKPHOS 81 10/24/2019 2258   BILITOT 2.1 (H) 10/24/2019 2258   GFRNONAA >60 10/28/2019 0302   GFRAA >60 10/28/2019 0302   Lipase     Component Value Date/Time   LIPASE 42 10/24/2019  2258       Studies/Results: CT ABDOMEN PELVIS W CONTRAST  Result Date: 10/26/2019 CLINICAL DATA:  74 year old female with nausea, appendectomy last month, with subsequent small bowel obstruction. EXAM: CT ABDOMEN AND PELVIS WITH CONTRAST TECHNIQUE: Multidetector CT imaging of the abdomen and pelvis was performed using the standard protocol following bolus administration of intravenous contrast. CONTRAST:  30mL OMNIPAQUE IOHEXOL 300 MG/ML  SOLN COMPARISON:  CT Abdomen and Pelvis 10/09/2019 and earlier. FINDINGS: Lower chest: Negative aside from evidence of calcified coronary artery atherosclerosis, mild right lower lobe atelectasis. Hepatobiliary: Negative liver and gallbladder. Pancreas: Negative. Spleen: Negative. Adrenals/Urinary Tract: Stable and negative, with homogeneous contrast enhancement and excretion from the kidneys. Proximal ureters are within normal limits. Unremarkable urinary bladder. Stomach/Bowel: Enteric tube courses through the lower esophagus terminating in the distal gastric body. Oral contrast has reached the rectum, although some contrast was administered late last month. Although there are dilated air and contrast containing small bowel loops up to 39 mm diameter. The most abrupt small-bowel transition identified is in the mid abdomen deep to the umbilicus where there is some regional swirling in the mesentery. See series 2, image 48 and coronal image 38. however, distal to this there is a somewhat gradual transition to more fully decompressed small bowel loops which continue to the terminal ileum.  Surgical clips at the site of appendectomy again noted. Large bowel caliber is within normal limits. The large bowel oral contrast is more concentrated than that in the distal small bowel. No free air.  No free fluid identified in the abdomen. Ventral abdominal wall postoperative changes but no abdominal hernia identified. Probable subcutaneous multiple nodular injection sites which are  new since 10/05/2019. Vascular/Lymphatic: Major arterial structures are patent. Mild Aortoiliac calcified atherosclerosis. portal venous system is patent. No lymphadenopathy. Reproductive: Negative. Other: Small volume of simple appearing pelvic free fluid on series 2, image 63. Musculoskeletal: No acute osseous abnormality identified. IMPRESSION: 1. Dilated small bowel is compatible with ongoing small-bowel obstruction, and a relative transition point is identified in an area of mesenteric swirling on coronal image 38, series 2, image 48. However, distal to that area there is a more gradual transition to more completely decompressed small bowel which continues to the terminal ileum. Top considerations are internal hernia versus adhesions in this clinical setting. 2. There is oral contrast throughout the large bowel to the rectum, but is probably that which was administered late last month. 3. No free air. Small volume of simple appearing free fluid in the pelvis. 4. Sequelae of appendectomy. Electronically Signed   By: Genevie Ann M.D.   On: 10/26/2019 15:16    Anti-infectives: Anti-infectives (From admission, onward)   None       Assessment/Plan HLD Blood products refusal AKI - resolved, Cr 0.54 Anemia - Hgb 9.9, monitor Abnormal EKG - appreciate cardiology consult, planning to repeat EKG this AM and likely follow up outpatient. consider adding Aspirin 81mg  daily for secondary prevention   Acute appendicitisS/p laparoscopic appendectomy4/20 Dr. Marlou Starks -POD#22 - path:Low-grade appendiceal mucinous neoplasm (LAMN)>> oncology to see - readmitted 5/9 with concern for SBO - CT 5/10 reviewed with MD, mild swirling mesentery but there is no evidence of any transition point, perforation, abscess; contrast in colon  ID -none FEN -decrease IVF@50cc /hr, clamp NG tube, FLD, Boost VTE -SCDs, lovenox Foley -none Follow up -TBD  Plan- Advance to full liquids and add Boost. Patient nervous  about taking NG tube out, will keep clamped for now but we discussed aspiration risks with keeping it in place and taking PO. Will recheck later today for possible removal. Replace K and Mag. Continue mobilizing.    LOS: 3 days    [facility] 10/28/2019, 9:33 AM Please see Amion for pager number during day hours 7:00am-4:30pm

## 2019-10-28 NOTE — Progress Notes (Signed)
Echo attempted at 2:15, docs pulling NG tube. Will come back at a later time. [place]

## 2019-10-28 NOTE — Care Management Important Message (Signed)
Important Message  Patient Details IM Letter given to [place] Case Manager to present to the Patient Name: Kelly Harrington MRN: [id number] Date of Birth: Feb 15, 1946   Medicare Important Message Given:  Yes     Kerin Salen 10/28/2019, 11:03 AM

## 2019-10-28 NOTE — Telephone Encounter (Signed)
I received a staff msg to cancel Kelly Harrington' scheduled appt w/Lacie for 5/20 at 1:45pm. Dr. Burr Medico saw the pt in the hospital and states there isn't a need for a f/u at this time.

## 2019-10-28 NOTE — Progress Notes (Addendum)
Progress Note  Patient Name: Kelly Harrington Date of Encounter: 10/28/2019  Primary Cardiologist: Elouise Munroe, MD   Subjective   No acute overnight events. No chest pain, shortness of breath, palpitations, lightheadedness, dizziness. Having soft bowel movements. NG tube may be able to be removed today.  Inpatient Medications    Scheduled Meds:  enoxaparin (LOVENOX) injection  40 mg Subcutaneous Q24H   feeding supplement  1 Container Oral BID BM   potassium chloride  40 mEq Oral BID   Continuous Infusions:  0.9 % NaCl with KCl 20 mEq / L 100 mL/hr at 10/28/19 0037   magnesium sulfate bolus IVPB     PRN Meds: fentaNYL (SUBLIMAZE) injection, metoprolol tartrate, ondansetron **OR** ondansetron (ZOFRAN) IV   Vital Signs    Vitals:   10/27/19 0526 10/27/19 1339 10/27/19 2157 10/28/19 0605  BP: (!) 145/82 126/87 (!) 135/92 (!) 141/92  Pulse: 95 88 92 86  Resp: 16 17 17 15   Temp: 98.1 F (36.7 C) 98.8 F (37.1 C) 98.3 F (36.8 C) 98.5 F (36.9 C)  TempSrc: Oral Oral    SpO2: 98% 98% 100% 99%  Weight:      Height:        Intake/Output Summary (Last 24 hours) at 10/28/2019 0953 Last data filed at 10/28/2019 [id number] Gross per 24 hour  Intake 2923.68 ml  Output 200 ml  Net 2723.68 ml   Last 3 Weights 10/25/2019 10/12/2019 10/09/2019  Weight (lbs) 120 lb 6.3 oz 120 lb 6.4 oz 126 lb 1.7 oz  Weight (kg) 54.61 kg 54.613 kg 57.2 kg      Telemetry    Patient not on telemetry.  ECG    Repeat EKG yesterday showed sinus arrhythmia, rate 88 bpm, with LVH and T wave inversion in inferior leads and biphasic T waves in V3-V6. Left axis deviation. Normal PR and QRS intervals. QTc automatically read as 462 ms but looks close to 400 ms on my read. - Personally Reviewed  Physical Exam   GEN: No acute distress.   Neck: Supple. Cardiac: RRR. No murmurs, rubs, or gallops. Radial pulses 2+ and equal bilaterally. Respiratory: Clear to auscultation bilaterally. GI: Soft,  non-distended, and non-tender. MS: No edema. No deformity. Skin: Warm and dry. Neuro:  No focal deficits. Psych: Normal affect.  Labs    High Sensitivity Troponin:  No results for input(s): TROPONINIHS in the last 720 hours.    Chemistry Recent Labs  Lab 10/24/19 2258 10/25/19 0422 10/26/19 0731 10/27/19 0238 10/28/19 0302  NA 132*   < > 141 139 135  K 3.4*   < > 3.5 3.5 3.2*  CL 88*   < > 105 105 104  CO2 26   < > 22 24 23   GLUCOSE 106*   < > 87 68* 79  BUN 55*   < > 39* 21 9  CREATININE 1.52*   < > 0.99 0.64 0.54  CALCIUM 10.7*   < > 9.0 8.6* 8.3*  PROT 9.3*  --   --   --   --   ALBUMIN 5.1*  --   --   --   --   AST 29  --   --   --   --   ALT 35  --   --   --   --   ALKPHOS 81  --   --   --   --   BILITOT 2.1*  --   --   --   --  GFRNONAA 34*   < > 57* >60 >60  GFRAA 40*   < > >60 >60 >60  ANIONGAP 18*   < > 14 10 8    < > = values in this interval not displayed.     Hematology Recent Labs  Lab 10/26/19 0731 10/27/19 0238 10/28/19 0302  WBC 8.9 6.7 5.0  RBC 3.84* 3.46* 3.33*  HGB 11.4* 10.3* 9.9*  HCT 35.9* 32.7* 31.2*  MCV 93.5 94.5 93.7  MCH 29.7 29.8 29.7  MCHC 31.8 31.5 31.7  RDW 12.9 12.8 12.5  PLT 467* 404* 388    BNPNo results for input(s): BNP, PROBNP in the last 168 hours.   DDimer No results for input(s): DDIMER in the last 168 hours.   Radiology    CT ABDOMEN PELVIS W CONTRAST  Result Date: 10/26/2019 CLINICAL DATA:  74 year old female with nausea, appendectomy last month, with subsequent small bowel obstruction. EXAM: CT ABDOMEN AND PELVIS WITH CONTRAST TECHNIQUE: Multidetector CT imaging of the abdomen and pelvis was performed using the standard protocol following bolus administration of intravenous contrast. CONTRAST:  10mL OMNIPAQUE IOHEXOL 300 MG/ML  SOLN COMPARISON:  CT Abdomen and Pelvis 10/09/2019 and earlier. FINDINGS: Lower chest: Negative aside from evidence of calcified coronary artery atherosclerosis, mild right lower lobe  atelectasis. Hepatobiliary: Negative liver and gallbladder. Pancreas: Negative. Spleen: Negative. Adrenals/Urinary Tract: Stable and negative, with homogeneous contrast enhancement and excretion from the kidneys. Proximal ureters are within normal limits. Unremarkable urinary bladder. Stomach/Bowel: Enteric tube courses through the lower esophagus terminating in the distal gastric body. Oral contrast has reached the rectum, although some contrast was administered late last month. Although there are dilated air and contrast containing small bowel loops up to 39 mm diameter. The most abrupt small-bowel transition identified is in the mid abdomen deep to the umbilicus where there is some regional swirling in the mesentery. See series 2, image 48 and coronal image 38. however, distal to this there is a somewhat gradual transition to more fully decompressed small bowel loops which continue to the terminal ileum. Surgical clips at the site of appendectomy again noted. Large bowel caliber is within normal limits. The large bowel oral contrast is more concentrated than that in the distal small bowel. No free air.  No free fluid identified in the abdomen. Ventral abdominal wall postoperative changes but no abdominal hernia identified. Probable subcutaneous multiple nodular injection sites which are new since 10/05/2019. Vascular/Lymphatic: Major arterial structures are patent. Mild Aortoiliac calcified atherosclerosis. portal venous system is patent. No lymphadenopathy. Reproductive: Negative. Other: Small volume of simple appearing pelvic free fluid on series 2, image 63. Musculoskeletal: No acute osseous abnormality identified. IMPRESSION: 1. Dilated small bowel is compatible with ongoing small-bowel obstruction, and a relative transition point is identified in an area of mesenteric swirling on coronal image 38, series 2, image 48. However, distal to that area there is a more gradual transition to more completely  decompressed small bowel which continues to the terminal ileum. Top considerations are internal hernia versus adhesions in this clinical setting. 2. There is oral contrast throughout the large bowel to the rectum, but is probably that which was administered late last month. 3. No free air. Small volume of simple appearing free fluid in the pelvis. 4. Sequelae of appendectomy. Electronically Signed   By: Genevie Ann M.D.   On: 10/26/2019 15:16    Cardiac Studies   None  Patient Profile     Dyamon Kaczmarek is a 74 y.o. female with  a history of hypertension and recent appendectomy complicated by small bowel obstruction who is being seen today for the evaluation of abnormal EKG at the request of Dr. Johney Maine.   Assessment & Plan    Abnormal EKG - Patient admitted with concerns for bowel obstruction. Noted to have irregular rhythm on exam today so EKG was ordered and was abnormal.  - Initial EKG automatically read as accelerated junction rhythm but I think it is sinus rhythm. Also has LVH and mild T wave inversion in inferior leads and lead V6 but there is some underlying artifact.  - Repeat EKG (not currently uploaded into electronic chart but in paper chart) shows sinus arrhythmia, rate 88 bpm, with LVH and T wave inversion in inferior leads and biphasic T waves in V3-V6. Left axis deviation. Normal PR and QRS intervals. QTc automatically read as 462 ms but looks close to 400 ms on my read.Repeat EKG showed  - No cardiac symptoms. Patient is very active and walks 1 hour 3-4 times per week.  - Looks like there may be some coronary calcification on CT so would consider home Lipitor when able to take PO. Can consider Zetia or PCSK9 inhibitor as outpatient if needed. Could also consider adding Aspirin 81mg  daily for secondary prevention when able to take PO. - Will discuss with MD about whether patient should have Echo inpatient vs. outpatient. Can consider further ischemic evaluation given coronary  calcification and T wave inversions as an outpatient.  Hypokalemia - Potassium 3.2 today. Being repleted by primary team. - Magnesium 1.7 today. Being repleted by primary team. - Continue to monitor.   Elevated BP - BP mildly elevated at times. - Patient not on any antihypertensive at home and manages borderline hypertension with diet and exercise. - Continue to monitor. Will not add anything at this time.  Hyperlipidemia - Patient on Lipitor 40mg  on M/W/F at home (unable to tolerate on daily basis due to myalgias). Not been taking recently due to bowel obstruction vs ileus.  - Restart when able.  Otherwise, per primary team.   For questions or updates, please contact [place] Please consult [email or web address] for contact info under        Signed, Darreld Mclean, PA-C  10/28/2019, 9:53 AM    Patient seen and examined with C.  Drucilla Schmidt.  Agree as above, with the following exceptions and changes as noted below.  She is feeling well today and continues to deny chest pain or shortness of breath. Gen: NAD, CV: RRR, no murmurs, Lungs: clear, Abd: soft, Extrem: Warm, well perfused, no edema, Neuro/Psych: alert and oriented x 3, normal mood and affect. All available labs, radiology testing, previous records reviewed.  She has evidence of LVH on ECG as well as T wave abnormality.  Would recommend obtaining an echocardiogram while in hospital to evaluate for any evidence of regional wall motion abnormalities and degree of LVH.  We will follow up and see the patient tomorrow.   Elouise Munroe, MD 10/28/19 3:52 PM

## 2019-10-28 NOTE — Plan of Care (Signed)
?  Problem: Health Behavior/Discharge Planning: ?Goal: Ability to manage health-related needs will improve ?Outcome: Progressing ?  ?Problem: Clinical Measurements: ?Goal: Ability to maintain clinical measurements within normal limits will improve ?Outcome: Progressing ?Goal: Will remain free from infection ?Outcome: Progressing ?Goal: Diagnostic test results will improve ?Outcome: Progressing ?  ?Problem: Activity: ?Goal: Risk for activity intolerance will decrease ?Outcome: Progressing ?  ?

## 2019-10-28 NOTE — Plan of Care (Signed)
Plan of care reviewed and discussed with the patient. 

## 2019-10-28 NOTE — Progress Notes (Signed)
Echocardiogram 2D Echocardiogram has been performed.  [place] Deputy Tamaj Jurgens 10/28/2019, 3:21 PM

## 2019-10-29 LAB — LIPID PANEL
Cholesterol: 179 mg/dL (ref 0–200)
HDL: 35 mg/dL — ABNORMAL LOW (ref >40)
LDL Cholesterol: 112 mg/dL — ABNORMAL HIGH (ref 0–99)
Total CHOL/HDL Ratio: 5.1 RATIO
Triglycerides: 158 mg/dL — ABNORMAL HIGH (ref <150)
VLDL: 32 mg/dL (ref 0–40)

## 2019-10-29 LAB — BASIC METABOLIC PANEL
Anion gap: 5 (ref 5–15)
BUN: 6 mg/dL — ABNORMAL LOW (ref 8–23)
CO2: 22 mmol/L (ref 22–32)
Calcium: 8.4 mg/dL — ABNORMAL LOW (ref 8.9–10.3)
Chloride: 106 mmol/L (ref 98–111)
Creatinine, Ser: 0.47 mg/dL (ref 0.44–1.00)
GFR calc Af Amer: >60 mL/min (ref >60)
GFR calc non Af Amer: >60 mL/min (ref >60)
Glucose, Bld: 101 mg/dL — ABNORMAL HIGH (ref 70–99)
Potassium: 4 mmol/L (ref 3.5–5.1)
Sodium: 133 mmol/L — ABNORMAL LOW (ref 135–145)

## 2019-10-29 LAB — CBC
HCT: 31.7 % — ABNORMAL LOW (ref 36.0–46.0)
Hemoglobin: 10 g/dL — ABNORMAL LOW (ref 12.0–15.0)
MCH: 29.1 pg (ref 26.0–34.0)
MCHC: 31.5 g/dL (ref 30.0–36.0)
MCV: 92.2 fL (ref 80.0–100.0)
Platelets: 400 10*3/uL (ref 150–400)
RBC: 3.44 MIL/uL — ABNORMAL LOW (ref 3.87–5.11)
RDW: 12.4 % (ref 11.5–15.5)
WBC: 4.1 10*3/uL (ref 4.0–10.5)
nRBC: 0 % (ref 0.0–0.2)

## 2019-10-29 LAB — MAGNESIUM: Magnesium: 1.7 mg/dL (ref 1.7–2.4)

## 2019-10-29 LAB — HEMOGLOBIN A1C
Hgb A1c MFr Bld: 5.3 % (ref 4.8–5.6)
Mean Plasma Glucose: 105.41 mg/dL

## 2019-10-29 MED ORDER — MAGNESIUM SULFATE 2 GM/50ML IV SOLN
2.0000 g | Freq: Once | INTRAVENOUS | Status: AC
  Administered 2019-10-29: 2 g via INTRAVENOUS
  Filled 2019-10-29: qty 50

## 2019-10-29 MED ORDER — MAGNESIUM HYDROXIDE 400 MG/5ML PO SUSP: 15.0000 mL | Freq: Every day | ORAL | Status: DC | PRN

## 2019-10-29 NOTE — Progress Notes (Addendum)
Progress Note  Patient Name: Kelly Harrington Date of Encounter: 10/29/2019  Primary Cardiologist: Elouise Munroe, MD   Subjective   Feeling good this morning. Happy with her SBO progress. No complaints of chest pain or SOB. She is active at home. She reports she has been off her lipitor for the past couple months since she has been sick. She would like to trial back on this medication before considering addition of zetia.   Inpatient Medications    Scheduled Meds:  enoxaparin (LOVENOX) injection  40 mg Subcutaneous Q24H   feeding supplement  1 Container Oral BID BM   Continuous Infusions:  0.9 % NaCl with KCl 20 mEq / L 50 mL/hr at 10/28/19 2200   PRN Meds: fentaNYL (SUBLIMAZE) injection, magnesium hydroxide, menthol-cetylpyridinium, metoprolol tartrate, ondansetron **OR** ondansetron (ZOFRAN) IV   Vital Signs    Vitals:   10/28/19 0605 10/28/19 1414 10/28/19 2059 10/29/19 0521  BP: (!) 141/92 124/80 127/79 (!) 123/94  Pulse: 86 94 87 85  Resp: 15 16 15 16   Temp: 98.5 F (36.9 C) 98.6 F (37 C) 98.4 F (36.9 C) 98.3 F (36.8 C)  TempSrc:  Oral Oral Oral  SpO2: 99% 99% 100% 100%  Weight:      Height:        Intake/Output Summary (Last 24 hours) at 10/29/2019 0842 Last data filed at 10/29/2019 [id number] Gross per 24 hour  Intake 2247.04 ml  Output --  Net 2247.04 ml   Filed Weights   10/25/19 0300  Weight: 54.6 kg    Telemetry    N/A - Personally Reviewed  ECG    No new tracings - Personally Reviewed  Physical Exam   GEN: No acute distress.   Neck: No JVD, no carotid bruits Cardiac: RRR, no murmurs, rubs, or gallops.  Respiratory: Clear to auscultation bilaterally, no wheezes/ rales/ rhonchi GI: NABS, Soft, nontender, non-distended  MS: No edema; No deformity. Neuro:  Nonfocal, moving all extremities spontaneously Psych: Normal affect   Labs    Chemistry Recent Labs  Lab 10/24/19 2258 10/25/19 0422 10/27/19 0238 10/28/19 0302  10/29/19 0307  NA 132*   < > 139 135 133*  K 3.4*   < > 3.5 3.2* 4.0  CL 88*   < > 105 104 106  CO2 26   < > 24 23 22   GLUCOSE 106*   < > 68* 79 101*  BUN 55*   < > 21 9 6*  CREATININE 1.52*   < > 0.64 0.54 0.47  CALCIUM 10.7*   < > 8.6* 8.3* 8.4*  PROT 9.3*  --   --   --   --   ALBUMIN 5.1*  --   --   --   --   AST 29  --   --   --   --   ALT 35  --   --   --   --   ALKPHOS 81  --   --   --   --   BILITOT 2.1*  --   --   --   --   GFRNONAA 34*   < > >60 >60 >60  GFRAA 40*   < > >60 >60 >60  ANIONGAP 18*   < > 10 8 5    < > = values in this interval not displayed.     Hematology Recent Labs  Lab 10/27/19 0238 10/28/19 0302 10/29/19 0307  WBC 6.7 5.0 4.1  RBC 3.46* 3.33*  3.44*  HGB 10.3* 9.9* 10.0*  HCT 32.7* 31.2* 31.7*  MCV 94.5 93.7 92.2  MCH 29.8 29.7 29.1  MCHC 31.5 31.7 31.5  RDW 12.8 12.5 12.4  PLT 404* 388 400    Cardiac EnzymesNo results for input(s): TROPONINI in the last 168 hours. No results for input(s): TROPIPOC in the last 168 hours.   BNPNo results for input(s): BNP, PROBNP in the last 168 hours.   DDimer No results for input(s): DDIMER in the last 168 hours.   Radiology    ECHOCARDIOGRAM COMPLETE  Result Date: 10/28/2019    ECHOCARDIOGRAM REPORT   Patient Name:   Kelly Harrington Date of Exam: 10/28/2019 Medical Rec #:  [id number]            Height:       61.0 in Accession #:    [id number]           Weight:       120.4 lb Date of Birth:  1946-01-26            BSA:          1.522 m Patient Age:    74 years             BP:           141/92 mmHg Patient Gender: F                    HR:           90 bpm. Exam Location:  Inpatient Procedure: 2D Echo, Color Doppler and Cardiac Doppler Indications:    R94.31 Abnormal EKG  History:        Patient has no prior history of Echocardiogram examinations.                 Risk Factors:Dyslipidemia.  Sonographer:    Raquel Sarna Senior RDCS Referring Phys: [id number] Darreld Mclean  Sonographer Comments: Technically  difficult study due to poor echo windows. Apical window acquired off axis due to interference. IMPRESSIONS  1. Left ventricular ejection fraction, by estimation, is 70 to 75%. The left ventricle has hyperdynamic function. The left ventricle has no regional wall motion abnormalities. There is mild concentric left ventricular hypertrophy. Left ventricular diastolic parameters are consistent with Grade I diastolic dysfunction (impaired relaxation).  2. Right ventricular systolic function is normal. The right ventricular size is normal.  3. The mitral valve is normal in structure. No evidence of mitral valve regurgitation. No evidence of mitral stenosis.  4. The aortic valve is normal in structure. Aortic valve regurgitation is not visualized. No aortic stenosis is present.  5. The inferior vena cava is normal in size with greater than 50% respiratory variability, suggesting right atrial pressure of 3 mmHg. FINDINGS  Left Ventricle: Left ventricular ejection fraction, by estimation, is 70 to 75%. The left ventricle has hyperdynamic function. The left ventricle has no regional wall motion abnormalities. The left ventricular internal cavity size was normal in size. There is mild concentric left ventricular hypertrophy. Left ventricular diastolic parameters are consistent with Grade I diastolic dysfunction (impaired relaxation). Normal left ventricular filling pressure. Right Ventricle: The right ventricular size is normal. No increase in right ventricular wall thickness. Right ventricular systolic function is normal. Left Atrium: Left atrial size was normal in size. Right Atrium: Right atrial size was normal in size. Pericardium: There is no evidence of pericardial effusion. Mitral Valve: The mitral valve is normal in structure. Normal mobility of the mitral valve  leaflets. No evidence of mitral valve regurgitation. No evidence of mitral valve stenosis. Tricuspid Valve: The tricuspid valve is normal in structure.  Tricuspid valve regurgitation is not demonstrated. No evidence of tricuspid stenosis. Aortic Valve: The aortic valve is normal in structure. Aortic valve regurgitation is not visualized. No aortic stenosis is present. Pulmonic Valve: The pulmonic valve was normal in structure. Pulmonic valve regurgitation is not visualized. No evidence of pulmonic stenosis. Aorta: The aortic root is normal in size and structure. Venous: The inferior vena cava is normal in size with greater than 50% respiratory variability, suggesting right atrial pressure of 3 mmHg. IAS/Shunts: No atrial level shunt detected by color flow Doppler.  LEFT VENTRICLE PLAX 2D LVIDd:         3.00 cm  Diastology LVIDs:         1.70 cm  LV e' lateral:   11.60 cm/s LV PW:         1.00 cm  LV E/e' lateral: 4.9 LV IVS:        1.10 cm  LV e' medial:    5.55 cm/s LVOT diam:     1.90 cm  LV E/e' medial:  10.3 LV SV:         53 LV SV Index:   35 LVOT Area:     2.84 cm  RIGHT VENTRICLE RV S prime:     12.70 cm/s LEFT ATRIUM             Index       RIGHT ATRIUM           Index LA diam:        2.80 cm 1.84 cm/m  RA Area:     10.90 cm LA Vol (A2C):   34.3 ml 22.53 ml/m RA Volume:   24.10 ml  15.83 ml/m LA Vol (A4C):   28.2 ml 18.52 ml/m LA Biplane Vol: 32.1 ml 21.09 ml/m  AORTIC VALVE LVOT Vmax:   106.00 cm/s LVOT Vmean:  81.100 cm/s LVOT VTI:    0.187 m  AORTA Ao Root diam: 2.40 cm Ao Asc diam:  2.50 cm MITRAL VALVE MV Area (PHT): 5.02 cm    SHUNTS MV Decel Time: 151 msec    Systemic VTI:  0.19 m MV E velocity: 57.00 cm/s  Systemic Diam: 1.90 cm MV A velocity: 99.40 cm/s MV E/A ratio:  0.57 Ena Dawley MD Electronically signed by Ena Dawley MD Signature Date/Time: 10/28/2019/4:03:28 PM    Final     Cardiac Studies   Echocardiogram 10/28/19: 1. Left ventricular ejection fraction, by estimation, is 70 to 75%. The  left ventricle has hyperdynamic function. The left ventricle has no  regional wall motion abnormalities. There is mild concentric left   ventricular hypertrophy. Left ventricular  diastolic parameters are consistent with Grade I diastolic dysfunction  (impaired relaxation).  2. Right ventricular systolic function is normal. The right ventricular  size is normal.  3. The mitral valve is normal in structure. No evidence of mitral valve  regurgitation. No evidence of mitral stenosis.  4. The aortic valve is normal in structure. Aortic valve regurgitation is  not visualized. No aortic stenosis is present.  5. The inferior vena cava is normal in size with greater than 50%  respiratory variability, suggesting right atrial pressure of 3 mmHg.   Patient Profile     74 y.o. female with PMH of HTN and recent appendectomy c/b SBO, who is being followed by cardiology for an abnormal EKG.  Assessment & Plan    1. Abnormal EKG: patient had evidence of LVH and T wave abnormalities in anterior/inferior leads. Prior to admission she was quite active and denied anginal complaints. Echo yesterday showed EF 70-75%, G1DD, mild LVH, no RWMA, and no significant valvular abnormalities. She was noted to have coronary artery calcifications on CT. Risk factors for CAD include HTN and HLD, as well as family history of CAD in mom/dad. - Consider an outpatient coronary CTA - Will add on a FLP and A1C to labs this morning for risk stratification - Favor starting aspirin 81mg  daily when cleared to do so by her primary team - Continue aggressive risk factor modifications below  2. HTN: BP intermittently elevated. Not on antihypertensive medications at home; managed with diet and exercise. Goal to maintain BP <130/80. - Continue to monitor  3. HLD: No lipids on file. She is unable to tolerate daily lipitor dosing due to myalgias, though has been able to take 40mg  M/W/F.  - Will add on FLP to labs this morning for risk stratification - Resume lipitor M/W/F - Consider addition of zetia if LDL >70 on next check  4. Electrolyte imbalance: hypokalemia  resolved with repletion. Mg continues to be low - Will replete Mg 2g IV today  5. S/p appendectomy c/b SBO: tolerating a liquid diet with plans to progress to a soft diet later day. Anticipating discharge as early as tomorrow. - Continue management per primary team.   Outpatient follow-up arranged with Dr. Margaretann Loveless 11/17/19. AVS updated.   For questions or updates, please contact [place] Please consult [email or web address] for contact info under Cardiology/STEMI.      Signed, Abigail Butts, PA-C  10/29/2019, 8:42 AM   [phone]  Patient seen and examined with Roby Lofts, PA-C.  Agree as above, with the following exceptions and changes as noted below.  She is feeling well, and NG tube has been removed.  She will try soft foods trial later today. Gen: NAD, CV: RRR, no murmurs, Lungs: clear, Abd: soft, Extrem: Warm, well perfused, no edema, Neuro/Psych: alert and oriented x 3, normal mood and affect. All available labs, radiology testing, previous records reviewed.  We discussed secondary prevention of CAD given coronary artery calcifications.  Echocardiogram largely unremarkable aside from hyperdynamic EF and mild LVH.  We discussed medical management of CAD.  No indications for inpatient stress testing as the patient is asymptomatic from a cardiopulmonary standpoint.  Follow-up has been arranged in my clinic on June 1 where we discuss management of coronary artery calcifications further.  Elouise Munroe, MD 10/29/19 1:44 PM

## 2019-10-29 NOTE — Plan of Care (Signed)
Care plan reviewed and discussed with the patient. 

## 2019-10-29 NOTE — Progress Notes (Signed)
[facility] Progress Note     Subjective: CC-  Feeling well. Tolerating full liquids. Denies nausea, vomiting, abdominal bloating. Continues to pass flatus and had another loose BM this morning. Ambulated >1 hour yesterday.  Objective: Vital signs in last 24 hours: Temp:  [98.3 F (36.8 C)-98.6 F (37 C)] 98.3 F (36.8 C) (05/13 0521) Pulse Rate:  [85-94] 85 (05/13 0521) Resp:  [15-16] 16 (05/13 0521) BP: (123-127)/(79-94) 123/94 (05/13 0521) SpO2:  [99 %-100 %] 100 % (05/13 0521) Last BM Date: 10/28/19  Intake/Output from previous day: 05/12 0701 - 05/13 0700 In: 1879.6 [P.O.:850; I.V.:1029.6] Out: -  Intake/Output this shift: No intake/output data recorded.  PE: Gen: Alert, NAD, pleasant Cardio: RRR Pulm: CTAB, rate and effort normal Abd: Soft, NT/ND,+BS,lapincisions C/D/I  Lab Results:  Recent Labs    10/28/19 0302 10/29/19 0307  WBC 5.0 4.1  HGB 9.9* 10.0*  HCT 31.2* 31.7*  PLT 388 400   BMET Recent Labs    10/28/19 0302 10/29/19 0307  NA 135 133*  K 3.2* 4.0  CL 104 106  CO2 23 22  GLUCOSE 79 101*  BUN 9 6*  CREATININE 0.54 0.47  CALCIUM 8.3* 8.4*   PT/INR No results for input(s): LABPROT, INR in the last 72 hours. CMP     Component Value Date/Time   NA 133 (L) 10/29/2019 0307   K 4.0 10/29/2019 0307   CL 106 10/29/2019 0307   CO2 22 10/29/2019 0307   GLUCOSE 101 (H) 10/29/2019 0307   BUN 6 (L) 10/29/2019 0307   CREATININE 0.47 10/29/2019 0307   CALCIUM 8.4 (L) 10/29/2019 0307   PROT 9.3 (H) 10/24/2019 2258   ALBUMIN 5.1 (H) 10/24/2019 2258   AST 29 10/24/2019 2258   ALT 35 10/24/2019 2258   ALKPHOS 81 10/24/2019 2258   BILITOT 2.1 (H) 10/24/2019 2258   GFRNONAA >60 10/29/2019 0307   GFRAA >60 10/29/2019 0307   Lipase     Component Value Date/Time   LIPASE 42 10/24/2019 2258       Studies/Results: ECHOCARDIOGRAM COMPLETE  Result Date: 10/28/2019    ECHOCARDIOGRAM REPORT   Patient Name:   Asanti JEAN  Mangel Date of Exam: 10/28/2019 Medical Rec #:  [id number]            Height:       61.0 in Accession #:    [id number]           Weight:       120.4 lb Date of Birth:  1945/10/27            BSA:          1.522 m Patient Age:    55 years             BP:           141/92 mmHg Patient Gender: F                    HR:           90 bpm. Exam Location:  Inpatient Procedure: 2D Echo, Color Doppler and Cardiac Doppler Indications:    R94.31 Abnormal EKG  History:        Patient has no prior history of Echocardiogram examinations.                 Risk Factors:Dyslipidemia.  Sonographer:    Raquel Sarna Senior RDCS Referring Phys: [id number] Darreld Mclean  Sonographer Comments: Technically difficult study due to  poor echo windows. Apical window acquired off axis due to interference. IMPRESSIONS  1. Left ventricular ejection fraction, by estimation, is 70 to 75%. The left ventricle has hyperdynamic function. The left ventricle has no regional wall motion abnormalities. There is mild concentric left ventricular hypertrophy. Left ventricular diastolic parameters are consistent with Grade I diastolic dysfunction (impaired relaxation).  2. Right ventricular systolic function is normal. The right ventricular size is normal.  3. The mitral valve is normal in structure. No evidence of mitral valve regurgitation. No evidence of mitral stenosis.  4. The aortic valve is normal in structure. Aortic valve regurgitation is not visualized. No aortic stenosis is present.  5. The inferior vena cava is normal in size with greater than 50% respiratory variability, suggesting right atrial pressure of 3 mmHg. FINDINGS  Left Ventricle: Left ventricular ejection fraction, by estimation, is 70 to 75%. The left ventricle has hyperdynamic function. The left ventricle has no regional wall motion abnormalities. The left ventricular internal cavity size was normal in size. There is mild concentric left ventricular hypertrophy. Left ventricular diastolic  parameters are consistent with Grade I diastolic dysfunction (impaired relaxation). Normal left ventricular filling pressure. Right Ventricle: The right ventricular size is normal. No increase in right ventricular wall thickness. Right ventricular systolic function is normal. Left Atrium: Left atrial size was normal in size. Right Atrium: Right atrial size was normal in size. Pericardium: There is no evidence of pericardial effusion. Mitral Valve: The mitral valve is normal in structure. Normal mobility of the mitral valve leaflets. No evidence of mitral valve regurgitation. No evidence of mitral valve stenosis. Tricuspid Valve: The tricuspid valve is normal in structure. Tricuspid valve regurgitation is not demonstrated. No evidence of tricuspid stenosis. Aortic Valve: The aortic valve is normal in structure. Aortic valve regurgitation is not visualized. No aortic stenosis is present. Pulmonic Valve: The pulmonic valve was normal in structure. Pulmonic valve regurgitation is not visualized. No evidence of pulmonic stenosis. Aorta: The aortic root is normal in size and structure. Venous: The inferior vena cava is normal in size with greater than 50% respiratory variability, suggesting right atrial pressure of 3 mmHg. IAS/Shunts: No atrial level shunt detected by color flow Doppler.  LEFT VENTRICLE PLAX 2D LVIDd:         3.00 cm  Diastology LVIDs:         1.70 cm  LV e' lateral:   11.60 cm/s LV PW:         1.00 cm  LV E/e' lateral: 4.9 LV IVS:        1.10 cm  LV e' medial:    5.55 cm/s LVOT diam:     1.90 cm  LV E/e' medial:  10.3 LV SV:         53 LV SV Index:   35 LVOT Area:     2.84 cm  RIGHT VENTRICLE RV S prime:     12.70 cm/s LEFT ATRIUM             Index       RIGHT ATRIUM           Index LA diam:        2.80 cm 1.84 cm/m  RA Area:     10.90 cm LA Vol (A2C):   34.3 ml 22.53 ml/m RA Volume:   24.10 ml  15.83 ml/m LA Vol (A4C):   28.2 ml 18.52 ml/m LA Biplane Vol: 32.1 ml 21.09 ml/m  AORTIC VALVE LVOT  Vmax:  106.00 cm/s LVOT Vmean:  81.100 cm/s LVOT VTI:    0.187 m  AORTA Ao Root diam: 2.40 cm Ao Asc diam:  2.50 cm MITRAL VALVE MV Area (PHT): 5.02 cm    SHUNTS MV Decel Time: 151 msec    Systemic VTI:  0.19 m MV E velocity: 57.00 cm/s  Systemic Diam: 1.90 cm MV A velocity: 99.40 cm/s MV E/A ratio:  0.57 Ena Dawley MD Electronically signed by Ena Dawley MD Signature Date/Time: 10/28/2019/4:03:28 PM    Final     Anti-infectives: Anti-infectives (From admission, onward)   None       Assessment/Plan HLD Blood products refusal AKI - resolved, Cr 0.47 Anemia - Hgb 10, stable Abnormal EKG - appreciate cardiology consult, ECHO obtained yesterday - will await their recommendations  Acute appendicitisS/p laparoscopic appendectomy4/20 Dr. 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 Continue ambulating. If tolerating will plan to advance to soft diet later today, and possibly home as early as tomorrow if doing well.   LOS: 4 days    Wellington Hampshire, [facility] 10/29/2019, 8:21 AM Please see Amion for pager number during day hours 7:00am-4:30pm

## 2019-10-29 NOTE — Discharge Summary (Signed)
Physician Discharge Summary  Patient ID: Kelly Harrington MRN: [id number] DOB/AGE: 1945-07-24 74 y.o.  Admit date: 10/24/2019 Discharge date: 11/04/2019  Admission Diagnoses:  Recurrent SBO readmitted 10/09/19-10/19/19 and 10/25/19   Acute appendicitis with laparoscopic appendectomy 10/06/19 Dr. Marlou Starks Low grad appendiceal mucinous neoplasm - to follow up with oncology HLD AKI -  Anemia - Hgb10, stable Blood products refusal  Discharge Diagnoses:  Recurrent SBO readmitted 10/09/19-10/19/19 and 10/25/19   Acute appendicitis with laparoscopic appendectomy 10/06/19 Dr. Marlou Starks Low grad appendiceal mucinous neoplasm - to follow up with oncology HLD AKI - resolved Anemia - Hgb10, stable Blood products refusal Abnormal EKG   Active Problems:   SBO (small bowel obstruction) (HCC)   Appendiceal tumor  Consults:   Dr. Cherlynn Kaiser - cardiology  PROCEDURES: None  Hospital Course:  This is a 74 year old female who underwent a laparoscopic appendectomy for acute appendicitis on 4/20.  She was discharged home on 4/22 but then readmitted on 4/23 with a small bowel obstruction versus ileus.  She improved with conservative management and was discharged home on 5/3 tolerating a diet and having bowel movements.  She reports she initially was doing well but over the last several days has not had a bowel movement.  She took a suppository yesterday and did have some diarrhea but because of worsening distention presented to the emergency department.  She was found to be dehydrated and abdominal x-ray showed distended loops of small bowel consistent with an obstruction.  A nasogastric tube was placed and she has had some improvement in her distention but still has not passed flatus on day of readmission.    NG was placed again and she was placed on Small bowel protocol.  8 hr film showed persistent SBO with most of the contrast in the small bowel, none in the colon.  Repeat CT on 10/26/19:showed contrast in the  colon.  There was some swirling in the mesentery, but no evidence of transition, perforation or abscess.  On 5/11 she reported 2 loose stools , and passing gas. She was clamped and started on clear liquids 10/28/19.  She had had 3 NG's and was hesitant to remove her NG.  Diet was advanced.  NG was removed and diet advanced again on 10/29/19. By 11/02/19 she was tolerating a full liquid diet and having multiple BM's.  She was finally discharged on 11/04/19 with plans to maintain a full liquid diet for 4 weeks at home.  She was to follow up with Dr. Marlou Starks in 3-4 weeks.  She has an irregular rhythm and a EKG suggested an accelerated junctional rhythm. She was seen by Cardiology and and Echo cardiogram was done on 10/28/19.  Cardiology will follow as an outpatient and recommends starting ASA 81 mg.    PE: Abd: soft, NT, less distended, +BS  CBC Latest Ref Rng & Units 11/02/2019 11/01/2019 10/29/2019  WBC 4.0 - 10.5 K/uL 4.7 5.8 4.1  Hemoglobin 12.0 - 15.0 g/dL 9.7(L) 9.6(L) 10.0(L)  Hematocrit 36.0 - 46.0 % 31.4(L) 29.9(L) 31.7(L)  Platelets 150 - 400 K/uL 429(H) 430(H) 400   CMP Latest Ref Rng & Units 11/02/2019 11/01/2019 10/29/2019  Glucose 70 - 99 mg/dL 90 88 101(H)  BUN 8 - 23 mg/dL <5(L) 7(L) 6(L)  Creatinine 0.44 - 1.00 mg/dL 0.51 0.57 0.47  Sodium 135 - 145 mmol/L 140 140 133(L)  Potassium 3.5 - 5.1 mmol/L 3.6 3.5 4.0  Chloride 98 - 111 mmol/L 109 107 106  CO2 22 - 32 mmol/L 23  26 22  Calcium 8.9 - 10.3 mg/dL 8.7(L) 8.8(L) 8.4(L)  Total Protein 6.5 - 8.1 g/dL - - -  Total Bilirubin 0.3 - 1.2 mg/dL - - -  Alkaline Phos 38 - 126 U/L - - -  AST 15 - 41 U/L - - -  ALT 0 - 44 U/L - - -     Condition on DC:  Improved      Disposition:  Home     Allergies as of 11/04/2019      Reactions   Codeine Nausea And Vomiting      Medication List    TAKE these medications   acetaminophen 500 MG tablet Commonly known as: TYLENOL Take 500-1,000 mg by mouth every 6 (six) hours as needed for  moderate pain.   atorvastatin 40 MG tablet Commonly known as: LIPITOR Take 40 mg by mouth every Monday, Wednesday, and Friday. Take on MWF   Biotin 1 MG Caps Take 1 capsule by mouth daily.   cetirizine 10 MG tablet Commonly known as: ZYRTEC Take 10 mg by mouth daily as needed for allergies.   cholecalciferol 25 MCG (1000 UNIT) tablet Commonly known as: VITAMIN D3 Take 1,000 Units by mouth daily.   feeding supplement (ENSURE ENLIVE) Liqd Take 237 mLs by mouth 2 (two) times daily between meals.   multivitamin with minerals Tabs tablet Take 1 tablet by mouth daily.   polyethylene glycol 17 g packet Commonly known as: MIRALAX / GLYCOLAX Take 17 g by mouth 2 (two) times daily.      Follow-up Information    Elouise Munroe, MD Follow up on 11/17/2019.   Specialties: Cardiology, Radiology Why: Please arrive 15 minutes early for your 10:20am post-hospital cardiology follow-up appointment Contact information: [address] [place] [address] [phone]        Jovita Kussmaul, MD. Go on 11/24/2019.   Specialty: General Surgery Why: 6/8 arrive at 9:15am for 9:50am appointment.  please bring photo ID and insurance card with you Contact information: [place] [address] [phone]           Signed: Henreitta Cea 11/04/2019, 8:47 AM

## 2019-10-29 NOTE — Discharge Instructions (Signed)
Soft-Food Eating Plan A soft-food eating plan includes foods that are safe and easy to chew and swallow. Your health care provider or dietitian can help you find foods and flavors that fit into this plan. Follow this plan until your health care provider or dietitian says it is safe to start eating other foods and food textures. What are tips for following this plan? General guidelines   Take small bites of food, or cut food into pieces about  inch or smaller. Bite-sized pieces of food are easier to chew and swallow.  Eat moist foods. Avoid overly dry foods.  Avoid foods that: ? Are difficult to swallow, such as dry, chunky, crispy, or sticky foods. ? Are difficult to chew, such as hard, tough, or stringy foods. ? Contain nuts, seeds, or fruits.  Follow instructions from your dietitian about the types of liquids that are safe for you to swallow. You may be allowed to have: ? Thick liquids only. This includes only liquids that are thicker than honey. ? Thin and thick liquids. This includes all beverages and foods that become liquid at room temperature.  To make thick liquids: ? Purchase a commercial liquid thickening powder. These are available at grocery stores and pharmacies. ? Mix the thickener into liquids according to instructions on the label. ? Purchase ready-made thickened liquids. ? Thicken soup by pureeing, straining to remove chunks, and adding flour, potato flakes, or corn starch. ? Add commercial thickener to foods that become liquid at room temperature, such as milk shakes, yogurt, ice cream, gelatin, and sherbet.  Ask your health care provider whether you need to take a fiber supplement. Cooking  Cook meats so they stay tender and moist. Use methods like braising, stewing, or baking in liquid.  Cook vegetables and fruit until they are soft enough to be mashed with a fork.  Peel soft, fresh fruits such as peaches, nectarines, and melons.  When making soup, make sure  chunks of meat and vegetables are smaller than  inch.  Reheat leftover foods slowly so that a tough crust does not form. What foods are allowed? The items listed below may not be a complete list. Talk with your dietitian about what dietary choices are best for you. Grains Breads, muffins, pancakes, or waffles moistened with syrup, jelly, or butter. Dry cereals well-moistened with milk. Moist, cooked cereals. Well-cooked pasta and rice. Vegetables All soft-cooked vegetables. Shredded lettuce. Fruits All canned and cooked fruits. Soft, peeled fresh fruits. Strawberries. Dairy Milk. Cream. Yogurt. Cottage cheese. Soft cheese without the rind. Meats and other protein foods Tender, moist ground meat, poultry, or fish. Meat cooked in gravy or sauces. Eggs. Sweets and desserts Ice cream. Milk shakes. Sherbet. Pudding. Fats and oils Butter. Margarine. Olive, canola, sunflower, and grapeseed oil. Smooth salad dressing. Smooth cream cheese. Mayonnaise. Gravy. What foods are not allowed? The items listed bemay not be a complete list. Talk with your dietitian about what dietary choices are best for you. Grains Coarse or dry cereals, such as bran, granola, and shredded wheat. Tough or chewy crusty breads, such as French bread or baguettes. Breads with nuts, seeds, or fruit. Vegetables All raw vegetables. Cooked corn. Cooked vegetables that are tough or stringy. Tough, crisp, fried potatoes and potato skins. Fruits Fresh fruits with skins or seeds, or both, such as apples, pears, and grapes. Stringy, high-pulp fruits, such as papaya, pineapple, coconut, and mango. Fruit leather and all dried fruit. Dairy Yogurt with nuts or coconut. Meats and other protein foods Hard,   dry sausages. Dry meat, poultry, or fish. Meats with gristle. Fish with bones. Fried meat or fish. Lunch meat and hotdogs. Nuts and seeds. Chunky peanut butter or other nut butters. Sweets and desserts Cakes or cookies that are very  dry or chewy. Desserts with dried fruit, nuts, or coconut. Fried pastries. Very rich pastries. Fats and oils Cream cheese with fruit or nuts. Salad dressings with seeds or chunks. Summary  A soft-food eating plan includes foods that are safe and easy to swallow. Generally, the foods should be soft enough to be mashed with a fork.  Avoid foods that are dry, hard to chew, crunchy, sticky, stringy, or crispy.  Ask your health care provider whether you need to thicken your liquids and if you need to take a fiber supplement. This information is not intended to replace advice given to you by your health care provider. Make sure you discuss any questions you have with your health care provider. Document Revised: 09/25/2018 Document Reviewed: 08/07/2016 [employer] Patient Education  2020 [employer]  

## 2019-10-30 MED ORDER — POLYETHYLENE GLYCOL 3350 17 G PO PACK
17.0000 g | PACK | Freq: Two times a day (BID) | ORAL | Status: DC
  Administered 2019-10-30 – 2019-11-04 (×10): 17 g via ORAL
  Filled 2019-10-30 (×11): qty 1

## 2019-10-30 MED ORDER — SIMETHICONE 40 MG/0.6ML PO SUSP
40.0000 mg | Freq: Four times a day (QID) | ORAL | Status: AC
  Administered 2019-10-30: 40 mg via ORAL
  Filled 2019-10-30 (×2): qty 0.6

## 2019-10-30 MED ORDER — ASPIRIN EC 81 MG PO TBEC
81.0000 mg | DELAYED_RELEASE_TABLET | Freq: Every day | ORAL | Status: DC
  Administered 2019-10-30 – 2019-11-04 (×6): 81 mg via ORAL
  Filled 2019-10-30 (×6): qty 1

## 2019-10-30 NOTE — Progress Notes (Signed)
Kelly Harrington [id number] 08-11-1945  CARE TEAM:  PCP: Alroy Dust, L.Marlou Sa, MD  Outpatient Care Team: Patient Care Team: Hoback, Carlean Jews.Marlou Sa, MD as PCP - General (Family Medicine) Elouise Munroe, MD as PCP - Cardiology (Cardiology) Jonnie Finner, RN as Oncology Nurse Navigator Alla Feeling, NP as Nurse Practitioner (Nurse Practitioner) Truitt Merle, MD as Consulting Physician (Hematology)  Inpatient Treatment Team: Treatment Team: Attending Provider: Edison Pace, Md, MD; Nurse Practitioner: Maryanna Shape, NP; Consulting Physician: Truitt Merle, MD; Technician: Lowella Dandy, NT; Registered Nurse: Yvette Rack, RN; Registered Nurse: Oleta Mouse, RN; Registered Nurse: Sheilah Mins, RN; Utilization Review: Micah Noel, RN; Social Worker: Lia Hopping, LCSW   Problem List:   Active Problems:   SBO (small bowel obstruction) ([place])   Appendiceal tumor      * No surgery found *       [facility] Stay = 5 days)  Assessment/Plan HLD Blood products refusal AKI - resolved, Cr 0.47 Anemia - Hgb10, stable Abnormal EKG - appreciate cardiology consult, ECHO obtained yesterday - will await their recommendations  Acute appendicitisS/p laparoscopic appendectomy4/20 Dr. Marlou Starks -POD#24 - path:Low-grade appendiceal mucinous neoplasm (LAMN)>> oncology to see - readmitted 5/9 withconcern forSBO - CT 5/10 reviewed with MD,mild swirling mesentery but there is no evidence of any transition point, perforation, abscess; contrast in colon  ID -none FEN -IVF@ML ,, Boost VTE -SCDs, lovenox Foley -none Follow up -TBD  Plan-reduce down to dysphagia 1/blenderized diet.  Simethicone around-the-clock for bloating.  Gradually increase MiraLAX to help cleanout.  Follow-up IV fluids.  Fluid boluses as needed.  Hopefully home this weekend if more consistently improves.  Continue full liquid diet and Boost. Continue ambulating. If tolerating will plan  to advance to soft diet later today, and possibly home as early as tomorrow if doing well.   35 minutes spent in review, evaluation, examination, counseling, and coordination of care.  More than 50% of that time was spent in counseling.  10/30/2019    Subjective: (Chief complaint)  More bloated on soft diet.  Walking more.  No fevers.  Objective:  Vital signs:  Vitals:   10/29/19 0521 10/29/19 1402 10/29/19 2136 10/30/19 0438  BP: (!) 123/94 103/76 124/88 131/83  Pulse: 85 86 82 85  Resp: 16 18 16 16   Temp: 98.3 F (36.8 C) 98 F (36.7 C) 98.3 F (36.8 C) 98.2 F (36.8 C)  TempSrc: Oral Oral Oral Oral  SpO2: 100% 100% 99% 100%  Weight:      Height:        Last BM Date: 10/28/19  Intake/Output   Yesterday:  05/13 0701 - 05/14 0700 In: 1796.5 [P.O.:680; I.V.:1116.5] Out: -  This shift:  No intake/output data recorded.  Bowel function:  Flatus: YES  BM:  No  Drain: (No drain)   Physical Exam:  General: Pt awake/alert in no acute distress Eyes: PERRL, normal EOM.  Sclera clear.  No icterus Neuro: CN II-XII intact w/o focal sensory/motor deficits. Lymph: No head/neck/groin lymphadenopathy Psych:  No delerium/psychosis/paranoia.  Oriented x 4 HENT: Normocephalic, Mucus membranes moist.  No thrush Neck: Supple, No tracheal deviation.  No obvious thyromegaly Chest: No pain to chest wall compression.  Good respiratory excursion.  No audible wheezing CV:  Pulses intact.  Regular rhythm.  No major extremity edema MS: Normal AROM mjr joints.  No obvious deformity  Abdomen: Soft.  Moderately distended.  Nontender.  No evidence of peritonitis.  No incarcerated hernias.  Ext:   No  deformity.  No mjr edema.  No cyanosis Skin: No petechiae / purpurea.  No major sores.  Warm and dry    Results:   Cultures: Recent Results (from the past 720 hour(s))  Respiratory Panel by RT PCR (Flu A&B, Covid) - Nasopharyngeal Swab     Status: None   Collection Time:  10/05/19 11:56 PM   Specimen: Nasopharyngeal Swab  Result Value Ref Range Status   SARS Coronavirus 2 by RT PCR NEGATIVE NEGATIVE Final    Comment: (NOTE) SARS-CoV-2 target nucleic acids are NOT DETECTED. The SARS-CoV-2 RNA is generally detectable in upper respiratoy specimens during the acute phase of infection. The lowest concentration of SARS-CoV-2 viral copies this assay can detect is 131 copies/mL. A negative result does not preclude SARS-Cov-2 infection and should not be used as the sole basis for treatment or other patient management decisions. A negative result may occur with  improper specimen collection/handling, submission of specimen other than nasopharyngeal swab, presence of viral mutation(s) within the areas targeted by this assay, and inadequate number of viral copies (<131 copies/mL). A negative result must be combined with clinical observations, patient history, and epidemiological information. The expected result is Negative. Fact Sheet for Patients:  PinkCheek.be Fact Sheet for Healthcare Providers:  GravelBags.it This test is not yet ap proved or cleared by the [place] FDA and  has been authorized for detection and/or diagnosis of SARS-CoV-2 by FDA under an Emergency Use Authorization (EUA). This EUA will remain  in effect (meaning this test can be used) for the duration of the COVID-19 declaration under Section 564(b)(1) of the Act, 21 U.S.C. section 360bbb-3(b)(1), unless the authorization is terminated or revoked sooner.    Influenza A by PCR NEGATIVE NEGATIVE Final   Influenza B by PCR NEGATIVE NEGATIVE Final    Comment: (NOTE) The Xpert Xpress SARS-CoV-2/FLU/RSV assay is intended as an aid in  the diagnosis of influenza from Nasopharyngeal swab specimens and  should not be used as a sole basis for treatment. Nasal washings and  aspirates are unacceptable for Xpert Xpress SARS-CoV-2/FLU/RSV    testing. Fact Sheet for Patients: PinkCheek.be Fact Sheet for Healthcare Providers: GravelBags.it This test is not yet approved or cleared by the [place] FDA and  has been authorized for detection and/or diagnosis of SARS-CoV-2 by  FDA under an Emergency Use Authorization (EUA). This EUA will remain  in effect (meaning this test can be used) for the duration of the  Covid-19 declaration under Section 564(b)(1) of the Act, 21  U.S.C. section 360bbb-3(b)(1), unless the authorization is  terminated or revoked. Performed at [facility], [place] [address], [place], [place] [address]   Respiratory Panel by RT PCR (Flu A&B, Covid) - Nasopharyngeal Swab     Status: None   Collection Time: 10/09/19  9:59 PM   Specimen: Nasopharyngeal Swab  Result Value Ref Range Status   SARS Coronavirus 2 by RT PCR NEGATIVE NEGATIVE Final    Comment: (NOTE) SARS-CoV-2 target nucleic acids are NOT DETECTED. The SARS-CoV-2 RNA is generally detectable in upper respiratoy specimens during the acute phase of infection. The lowest concentration of SARS-CoV-2 viral copies this assay can detect is 131 copies/mL. A negative result does not preclude SARS-Cov-2 infection and should not be used as the sole basis for treatment or other patient management decisions. A negative result may occur with  improper specimen collection/handling, submission of specimen other than nasopharyngeal swab, presence of viral mutation(s) within the areas targeted by this assay, and  inadequate number of viral copies (<131 copies/mL). A negative result must be combined with clinical observations, patient history, and epidemiological information. The expected result is Negative. Fact Sheet for Patients:  PinkCheek.be Fact Sheet for Healthcare Providers:  GravelBags.it This test is not yet ap proved  or cleared by the [place] FDA and  has been authorized for detection and/or diagnosis of SARS-CoV-2 by FDA under an Emergency Use Authorization (EUA). This EUA will remain  in effect (meaning this test can be used) for the duration of the COVID-19 declaration under Section 564(b)(1) of the Act, 21 U.S.C. section 360bbb-3(b)(1), unless the authorization is terminated or revoked sooner.    Influenza A by PCR NEGATIVE NEGATIVE Final   Influenza B by PCR NEGATIVE NEGATIVE Final    Comment: (NOTE) The Xpert Xpress SARS-CoV-2/FLU/RSV assay is intended as an aid in  the diagnosis of influenza from Nasopharyngeal swab specimens and  should not be used as a sole basis for treatment. Nasal washings and  aspirates are unacceptable for Xpert Xpress SARS-CoV-2/FLU/RSV  testing. Fact Sheet for Patients: PinkCheek.be Fact Sheet for Healthcare Providers: GravelBags.it This test is not yet approved or cleared by the [place] FDA and  has been authorized for detection and/or diagnosis of SARS-CoV-2 by  FDA under an Emergency Use Authorization (EUA). This EUA will remain  in effect (meaning this test can be used) for the duration of the  Covid-19 declaration under Section 564(b)(1) of the Act, 21  U.S.C. section 360bbb-3(b)(1), unless the authorization is  terminated or revoked. Performed at [facility], [place] [address], [place], [place] [address]   Respiratory Panel by RT PCR (Flu A&B, Covid) - Nasopharyngeal Swab     Status: None   Collection Time: 10/25/19  1:09 AM   Specimen: Nasopharyngeal Swab  Result Value Ref Range Status   SARS Coronavirus 2 by RT PCR NEGATIVE NEGATIVE Final    Comment: (NOTE) SARS-CoV-2 target nucleic acids are NOT DETECTED. The SARS-CoV-2 RNA is generally detectable in upper respiratoy specimens during the acute phase of infection. The lowest concentration of SARS-CoV-2 viral copies  this assay can detect is 131 copies/mL. A negative result does not preclude SARS-Cov-2 infection and should not be used as the sole basis for treatment or other patient management decisions. A negative result may occur with  improper specimen collection/handling, submission of specimen other than nasopharyngeal swab, presence of viral mutation(s) within the areas targeted by this assay, and inadequate number of viral copies (<131 copies/mL). A negative result must be combined with clinical observations, patient history, and epidemiological information. The expected result is Negative. Fact Sheet for Patients:  PinkCheek.be Fact Sheet for Healthcare Providers:  GravelBags.it This test is not yet ap proved or cleared by the [place] FDA and  has been authorized for detection and/or diagnosis of SARS-CoV-2 by FDA under an Emergency Use Authorization (EUA). This EUA will remain  in effect (meaning this test can be used) for the duration of the COVID-19 declaration under Section 564(b)(1) of the Act, 21 U.S.C. section 360bbb-3(b)(1), unless the authorization is terminated or revoked sooner.    Influenza A by PCR NEGATIVE NEGATIVE Final   Influenza B by PCR NEGATIVE NEGATIVE Final    Comment: (NOTE) The Xpert Xpress SARS-CoV-2/FLU/RSV assay is intended as an aid in  the diagnosis of influenza from Nasopharyngeal swab specimens and  should not be used as a sole basis for treatment. Nasal washings and  aspirates are unacceptable for Xpert Xpress SARS-CoV-2/FLU/RSV  testing. Fact Sheet for Patients: PinkCheek.be Fact Sheet for Healthcare Providers: GravelBags.it This test is not yet approved or cleared by the [place] FDA and  has been authorized for detection and/or diagnosis of SARS-CoV-2 by  FDA under an Emergency Use Authorization (EUA). This EUA will remain  in  effect (meaning this test can be used) for the duration of the  Covid-19 declaration under Section 564(b)(1) of the Act, 21  U.S.C. section 360bbb-3(b)(1), unless the authorization is  terminated or revoked. Performed at [facility], [place] [address], [place], [place] [address]     Labs: Results for orders placed or performed during the hospital encounter of 10/24/19 (from the past 48 hour(s))  CBC     Status: Abnormal   Collection Time: 10/29/19  3:07 AM  Result Value Ref Range   WBC 4.1 4.0 - 10.5 K/uL   RBC 3.44 (L) 3.87 - 5.11 MIL/uL   Hemoglobin 10.0 (L) 12.0 - 15.0 g/dL   HCT 31.7 (L) 36.0 - 46.0 %   MCV 92.2 80.0 - 100.0 fL   MCH 29.1 26.0 - 34.0 pg   MCHC 31.5 30.0 - 36.0 g/dL   RDW 12.4 11.5 - 15.5 %   Platelets 400 150 - 400 K/uL   nRBC 0.0 0.0 - 0.2 %    Comment: Performed at [facility], [place] [address] [place], [place] [id number]  Basic metabolic panel     Status: Abnormal   Collection Time: 10/29/19  3:07 AM  Result Value Ref Range   Sodium 133 (L) 135 - 145 mmol/L   Potassium 4.0 3.5 - 5.1 mmol/L    Comment: DELTA CHECK NOTED   Chloride 106 98 - 111 mmol/L   CO2 22 22 - 32 mmol/L   Glucose, Bld 101 (H) 70 - 99 mg/dL    Comment: Glucose reference range applies only to samples taken after fasting for at least 8 hours.   BUN 6 (L) 8 - 23 mg/dL   Creatinine, Ser 0.47 0.44 - 1.00 mg/dL   Calcium 8.4 (L) 8.9 - 10.3 mg/dL   GFR calc non Af Amer >60 >60 mL/min   GFR calc Af Amer >60 >60 mL/min   Anion gap 5 5 - 15    Comment: Performed at [facility], [place] [address], [place], [place] [address]  Magnesium     Status: None   Collection Time: 10/29/19  3:07 AM  Result Value Ref Range   Magnesium 1.7 1.7 - 2.4 mg/dL    Comment: Performed at [facility], [place] [address], [place], [place] [address]  Lipid panel     Status: Abnormal   Collection Time: 10/29/19  3:07 AM  Result Value Ref Range     Cholesterol 179 0 - 200 mg/dL   Triglycerides 158 (H) <150 mg/dL   HDL 35 (L) >40 mg/dL   Total CHOL/HDL Ratio 5.1 RATIO   VLDL 32 0 - 40 mg/dL   LDL Cholesterol 112 (H) 0 - 99 mg/dL    Comment:        Total Cholesterol/HDL:CHD Risk Coronary Heart Disease Risk Table                     Men   Women  1/2 Average Risk   3.4   3.3  Average Risk       5.0   4.4  2 X Average Risk   9.6   7.1  3 X Average Risk  23.4   11.0        Use the calculated Patient Ratio above and the CHD Risk Table to determine the patient's CHD Risk.        ATP III CLASSIFICATION (LDL):  <100     mg/dL   Optimal  100-129  mg/dL   Near or Above                    Optimal  130-159  mg/dL   Borderline  160-189  mg/dL   High  >190     mg/dL   Very High Performed at [place] [address] [place], [place] [address]   Hemoglobin A1c     Status: None   Collection Time: 10/29/19  3:07 AM  Result Value Ref Range   Hgb A1c MFr Bld 5.3 4.8 - 5.6 %    Comment: (NOTE) Pre diabetes:          5.7%-6.4% Diabetes:              >6.4% Glycemic control for   <7.0% adults with diabetes    Mean Plasma Glucose 105.41 mg/dL    Comment: Performed at [place] [address] [place], [place] [address]    Imaging / Studies: ECHOCARDIOGRAM COMPLETE  Result Date: 10/28/2019    ECHOCARDIOGRAM REPORT   Patient Name:   Kelly Harrington Date of Exam: 10/28/2019 Medical Rec #:  [id number]            Height:       61.0 in Accession #:    [id number]           Weight:       120.4 lb Date of Birth:  06/09/1946            BSA:          1.522 m Patient Age:    11 years             BP:           141/92 mmHg Patient Gender: F                    HR:           90 bpm. Exam Location:  Inpatient Procedure: 2D Echo, Color Doppler and Cardiac Doppler Indications:    R94.31 Abnormal EKG  History:        Patient has no prior history of Echocardiogram examinations.                 Risk Factors:Dyslipidemia.   Sonographer:    Raquel Sarna Senior RDCS Referring Phys: [id number] Darreld Mclean  Sonographer Comments: Technically difficult study due to poor echo windows. Apical window acquired off axis due to interference. IMPRESSIONS  1. Left ventricular ejection fraction, by estimation, is 70 to 75%. The left ventricle has hyperdynamic function. The left ventricle has no regional wall motion abnormalities. There is mild concentric left ventricular hypertrophy. Left ventricular diastolic parameters are consistent with Grade I diastolic dysfunction (impaired relaxation).  2. Right ventricular systolic function is normal. The right ventricular size is normal.  3. The mitral valve is normal in structure. No evidence of mitral valve regurgitation. No evidence of mitral stenosis.  4. The aortic valve is normal in structure. Aortic valve regurgitation is not visualized. No aortic stenosis is present.  5. The inferior vena cava is normal in size with greater than 50%  respiratory variability, suggesting right atrial pressure of 3 mmHg. FINDINGS  Left Ventricle: Left ventricular ejection fraction, by estimation, is 70 to 75%. The left ventricle has hyperdynamic function. The left ventricle has no regional wall motion abnormalities. The left ventricular internal cavity size was normal in size. There is mild concentric left ventricular hypertrophy. Left ventricular diastolic parameters are consistent with Grade I diastolic dysfunction (impaired relaxation). Normal left ventricular filling pressure. Right Ventricle: The right ventricular size is normal. No increase in right ventricular wall thickness. Right ventricular systolic function is normal. Left Atrium: Left atrial size was normal in size. Right Atrium: Right atrial size was normal in size. Pericardium: There is no evidence of pericardial effusion. Mitral Valve: The mitral valve is normal in structure. Normal mobility of the mitral valve leaflets. No evidence of mitral valve  regurgitation. No evidence of mitral valve stenosis. Tricuspid Valve: The tricuspid valve is normal in structure. Tricuspid valve regurgitation is not demonstrated. No evidence of tricuspid stenosis. Aortic Valve: The aortic valve is normal in structure. Aortic valve regurgitation is not visualized. No aortic stenosis is present. Pulmonic Valve: The pulmonic valve was normal in structure. Pulmonic valve regurgitation is not visualized. No evidence of pulmonic stenosis. Aorta: The aortic root is normal in size and structure. Venous: The inferior vena cava is normal in size with greater than 50% respiratory variability, suggesting right atrial pressure of 3 mmHg. IAS/Shunts: No atrial level shunt detected by color flow Doppler.  LEFT VENTRICLE PLAX 2D LVIDd:         3.00 cm  Diastology LVIDs:         1.70 cm  LV e' lateral:   11.60 cm/s LV PW:         1.00 cm  LV E/e' lateral: 4.9 LV IVS:        1.10 cm  LV e' medial:    5.55 cm/s LVOT diam:     1.90 cm  LV E/e' medial:  10.3 LV SV:         53 LV SV Index:   35 LVOT Area:     2.84 cm  RIGHT VENTRICLE RV S prime:     12.70 cm/s LEFT ATRIUM             Index       RIGHT ATRIUM           Index LA diam:        2.80 cm 1.84 cm/m  RA Area:     10.90 cm LA Vol (A2C):   34.3 ml 22.53 ml/m RA Volume:   24.10 ml  15.83 ml/m LA Vol (A4C):   28.2 ml 18.52 ml/m LA Biplane Vol: 32.1 ml 21.09 ml/m  AORTIC VALVE LVOT Vmax:   106.00 cm/s LVOT Vmean:  81.100 cm/s LVOT VTI:    0.187 m  AORTA Ao Root diam: 2.40 cm Ao Asc diam:  2.50 cm MITRAL VALVE MV Area (PHT): 5.02 cm    SHUNTS MV Decel Time: 151 msec    Systemic VTI:  0.19 m MV E velocity: 57.00 cm/s  Systemic Diam: 1.90 cm MV A velocity: 99.40 cm/s MV E/A ratio:  0.57 Ena Dawley MD Electronically signed by Ena Dawley MD Signature Date/Time: 10/28/2019/4:03:28 PM    Final     Medications / Allergies: per chart  Antibiotics: Anti-infectives (From admission, onward)   None        Note: Portions of this  report may have been transcribed using voice recognition software.  Every effort was made to ensure accuracy; however, inadvertent computerized transcription errors may be present.   Any transcriptional errors that result from this process are unintentional.    Adin Hector, MD, FACS, MASCRS Gastrointestinal and Minimally Invasive Surgery  [facility] [address] [place], [place] [address] [phone] Fax [phone] Main/Paging  CONTACT INFORMATION: Weekday (9AM-5PM) concerns: Call [facility] main office at [phone] Weeknight (5PM-9AM) or Weekend/Holiday concerns: Check [email or web address] for General Surgery [facility] coverage (Please, do not use SecureChat as it is not reliable communication to surgeons for patient care)      10/30/2019  9:27 AM

## 2019-10-31 ENCOUNTER — Inpatient Hospital Stay (HOSPITAL_COMMUNITY): Payer: Medicare Other

## 2019-10-31 NOTE — Progress Notes (Signed)
Geniah Williston [id number] Dec 22, 1945  CARE TEAM:  PCP: Alroy Dust, L.Marlou Sa, MD  Outpatient Care Team: Patient Care Team: [place], Carlean Jews.Marlou Sa, MD as PCP - General (Family Medicine) Elouise Munroe, MD as PCP - Cardiology (Cardiology) Jonnie Finner, RN as Oncology Nurse Navigator Alla Feeling, NP as Nurse Practitioner (Nurse Practitioner) Truitt Merle, MD as Consulting Physician (Hematology)  Inpatient Treatment Team: Treatment Team: Attending Provider: Edison Pace, Md, MD; Nurse Practitioner: Maryanna Shape, NP; Consulting Physician: Truitt Merle, MD; Technician: Lowella Dandy, NT; Registered Nurse: Yvette Rack, RN; Registered Nurse: Oleta Mouse, RN   Problem List:   Active Problems:   SBO (small bowel obstruction) ([place])   Appendiceal tumor      * No surgery found *       [facility] Stay = 6 days)  Assessment/Plan HLD Blood products refusal AKI - resolved, Cr 0.47 Anemia - Hgb10, stable Abnormal EKG - appreciate cardiology consult, CTA recommended as outpatient.  Acute appendicitisS/p laparoscopic appendectomy4/20 Dr. Marlou Starks -POD#25 - path:Low-grade appendiceal mucinous neoplasm (LAMN)>> oncology to see - readmitted 5/9 withconcern forSBO - CT 5/10 reviewed with MD,mild swirling mesentery but there is no evidence of any transition point, perforation, abscess; contrast in colon  ID -none FEN -IVF@ML ,, Boost VTE -SCDs, lovenox Foley -none Follow up -TBD  Plan- Still no bowel function.  Repeat plain film tomorrow.  Consider restarting TNA if no bowel function over the weekend.   If continues to have no improvement, may end up with reexploration.    10/31/2019    Subjective: (Chief complaint) No n/v.  Some belching.  No flatus or BM.    Walking more.  No fevers.  Objective:  Vital signs:  Vitals:   10/30/19 1404 10/30/19 1406 10/30/19 2135 10/31/19 0521  BP: 107/82  (!) 133/95 (!) 133/92  Pulse: 87  96 82    Resp: 17  15 15   Temp: 98.4 F (36.9 C)  98.5 F (36.9 C) 98.8 F (37.1 C)  TempSrc: Oral Oral    SpO2:   100% 100%  Weight:      Height:        Last BM Date: 10/30/19  Intake/Output   Yesterday:  05/14 0701 - 05/15 0700 In: 600 [P.O.:600] Out: 0  This shift:  No intake/output data recorded.  Bowel function:  Flatus: YES  BM:  No  Drain: (No drain)   Physical Exam:  General: Pt awake/alert in no acute distress Neuro: CN II-XII intact w/o focal sensory/motor deficits. Psych:  No delerium/psychosis/paranoia.  Oriented x 4 HENT: Normocephalic, Mucus membranes moist.  No thrush Chest: No pain to chest wall compression.  Good respiratory excursion.  No audible wheezing CV:  Pulses intact.  Regular rhythm.  No major extremity edema MS: Normal AROM mjr joints.  No obvious deformity  Abdomen: Soft.  Moderately distended.  Nontender.  No evidence of peritonitis.  Ext:   No deformity.  No mjr edema.  No cyanosis Skin: No petechiae / purpurea.  No major sores.  Warm and dry    Results:   Cultures: Recent Results (from the past 720 hour(s))  Respiratory Panel by RT PCR (Flu A&B, Covid) - Nasopharyngeal Swab     Status: None   Collection Time: 10/05/19 11:56 PM   Specimen: Nasopharyngeal Swab  Result Value Ref Range Status   SARS Coronavirus 2 by RT PCR NEGATIVE NEGATIVE Final    Comment: (NOTE) SARS-CoV-2 target nucleic acids are NOT DETECTED. The SARS-CoV-2 RNA is generally  detectable in upper respiratoy specimens during the acute phase of infection. The lowest concentration of SARS-CoV-2 viral copies this assay can detect is 131 copies/mL. A negative result does not preclude SARS-Cov-2 infection and should not be used as the sole basis for treatment or other patient management decisions. A negative result may occur with  improper specimen collection/handling, submission of specimen other than nasopharyngeal swab, presence of viral mutation(s) within the areas  targeted by this assay, and inadequate number of viral copies (<131 copies/mL). A negative result must be combined with clinical observations, patient history, and epidemiological information. The expected result is Negative. Fact Sheet for Patients:  PinkCheek.be Fact Sheet for Healthcare Providers:  GravelBags.it This test is not yet ap proved or cleared by the [place] FDA and  has been authorized for detection and/or diagnosis of SARS-CoV-2 by FDA under an Emergency Use Authorization (EUA). This EUA will remain  in effect (meaning this test can be used) for the duration of the COVID-19 declaration under Section 564(b)(1) of the Act, 21 U.S.C. section 360bbb-3(b)(1), unless the authorization is terminated or revoked sooner.    Influenza A by PCR NEGATIVE NEGATIVE Final   Influenza B by PCR NEGATIVE NEGATIVE Final    Comment: (NOTE) The Xpert Xpress SARS-CoV-2/FLU/RSV assay is intended as an aid in  the diagnosis of influenza from Nasopharyngeal swab specimens and  should not be used as a sole basis for treatment. Nasal washings and  aspirates are unacceptable for Xpert Xpress SARS-CoV-2/FLU/RSV  testing. Fact Sheet for Patients: PinkCheek.be Fact Sheet for Healthcare Providers: GravelBags.it This test is not yet approved or cleared by the [place] FDA and  has been authorized for detection and/or diagnosis of SARS-CoV-2 by  FDA under an Emergency Use Authorization (EUA). This EUA will remain  in effect (meaning this test can be used) for the duration of the  Covid-19 declaration under Section 564(b)(1) of the Act, 21  U.S.C. section 360bbb-3(b)(1), unless the authorization is  terminated or revoked. Performed at [facility], [place] [address] [place], [place] [address]   Respiratory Panel by RT PCR (Flu A&B, Covid) - Nasopharyngeal Swab      Status: None   Collection Time: 10/09/19  9:59 PM   Specimen: Nasopharyngeal Swab  Result Value Ref Range Status   SARS Coronavirus 2 by RT PCR NEGATIVE NEGATIVE Final    Comment: (NOTE) SARS-CoV-2 target nucleic acids are NOT DETECTED. The SARS-CoV-2 RNA is generally detectable in upper respiratoy specimens during the acute phase of infection. The lowest concentration of SARS-CoV-2 viral copies this assay can detect is 131 copies/mL. A negative result does not preclude SARS-Cov-2 infection and should not be used as the sole basis for treatment or other patient management decisions. A negative result may occur with  improper specimen collection/handling, submission of specimen other than nasopharyngeal swab, presence of viral mutation(s) within the areas targeted by this assay, and inadequate number of viral copies (<131 copies/mL). A negative result must be combined with clinical observations, patient history, and epidemiological information. The expected result is Negative. Fact Sheet for Patients:  PinkCheek.be Fact Sheet for Healthcare Providers:  GravelBags.it This test is not yet ap proved or cleared by the [place] FDA and  has been authorized for detection and/or diagnosis of SARS-CoV-2 by FDA under an Emergency Use Authorization (EUA). This EUA will remain  in effect (meaning this test can be used) for the duration of the COVID-19 declaration under Section 564(b)(1) of the Act, 21 U.S.C.  section 360bbb-3(b)(1), unless the authorization is terminated or revoked sooner.    Influenza A by PCR NEGATIVE NEGATIVE Final   Influenza B by PCR NEGATIVE NEGATIVE Final    Comment: (NOTE) The Xpert Xpress SARS-CoV-2/FLU/RSV assay is intended as an aid in  the diagnosis of influenza from Nasopharyngeal swab specimens and  should not be used as a sole basis for treatment. Nasal washings and  aspirates are unacceptable  for Xpert Xpress SARS-CoV-2/FLU/RSV  testing. Fact Sheet for Patients: PinkCheek.be Fact Sheet for Healthcare Providers: GravelBags.it This test is not yet approved or cleared by the [place] FDA and  has been authorized for detection and/or diagnosis of SARS-CoV-2 by  FDA under an Emergency Use Authorization (EUA). This EUA will remain  in effect (meaning this test can be used) for the duration of the  Covid-19 declaration under Section 564(b)(1) of the Act, 21  U.S.C. section 360bbb-3(b)(1), unless the authorization is  terminated or revoked. Performed at [facility], [place] [address] [place], [place] [address]   Respiratory Panel by RT PCR (Flu A&B, Covid) - Nasopharyngeal Swab     Status: None   Collection Time: 10/25/19  1:09 AM   Specimen: Nasopharyngeal Swab  Result Value Ref Range Status   SARS Coronavirus 2 by RT PCR NEGATIVE NEGATIVE Final    Comment: (NOTE) SARS-CoV-2 target nucleic acids are NOT DETECTED. The SARS-CoV-2 RNA is generally detectable in upper respiratoy specimens during the acute phase of infection. The lowest concentration of SARS-CoV-2 viral copies this assay can detect is 131 copies/mL. A negative result does not preclude SARS-Cov-2 infection and should not be used as the sole basis for treatment or other patient management decisions. A negative result may occur with  improper specimen collection/handling, submission of specimen other than nasopharyngeal swab, presence of viral mutation(s) within the areas targeted by this assay, and inadequate number of viral copies (<131 copies/mL). A negative result must be combined with clinical observations, patient history, and epidemiological information. The expected result is Negative. Fact Sheet for Patients:  PinkCheek.be Fact Sheet for Healthcare Providers:    GravelBags.it This test is not yet ap proved or cleared by the [place] FDA and  has been authorized for detection and/or diagnosis of SARS-CoV-2 by FDA under an Emergency Use Authorization (EUA). This EUA will remain  in effect (meaning this test can be used) for the duration of the COVID-19 declaration under Section 564(b)(1) of the Act, 21 U.S.C. section 360bbb-3(b)(1), unless the authorization is terminated or revoked sooner.    Influenza A by PCR NEGATIVE NEGATIVE Final   Influenza B by PCR NEGATIVE NEGATIVE Final    Comment: (NOTE) The Xpert Xpress SARS-CoV-2/FLU/RSV assay is intended as an aid in  the diagnosis of influenza from Nasopharyngeal swab specimens and  should not be used as a sole basis for treatment. Nasal washings and  aspirates are unacceptable for Xpert Xpress SARS-CoV-2/FLU/RSV  testing. Fact Sheet for Patients: PinkCheek.be Fact Sheet for Healthcare Providers: GravelBags.it This test is not yet approved or cleared by the [place] FDA and  has been authorized for detection and/or diagnosis of SARS-CoV-2 by  FDA under an Emergency Use Authorization (EUA). This EUA will remain  in effect (meaning this test can be used) for the duration of the  Covid-19 declaration under Section 564(b)(1) of the Act, 21  U.S.C. section 360bbb-3(b)(1), unless the authorization is  terminated or revoked. Performed at [facility], [place] [address], [place], [place] [address]  Labs: No results found for this or any previous visit (from the past 48 hour(s)).  Imaging / Studies: No results found.  Medications / Allergies: per chart  Antibiotics: Anti-infectives (From admission, onward)   None        Note: Portions of this report may have been transcribed using voice recognition software. Every effort was made to ensure accuracy; however, inadvertent  computerized transcription errors may be present.   Any transcriptional errors that result from this process are unintentional.  Stark Klein, MD Surgical Oncology, General Surgery, and Trauma/Critical Care.  [place] [address] [place], [place] [address] [phone] Fax [phone] Main/Paging  CONTACT INFORMATION: Weekday (9AM-5PM) concerns: Call [facility] main office at [phone] Weeknight (5PM-9AM) or Weekend/Holiday concerns: Check [email or web address] for General Surgery [facility] coverage (Please, do not use SecureChat as it is not reliable communication to surgeons for patient care)      10/31/2019  11:47 AM

## 2019-11-01 LAB — BASIC METABOLIC PANEL
Anion gap: 7 (ref 5–15)
BUN: 7 mg/dL — ABNORMAL LOW (ref 8–23)
CO2: 26 mmol/L (ref 22–32)
Calcium: 8.8 mg/dL — ABNORMAL LOW (ref 8.9–10.3)
Chloride: 107 mmol/L (ref 98–111)
Creatinine, Ser: 0.57 mg/dL (ref 0.44–1.00)
GFR calc Af Amer: >60 mL/min (ref >60)
GFR calc non Af Amer: >60 mL/min (ref >60)
Glucose, Bld: 88 mg/dL (ref 70–99)
Potassium: 3.5 mmol/L (ref 3.5–5.1)
Sodium: 140 mmol/L (ref 135–145)

## 2019-11-01 LAB — CBC
HCT: 29.9 % — ABNORMAL LOW (ref 36.0–46.0)
Hemoglobin: 9.6 g/dL — ABNORMAL LOW (ref 12.0–15.0)
MCH: 30 pg (ref 26.0–34.0)
MCHC: 32.1 g/dL (ref 30.0–36.0)
MCV: 93.4 fL (ref 80.0–100.0)
Platelets: 430 10*3/uL — ABNORMAL HIGH (ref 150–400)
RBC: 3.2 MIL/uL — ABNORMAL LOW (ref 3.87–5.11)
RDW: 13 % (ref 11.5–15.5)
WBC: 5.8 10*3/uL (ref 4.0–10.5)
nRBC: 0 % (ref 0.0–0.2)

## 2019-11-01 MED ORDER — KCL IN DEXTROSE-NACL 20-5-0.45 MEQ/L-%-% IV SOLN
INTRAVENOUS | Status: DC
  Filled 2019-11-01 (×6): qty 1000

## 2019-11-01 MED ORDER — POTASSIUM CHLORIDE 10 MEQ/100ML IV SOLN
10.0000 meq | INTRAVENOUS | Status: AC
  Administered 2019-11-01 (×2): 10 meq via INTRAVENOUS
  Filled 2019-11-01 (×2): qty 100

## 2019-11-01 NOTE — Progress Notes (Signed)
[facility] Office:  [phone] General Surgery Progress Note   LOS: 7 days  POD -     Assessment and Plan: 1.  SBO  readmitted 5/9  KUB 5/15 - shows multiple dilated loops with AF levels - persistent SBO  Will back off diet to clear liquids, increase IVF, repeat KUB tomorrow 2.  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  Jehovah's Witness 4.  Anemia  Hgb - 9.6 - 11/01/2019 5.  DVT prophylaxis - Lovenox 6.  K+ - 3.5 - 11/01/2019  Give IV potassium   Active Problems:   SBO (small bowel obstruction) ([place])   Appendiceal tumor  Subjective:  Some burping, but had had BM yesterday and passing flatus  Husband at beside  Objective:   Vitals:   10/31/19 2008 11/01/19 0503  BP: 132/89 124/81  Pulse: 90 78  Resp: 16 15  Temp: 98.7 F (37.1 C) 98 F (36.7 C)  SpO2: 99% 99%     Intake/Output from previous day:  05/15 0701 - 05/16 0700 In: 475.6 [P.O.:360; I.V.:115.6] Out: 0   Intake/Output this shift:  No intake/output data recorded.   Physical Exam:   General: WN AA F who is alert and oriented.    HEENT: Normal. Pupils equal. .   Lungs: Clear   Abdomen: Mildly distended, has BS, no localized tenderness   Lab Results:    Recent Labs    11/01/19 0400  WBC 5.8  HGB 9.6*  HCT 29.9*  PLT 430*    BMET   Recent Labs    11/01/19 0400  NA 140  K 3.5  CL 107  CO2 26  GLUCOSE 88  BUN 7*  CREATININE 0.57  CALCIUM 8.8*    PT/INR  No results for input(s): LABPROT, INR in the last 72 hours.  ABG  No results for input(s): PHART, HCO3 in the last 72 hours.  Invalid input(s): PCO2, PO2   Studies/Results:  DG Abd 2 Views  Result Date: 10/31/2019 CLINICAL DATA:  Abdominal distension, recent appendectomy EXAM: ABDOMEN - 2 VIEW COMPARISON:  10/25/2019 FINDINGS: Interval NG tube removal. Several dilated loops of small bowel noted with air-fluid levels. Small bowel diameter is  minimally improved measuring 4.5 cm, previously 5 cm. No free air evident on the upright exam. There is interval evacuation of the bowel contrast compared 10/25/2019. Air and stool noted in rectum. IMPRESSION: Minimal improvement in small bowel dilatation with persistent multiple air-fluid levels compatible with partial obstruction versus ileus. No free air. Electronically Signed   By: Jerilynn Mages.  Shick M.D.   On: 10/31/2019 14:15     Anti-infectives:   Anti-infectives (From admission, onward)   None      Alphonsa Overall, MD, [facility] Surgery Office: [phone] 11/01/2019

## 2019-11-02 ENCOUNTER — Inpatient Hospital Stay (HOSPITAL_COMMUNITY): Payer: Medicare Other

## 2019-11-02 LAB — CBC WITH DIFFERENTIAL/PLATELET
Abs Immature Granulocytes: 0.02 10*3/uL (ref 0.00–0.07)
Basophils Absolute: 0 10*3/uL (ref 0.0–0.1)
Basophils Relative: 1 %
Eosinophils Absolute: 0.1 10*3/uL (ref 0.0–0.5)
Eosinophils Relative: 2 %
HCT: 31.4 % — ABNORMAL LOW (ref 36.0–46.0)
Hemoglobin: 9.7 g/dL — ABNORMAL LOW (ref 12.0–15.0)
Immature Granulocytes: 0 %
Lymphocytes Relative: 50 %
Lymphs Abs: 2.4 10*3/uL (ref 0.7–4.0)
MCH: 29.5 pg (ref 26.0–34.0)
MCHC: 30.9 g/dL (ref 30.0–36.0)
MCV: 95.4 fL (ref 80.0–100.0)
Monocytes Absolute: 0.6 10*3/uL (ref 0.1–1.0)
Monocytes Relative: 13 %
Neutro Abs: 1.6 10*3/uL — ABNORMAL LOW (ref 1.7–7.7)
Neutrophils Relative %: 34 %
Platelets: 429 10*3/uL — ABNORMAL HIGH (ref 150–400)
RBC: 3.29 MIL/uL — ABNORMAL LOW (ref 3.87–5.11)
RDW: 13.3 % (ref 11.5–15.5)
WBC: 4.7 10*3/uL (ref 4.0–10.5)
nRBC: 0 % (ref 0.0–0.2)

## 2019-11-02 LAB — BASIC METABOLIC PANEL
Anion gap: 8 (ref 5–15)
BUN: <5 mg/dL — ABNORMAL LOW (ref 8–23)
CO2: 23 mmol/L (ref 22–32)
Calcium: 8.7 mg/dL — ABNORMAL LOW (ref 8.9–10.3)
Chloride: 109 mmol/L (ref 98–111)
Creatinine, Ser: 0.51 mg/dL (ref 0.44–1.00)
GFR calc Af Amer: >60 mL/min (ref >60)
GFR calc non Af Amer: >60 mL/min (ref >60)
Glucose, Bld: 90 mg/dL (ref 70–99)
Potassium: 3.6 mmol/L (ref 3.5–5.1)
Sodium: 140 mmol/L (ref 135–145)

## 2019-11-02 LAB — PREALBUMIN: Prealbumin: 18.4 mg/dL (ref 18–38)

## 2019-11-02 MED ORDER — POTASSIUM CHLORIDE CRYS ER 20 MEQ PO TBCR
20.0000 meq | EXTENDED_RELEASE_TABLET | Freq: Once | ORAL | Status: AC
  Administered 2019-11-02: 20 meq via ORAL
  Filled 2019-11-02: qty 1

## 2019-11-02 NOTE — Care Management Important Message (Signed)
Important Message  Patient Details IM Letter given to Nancy Marus RN Case Manager to present to the Patient Name: Kelly Harrington MRN: [id number] Date of Birth: [birth year]   Medicare Important Message Given:  Yes     Kerin Salen 11/02/2019, 12:21 PM

## 2019-11-02 NOTE — Progress Notes (Signed)
[name]:  [phone] General Surgery Progress Note   LOS: 8 days  POD -     Assessment and Plan: 1.  SBO  readmitted 5/9  KUB 5/17 shows resolving pattern; clinically improving much as well  Advance to full liquids but with plans to continue full liquid diet for next 4-6 weeks - protein shakes, pureed, etc 2.  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  Jehovah's Witness 4.  Anemia  Hgb - 9.7 - 11/01/2019 5.  DVT prophylaxis - Lovenox 6.  Hypokalemia - 3.6 - 11/01/2019  Give PO potassium   Active Problems:   SBO (small bowel obstruction) ([facility])   Appendiceal tumor  Subjective:  Some burping, reports the 'flood gates opened' yesterday - had 4 bms and lots of flatus. Denies abdominal pain. Bloating remains to a small extent but overall improving. Denies n/v  Objective:   Vitals:   11/01/19 2058 11/02/19 0641  BP: 132/82 (!) 144/94  Pulse: 86 81  Resp: 16 18  Temp: 98.3 F (36.8 C) 98.3 F (36.8 C)  SpO2: 99% 100%     Intake/Output from previous day:  05/16 0701 - 05/17 0700 In: 3022.4 [P.O.:1440; I.V.:1382.4; IV Piggyback:200] Out: -   Intake/Output this shift:  No intake/output data recorded.   Physical Exam:   General: WN AA F who is alert and oriented.    HEENT: Normal. Pupils equal. .   Lungs: Clear   Abdomen: Mildly distended, has BS, no localized tenderness; incisions well healed without palpable hernias   Lab Results:    Recent Labs    11/01/19 0400 11/02/19 0257  WBC 5.8 4.7  HGB 9.6* 9.7*  HCT 29.9* 31.4*  PLT 430* 429*    BMET   Recent Labs    11/01/19 0400 11/02/19 0257  NA 140 140  K 3.5 3.6  CL 107 109  CO2 26 23  GLUCOSE 88 90  BUN 7* <5*  CREATININE 0.57 0.51  CALCIUM 8.8* 8.7*    PT/INR  No results for input(s): LABPROT, INR in the last 72 hours.  ABG  No results for input(s): PHART, HCO3 in the last 72 hours.  Invalid  input(s): PCO2, PO2   Studies/Results:  DG Abd 2 Views  Result Date: 11/02/2019 CLINICAL DATA:  Follow-up small bowel obstruction. EXAM: ABDOMEN - 2 VIEW COMPARISON:  10/31/2019 FINDINGS: Mild dilatation of small bowel loops has decreased since previous study. Persistent air-fluid levels are seen. Paucity of colonic gas is again noted in suspicious for distal small bowel obstruction. Surgical clips noted in the right lower quadrant. IMPRESSION: Decreased small bowel dilatation and air-fluid levels since prior exam. Electronically Signed   By: Marlaine Hind M.D.   On: 11/02/2019 08:35   DG Abd 2 Views  Result Date: 10/31/2019 CLINICAL DATA:  Abdominal distension, recent appendectomy EXAM: ABDOMEN - 2 VIEW COMPARISON:  10/25/2019 FINDINGS: Interval NG tube removal. Several dilated loops of small bowel noted with air-fluid levels. Small bowel diameter is minimally improved measuring 4.5 cm, previously 5 cm. No free air evident on the upright exam. There is interval evacuation of the bowel contrast compared 10/25/2019. Air and stool noted in rectum. IMPRESSION: Minimal improvement in small bowel dilatation with persistent multiple air-fluid levels compatible with partial obstruction versus ileus. No free air. Electronically Signed   By: Jerilynn Mages.  Shick M.D.   On: 10/31/2019 14:15     Anti-infectives:   Anti-infectives (From admission,  onward)   None     [name], M.D. [facility] Use [email or web address] to contact on call provider

## 2019-11-03 NOTE — Progress Notes (Signed)
Patient ID: Kelly Harrington, female   DOB: 25-Feb-1946, 74 y.o.   MRN: [id number]       Subjective: Feeling well.  Continues to pass flatus.  Feels less bloated today.  Gets some bloating at nights sometimes.  Tolerating full liquids well currently.  Wanting some education on full liquids and what to expect to be able to eat at home.  ROS: See above, otherwise other systems negative  Objective: Vital signs in last 24 hours: Temp:  [98 F (36.7 C)-98.1 F (36.7 C)] 98 F (36.7 C) (05/18 0600) Pulse Rate:  [72-87] 72 (05/18 0600) Resp:  [16] 16 (05/18 0600) BP: (142-150)/(80-92) 148/80 (05/18 0600) SpO2:  [100 %] 100 % (05/18 0600) Last BM Date: 11/02/19  Intake/Output from previous day: 05/17 0701 - 05/18 0700 In: 2239.7 [P.O.:720; I.V.:1519.7] Out: -  Intake/Output this shift: Total I/O In: 360 [P.O.:360] Out: -   PE: Heart: regular Lungs: CTAB Abd: soft, NT, ND, +BS  Lab Results:  Recent Labs    11/01/19 0400 11/02/19 0257  WBC 5.8 4.7  HGB 9.6* 9.7*  HCT 29.9* 31.4*  PLT 430* 429*   BMET Recent Labs    11/01/19 0400 11/02/19 0257  NA 140 140  K 3.5 3.6  CL 107 109  CO2 26 23  GLUCOSE 88 90  BUN 7* <5*  CREATININE 0.57 0.51  CALCIUM 8.8* 8.7*   PT/INR No results for input(s): LABPROT, INR in the last 72 hours. CMP     Component Value Date/Time   NA 140 11/02/2019 0257   K 3.6 11/02/2019 0257   CL 109 11/02/2019 0257   CO2 23 11/02/2019 0257   GLUCOSE 90 11/02/2019 0257   BUN <5 (L) 11/02/2019 0257   CREATININE 0.51 11/02/2019 0257   CALCIUM 8.7 (L) 11/02/2019 0257   PROT 9.3 (H) 10/24/2019 2258   ALBUMIN 5.1 (H) 10/24/2019 2258   AST 29 10/24/2019 2258   ALT 35 10/24/2019 2258   ALKPHOS 81 10/24/2019 2258   BILITOT 2.1 (H) 10/24/2019 2258   GFRNONAA >60 11/02/2019 0257   GFRAA >60 11/02/2019 0257   Lipase     Component Value Date/Time   LIPASE 42 10/24/2019 2258       Studies/Results: DG Abd 2 Views  Result Date:  11/02/2019 CLINICAL DATA:  Follow-up small bowel obstruction. EXAM: ABDOMEN - 2 VIEW COMPARISON:  10/31/2019 FINDINGS: Mild dilatation of small bowel loops has decreased since previous study. Persistent air-fluid levels are seen. Paucity of colonic gas is again noted in suspicious for distal small bowel obstruction. Surgical clips noted in the right lower quadrant. IMPRESSION: Decreased small bowel dilatation and air-fluid levels since prior exam. Electronically Signed   By: Marlaine Hind M.D.   On: 11/02/2019 08:35    Anti-infectives: Anti-infectives (From admission, onward)   None       Assessment/Plan HLD Blood products refusal AKI - resolved, Cr 0.47 Anemia - stable Abnormal EKG - appreciate cardiology consult, ECHO obtained.  Planned outpatient follow up  Acute appendicitisS/p laparoscopic appendectomy for LAMN4/20 Dr. Marlou Starks -POD#28 - tolerating full liquids -cont FLD today and if continues to tolerate will plan for DC home tomorrow -dietitian to see patient today to discuss diet options at home.  Plan FLD for 4 weeks at home.  ID -none FEN -FLD, Boost VTE -SCDs, lovenox Foley -none Follow up Marlou Starks, cards   LOS: 9 days    Henreitta Cea , [facility] 11/03/2019, 11:29 AM Please see Amion  for pager number during day hours 7:00am-4:30pm or 7:00am -11:30am on weekends

## 2019-11-03 NOTE — Progress Notes (Signed)
Patient ambulating 20 minutes each time she is ambulating in the hallway. She reports that she stopped counting laps, and decided to focus on time she is up walking.   She has ambulated 3 times so far on my shift for a total of 20 minutes each time.    [place], Therapist, sports

## 2019-11-04 MED ORDER — POLYETHYLENE GLYCOL 3350 17 G PO PACK: 17.0000 g | PACK | Freq: Two times a day (BID) | ORAL | 0 refills | Status: DC

## 2019-11-04 NOTE — Progress Notes (Signed)
Nutrition Education Note  RD consulted for nutrition education for patient who is to be on FLD for 4 weeks after discharge.   RD provided "Full Liquid Nutrition Therapy" handout from the [employer] of [employer]. Patient is POD #29 lap appy and this is her second admission since discharge following surgery, per patient report.   RD reviewed handout in detail with patient, recommended daily multivitamin with minerals (patient has one at home that she can take), and provided protein goal.   Patient asked about applesauce, plan is to avoid until diet is further advanced. She has been drinking Boost Breeze BID and enjoys them. Her brother has ordered some for her and they will arrive on 5/24. Plan to get Ensure Clear to have until [facility] arrives.  No other nutrition-related questions or concerns at this time. Patient is likely discharging home later today.   Expect good compliance.  Body mass index is 21.97 kg/m. Pt meets criteria for normal weight based on current BMI.  Current diet order is FLD, patient is consuming approximately 50-100% of meals at this time. Labs and medications reviewed. No further nutrition interventions warranted at this time. RD contact information provided. If additional nutrition issues arise, please re-consult RD.      Jarome Matin, MS, RD, LDN, CNSC Inpatient Clinical Dietitian RD pager # available in [place]  After hours/weekend pager # available in [facility]

## 2019-11-04 NOTE — Plan of Care (Signed)
  Problem: Clinical Measurements: Goal: Ability to maintain clinical measurements within normal limits will improve Outcome: Adequate for Discharge Goal: Will remain free from infection Outcome: Adequate for Discharge Goal: Diagnostic test results will improve Outcome: Adequate for Discharge   Problem: Activity: Goal: Risk for activity intolerance will decrease Outcome: Adequate for Discharge   Problem: Nutrition: Goal: Adequate nutrition will be maintained Outcome: Adequate for Discharge   Problem: Safety: Goal: Ability to remain free from injury will improve Outcome: Adequate for Discharge

## 2019-11-04 NOTE — Progress Notes (Signed)
Reviewed AVS with patient and husband.  Answered all questions.  Transported to the main lobby and turned over to her husband without complaints or concerns.  Patient alert and oriented.  Skin warm and dry.

## 2019-11-05 ENCOUNTER — Ambulatory Visit: Payer: Medicare Other | Admitting: Nurse Practitioner

## 2019-11-17 ENCOUNTER — Encounter: Payer: Self-pay | Admitting: Internal Medicine

## 2019-11-17 ENCOUNTER — Other Ambulatory Visit: Payer: Self-pay

## 2019-11-17 ENCOUNTER — Ambulatory Visit: Payer: Medicare Other | Admitting: Internal Medicine

## 2019-11-17 VITALS — BP 142/70 | HR 72 | Ht 61.0 in | Wt 115.4 lb

## 2019-11-17 DIAGNOSIS — K56609 Unspecified intestinal obstruction, unspecified as to partial versus complete obstruction: Secondary | ICD-10-CM

## 2019-11-17 DIAGNOSIS — E785 Hyperlipidemia, unspecified: Secondary | ICD-10-CM | POA: Diagnosis not present

## 2019-11-17 DIAGNOSIS — I2584 Coronary atherosclerosis due to calcified coronary lesion: Secondary | ICD-10-CM

## 2019-11-17 DIAGNOSIS — D373 Neoplasm of uncertain behavior of appendix: Secondary | ICD-10-CM

## 2019-11-17 DIAGNOSIS — R9431 Abnormal electrocardiogram [ECG] [EKG]: Secondary | ICD-10-CM

## 2019-11-17 DIAGNOSIS — I2510 Atherosclerotic heart disease of native coronary artery without angina pectoris: Secondary | ICD-10-CM

## 2019-11-17 DIAGNOSIS — I10 Essential (primary) hypertension: Secondary | ICD-10-CM

## 2019-11-17 NOTE — Progress Notes (Signed)
Cardiology Office Note:    Date:  11/17/2019   ID:  Kelly Harrington, DOB Mar 26, 1946, MRN [id number]  PCP:  Alroy Dust, Carlean Jews.Marlou Sa, MD  Cardiologist:  Elouise Munroe, MD  Electrophysiologist:  None   Referring MD: Aurea Graff.Marlou Sa, MD   Chief Complaint: Follow-up hospitalization, coronary artery calcifications  History of Present Illness:    Kelly Harrington is a 74 y.o. female with a recent history of acute appendicitis with laparoscopic appendectomy 10/06/2019 with Dr. 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  She is very active at baseline, has no cardiopulmonary symptoms, and for abnormal ECG we obtain an echocardiogram which was unremarkable for structural issues.  While in hospital she was noted to have a small bowel obstruction requiring NG tube placement.  She has been on a liquid diet since hospital dismissal, and while she does not feel 100% back to normal, she is feeling much better.  She has been walking on the treadmill twice a day without issues.  I commended her for this.  She has had no chest pain, and despite her postoperative recovery, has no shortness of breath suggestive of deconditioning or pathologic cardiac process.  She denies palpitations, PND, orthopnea, leg swelling, syncope, lightheadedness.  Past Medical History:  Diagnosis Date  . High cholesterol   . SBO (small bowel obstruction) (HCC)     Past Surgical History:  Procedure Laterality Date  . APPENDECTOMY    . LAPAROSCOPIC APPENDECTOMY N/A 10/06/2019   Procedure: APPENDECTOMY LAPAROSCOPIC;  Surgeon: Jovita Kussmaul, MD;  Location: [facility];  Service: General;  Laterality: N/A;  . ROTATOR CUFF REPAIR    . TRIGGER FINGER RELEASE      Current Medications: Current Meds  Medication Sig  .  acetaminophen (TYLENOL) 500 MG tablet Take 500-1,000 mg by mouth every 6 (six) hours as needed for moderate pain.  Marland Kitchen atorvastatin (LIPITOR) 40 MG tablet Take 40 mg by mouth every Monday, Wednesday, and Friday. Take on MWF  . Biotin 1 MG CAPS Take 1 capsule by mouth daily.  . cetirizine (ZYRTEC) 10 MG tablet Take 10 mg by mouth daily as needed for allergies.  . cholecalciferol (VITAMIN D3) 25 MCG (1000 UNIT) tablet Take 1,000 Units by mouth daily.  . feeding supplement, ENSURE ENLIVE, (ENSURE ENLIVE) LIQD Take 237 mLs by mouth 2 (two) times daily between meals.  . Multiple Vitamin (MULTIVITAMIN WITH MINERALS) TABS tablet Take 1 tablet by mouth daily.  . polyethylene glycol (MIRALAX / GLYCOLAX) 17 g packet Take 17 g by mouth 2 (two) times daily.     Allergies:   Codeine   Social History   Socioeconomic History  . Marital status: Married    Spouse name: Not on file  . Number of children: Not on file  . Years of education: Not on file  . Highest education level: Not on file  Occupational History  . Not on file  Tobacco Use  . Smoking status: Never Smoker  . Smokeless tobacco: Never Used  Substance and Sexual Activity  . Alcohol use: No  . Drug use: No  . Sexual activity: Not on file  Other Topics Concern  . Not on file  Social History Narrative  . Not on file   Social Determinants of Health   Financial Resource Strain:   . Difficulty of Paying Living  Expenses:   Food Insecurity:   . Worried About Charity fundraiser in the Last Year:   . Arboriculturist in the Last Year:   Transportation Needs:   . Film/video editor (Medical):   Marland Kitchen Lack of Transportation (Non-Medical):   Physical Activity:   . Days of Exercise per Week:   . Minutes of Exercise per Session:   Stress:   . Feeling of Stress :   Social Connections:   . Frequency of Communication with Friends and Family:   . Frequency of Social Gatherings with Friends and Family:   . Attends Religious Services:   .  Active Member of Clubs or Organizations:   . Attends Archivist Meetings:   Marland Kitchen Marital Status:      Family History: The patient's family history includes Heart failure in her father and mother; Hypertension in her father and mother.  ROS:   Please see the history of present illness.    All other systems reviewed and are negative.  EKGs/Labs/Other Studies Reviewed:    The following studies were reviewed today:  EKG: Sinus rhythm, PACs, poor R wave progression  Recent Labs: 10/24/2019: ALT 35 10/29/2019: Magnesium 1.7 11/02/2019: BUN <5; Creatinine, Ser 0.51; Hemoglobin 9.7; Platelets 429; Potassium 3.6; Sodium 140  Recent Lipid Panel    Component Value Date/Time   CHOL 179 10/29/2019 0307   TRIG 158 (H) 10/29/2019 0307   HDL 35 (L) 10/29/2019 0307   CHOLHDL 5.1 10/29/2019 0307   VLDL 32 10/29/2019 0307   LDLCALC 112 (H) 10/29/2019 0307    Physical Exam:    VS:  BP (!) 142/70   Pulse 72   Ht 5\' 1"  (1.549 m)   Wt 115 lb 6.4 oz (52.3 kg)   BMI 21.80 kg/m     Wt Readings from Last 5 Encounters:  11/17/19 115 lb 6.4 oz (52.3 kg)  11/03/19 116 lb 4.8 oz (52.8 kg)  10/12/19 120 lb 6.4 oz (54.6 kg)  10/06/19 126 lb 1.7 oz (57.2 kg)  06/27/17 111 lb (50.3 kg)     Constitutional: No acute distress Eyes: sclera non-icteric, normal conjunctiva and lids ENMT: normal dentition, moist mucous membranes Cardiovascular: regular rhythm, normal rate, no murmurs. S1 and S2 normal. Radial pulses normal bilaterally. No jugular venous distention.  Respiratory: clear to auscultation bilaterally GI : normal bowel sounds, soft and nontender. No distention.   MSK: extremities warm, well perfused. No edema.  NEURO: grossly nonfocal exam, moves all extremities. PSYCH: alert and oriented x 3, normal mood and affect.   ASSESSMENT:    1. Coronary artery calcification   2. Abnormal EKG   3. Essential hypertension   4. Hyperlipidemia, unspecified hyperlipidemia type   5. SBO (small  bowel obstruction) ([place])   6. Appendiceal tumor    PLAN:    Coronary artery calcification  Abnormal EKG  Essential hypertension  Hyperlipidemia, unspecified hyperlipidemia type  SBO (small bowel obstruction) ([place])  Appendiceal tumor  She is asymptomatic from a cardiopulmonary standpoint and continues to recover from appendectomy complicated by small bowel obstruction and identification of appendiceal tumor.  For coronary artery calcifications with no definite structural heart disease on echo, secondary prevention of CAD is indicated.  When she is felt to be stabilized from a postoperative and small bowel obstruction standpoint, would recommend initiating an 81 mg aspirin regimen daily.  She tolerated this well for a few days in the hospital.  Given her overall gastrointestinal issues, would be reasonable  for her to start this after she is feeling better.  She restarted her Lipitor and she should continue that going forward for hyperlipidemia and secondary prevention of CAD.  I have encouraged her to continue to exercise and if she has any chest pain, chest pressure, shortness of breath, she should contact our office at which point it would be best to perform ischemic testing such as a stress Myoview or CT coronary angiogram.  I have offered stress testing to the patient today to further risk stratify, she would like to defer at this time.  We participated in shared decision making, this is reasonable.   Total time of encounter: 30 minutes total time of encounter, including 20 minutes spent in face-to-face patient care on the date of this encounter. This time includes coordination of care and counseling regarding above mentioned problem list. Remainder of non-face-to-face time involved reviewing chart documents/testing relevant to the patient encounter and documentation in the medical record. I have independently reviewed documentation from referring provider.   Cherlynn Kaiser, MD [facility]  [facility]    Medication Adjustments/Labs and Tests Ordered: Current medicines are reviewed at length with the patient today.  Concerns regarding medicines are outlined above.  No orders of the defined types were placed in this encounter.  No orders of the defined types were placed in this encounter.   Patient Instructions  Medication Instructions:  Your physician recommends that you continue on your current medications as directed. Please refer to the Current Medication list given to you today.  --start Aspirin 81 mg daily when ok with your surgeon  Follow-Up: At [facility], you and your health needs are our priority.  As part of our continuing mission to provide you with exceptional heart care, we have created designated Provider Care Teams.  These Care Teams include your primary Cardiologist (physician) and Advanced Practice Providers (APPs -  Physician Assistants and Nurse Practitioners) who all work together to provide you with the care you need, when you need it.  We recommend signing up for the patient portal called "MyChart".  Sign up information is provided on this After Visit Summary.  MyChart is used to connect with patients for Virtual Visits (Telemedicine).  Patients are able to view lab/test results, encounter notes, upcoming appointments, etc.  Non-urgent messages can be sent to your provider as well.   To learn more about what you can do with MyChart, go to [email or web address].    Your next appointment:   6 month(s)  The format for your next appointment:   In Person  Provider:   Cherlynn Kaiser, MD

## 2019-11-17 NOTE — Patient Instructions (Signed)
Medication Instructions:  Your physician recommends that you continue on your current medications as directed. Please refer to the Current Medication list given to you today.  --start Aspirin 81 mg daily when ok with your surgeon  Follow-Up: At [facility], you and your health needs are our priority.  As part of our continuing mission to provide you with exceptional heart care, we have created designated Provider Care Teams.  These Care Teams include your primary Cardiologist (physician) and Advanced Practice Providers (APPs -  Physician Assistants and Nurse Practitioners) who all work together to provide you with the care you need, when you need it.  We recommend signing up for the patient portal called "MyChart".  Sign up information is provided on this After Visit Summary.  MyChart is used to connect with patients for Virtual Visits (Telemedicine).  Patients are able to view lab/test results, encounter notes, upcoming appointments, etc.  Non-urgent messages can be sent to your provider as well.   To learn more about what you can do with MyChart, go to [email or web address].    Your next appointment:   6 month(s)  The format for your next appointment:   In Person  Provider:   Cherlynn Kaiser, MD

## 2020-03-25 ENCOUNTER — Ambulatory Visit: Payer: Medicare Other | Attending: Internal Medicine

## 2020-03-25 DIAGNOSIS — Z23 Encounter for immunization: Secondary | ICD-10-CM

## 2020-03-25 NOTE — Progress Notes (Signed)
° °  Covid-19 Vaccination Clinic  Name:  Elisabeth Strom    MRN: [id number] DOB: [birth year]  03/25/2020  Ms. Womac was observed post Covid-19 immunization for 15 minutes without incident. She was provided with Vaccine Information Sheet and instruction to access the V-Safe system.   Ms. Darnell was instructed to call 911 with any severe reactions post vaccine:  Difficulty breathing   Swelling of face and throat   A fast heartbeat   A bad rash all over body   Dizziness and weakness

## 2020-06-01 ENCOUNTER — Ambulatory Visit: Payer: Medicare Other | Admitting: Internal Medicine

## 2020-06-01 ENCOUNTER — Encounter: Payer: Self-pay | Admitting: Internal Medicine

## 2020-06-01 ENCOUNTER — Other Ambulatory Visit: Payer: Self-pay

## 2020-06-01 VITALS — BP 144/85 | HR 66 | Ht 61.0 in | Wt 116.4 lb

## 2020-06-01 DIAGNOSIS — I2510 Atherosclerotic heart disease of native coronary artery without angina pectoris: Secondary | ICD-10-CM | POA: Diagnosis not present

## 2020-06-01 DIAGNOSIS — Z79899 Other long term (current) drug therapy: Secondary | ICD-10-CM

## 2020-06-01 DIAGNOSIS — E785 Hyperlipidemia, unspecified: Secondary | ICD-10-CM

## 2020-06-01 DIAGNOSIS — I2584 Coronary atherosclerosis due to calcified coronary lesion: Secondary | ICD-10-CM | POA: Diagnosis not present

## 2020-06-01 LAB — COMPREHENSIVE METABOLIC PANEL
ALT: 19 IU/L (ref 0–32)
AST: 31 IU/L (ref 0–40)
Albumin/Globulin Ratio: 1.5 (ref 1.2–2.2)
Albumin: 4.6 g/dL (ref 3.7–4.7)
Alkaline Phosphatase: 49 IU/L (ref 44–121)
BUN/Creatinine Ratio: 10 — ABNORMAL LOW (ref 12–28)
BUN: 7 mg/dL — ABNORMAL LOW (ref 8–27)
Bilirubin Total: 1.5 mg/dL — ABNORMAL HIGH (ref 0.0–1.2)
CO2: 26 mmol/L (ref 20–29)
Calcium: 10.1 mg/dL (ref 8.7–10.3)
Chloride: 102 mmol/L (ref 96–106)
Creatinine, Ser: 0.72 mg/dL (ref 0.57–1.00)
GFR calc Af Amer: 97 mL/min/{1.73_m2} (ref >59)
GFR calc non Af Amer: 84 mL/min/{1.73_m2} (ref >59)
Globulin, Total: 3.1 g/dL (ref 1.5–4.5)
Glucose: 83 mg/dL (ref 65–99)
Potassium: 4.1 mmol/L (ref 3.5–5.2)
Sodium: 140 mmol/L (ref 134–144)
Total Protein: 7.7 g/dL (ref 6.0–8.5)

## 2020-06-01 LAB — LIPID PANEL
Chol/HDL Ratio: 2.9 ratio (ref 0.0–4.4)
Cholesterol, Total: 251 mg/dL — ABNORMAL HIGH (ref 100–199)
HDL: 88 mg/dL (ref >39)
LDL Chol Calc (NIH): 144 mg/dL — ABNORMAL HIGH (ref 0–99)
Triglycerides: 113 mg/dL (ref 0–149)
VLDL Cholesterol Cal: 19 mg/dL (ref 5–40)

## 2020-06-01 NOTE — Patient Instructions (Signed)
Medication Instructions:  No Changes In Medications at this time.  *If you need a refill on your cardiac medications before your next appointment, please call your pharmacy*  Lab Work: [place] If you have labs (blood work) drawn today and your tests are completely normal, you will receive your results only by: Marland Kitchen MyChart Message (if you have MyChart) OR . A paper copy in the mail If you have any lab test that is abnormal or we need to change your treatment, we will call you to review the results.  Follow-Up: At [facility], you and your health needs are our priority.  As part of our continuing mission to provide you with exceptional heart care, we have created designated Provider Care Teams.  These Care Teams include your primary Cardiologist (physician) and Advanced Practice Providers (APPs -  Physician Assistants and Nurse Practitioners) who all work together to provide you with the care you need, when you need it.  Your next appointment:   6 month(s)  The format for your next appointment:   In Person  Provider:   Cherlynn Kaiser, MD  Other Instructions PLEASE CONTACT OUR OFFICE [phone] in 1 MONTH IF YOUR BLOOD PRESSURE IS CONSISTENTLY OVER 140/90. PLEASE WRITE BLOOD PRESSURES DOWN DAILY ON LOG PROVIDED.

## 2020-06-01 NOTE — Progress Notes (Signed)
Cardiology Office Note:    Date:  06/01/2020   ID:  Kelly Harrington, DOB 1945-07-26, MRN [id number]  PCP:  Alroy Dust, Carlean Jews.Marlou Sa, MD  Cardiologist:  Elouise Munroe, MD  Electrophysiologist:  None   Referring MD: Aurea Graff.Marlou Sa, MD   Chief Complaint/Reason for Referral: Follow up coronary artery calcifications  History of Present Illness:    Kelly Harrington is a 74 y.o. female with a history of acute appendicitis with laparoscopic appendectomy 10/06/2019 with Dr. 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  She is very active at baseline, has no cardiopulmonary symptoms, and for abnormal ECG we obtain an echocardiogram which was unremarkable for structural issues.  While in hospital she was noted to have a small bowel obstruction requiring NG tube placement.  At our last visit on 11/17/19 - we started asa 81 mg daily and recommended resuming atorvastatin. She is now on atorvastatin 40 mg MWF, due to myalgias with daily dosing.   She exercises frequently on her treadmill. No symptoms with moderate exertion.   She had one episode recently of right sided chest pressure without inciting factors, not related to activity. This has been an isolated episode since our last appointment.   The patient denies dyspnea at rest or with exertion, palpitations, PND, orthopnea, or leg swelling. Denies cough, fever, chills. Denies nausea, vomiting. Denies syncope or presyncope. Denies dizziness or lightheadedness.  Past Medical History:  Diagnosis Date  . High cholesterol   . SBO (small bowel obstruction) (HCC)     Past Surgical History:  Procedure Laterality Date  . APPENDECTOMY    . LAPAROSCOPIC APPENDECTOMY N/A 10/06/2019   Procedure: APPENDECTOMY LAPAROSCOPIC;  Surgeon: Jovita Kussmaul, MD;  Location: [facility];  Service: General;  Laterality: N/A;  . ROTATOR CUFF REPAIR    . TRIGGER FINGER RELEASE      Current Medications: Current Meds  Medication Sig  . aspirin 81 MG chewable tablet 1 tablet  . atorvastatin (LIPITOR) 40 MG tablet Take 40 mg by mouth every Monday, Wednesday, and Friday. Take on MWF  . Biotin 1 MG CAPS Take 1 capsule by mouth daily.  . cetirizine (ZYRTEC) 10 MG tablet Take 10 mg by mouth daily as needed for allergies.  . cholecalciferol (VITAMIN D3) 25 MCG (1000 UNIT) tablet Take 1,000 Units by mouth daily.  . diclofenac (VOLTAREN) 75 MG EC tablet 1 tablet with food or milk  . Multiple Minerals-Vitamins (CALCIUM CITRATE PLUS) TABS See admin instructions.  . Multiple Vitamin (MULTIVITAMIN WITH MINERALS) TABS tablet Take 1 tablet by mouth daily.  . Multiple Vitamins-Minerals (MULTI FOR HER 50+) TABS 1 tablet  . polyethylene glycol (MIRALAX / GLYCOLAX) 17 g packet Take 17 g by mouth 2 (two) times daily.  . [DISCONTINUED] acetaminophen (TYLENOL) 500 MG tablet Take 500-1,000 mg by mouth every 6 (six) hours as needed for moderate pain.  . [DISCONTINUED] feeding supplement, ENSURE ENLIVE, (ENSURE ENLIVE) LIQD Take 237 mLs by mouth 2 (two) times daily between meals.     Allergies:   Codeine   Social History   Tobacco Use  . Smoking status: Never Smoker  . Smokeless tobacco: Never Used  Vaping Use  . Vaping Use: Never used  Substance Use Topics  . Alcohol use: No  . Drug use: No     Family History: The patient's  family history includes Heart failure in her father and mother; Hypertension in her father and mother.  ROS:   Please see the history of present illness.    All other systems reviewed and are negative.  EKGs/Labs/Other Studies Reviewed:    The following studies were reviewed today:  EKG:  SR with sinus arrhythmia, possible LAE  Recent Labs: 10/24/2019: ALT 35 10/29/2019: Magnesium 1.7 11/02/2019: BUN <5; Creatinine, Ser 0.51;  Hemoglobin 9.7; Platelets 429; Potassium 3.6; Sodium 140  Recent Lipid Panel    Component Value Date/Time   CHOL 179 10/29/2019 0307   TRIG 158 (H) 10/29/2019 0307   HDL 35 (L) 10/29/2019 0307   CHOLHDL 5.1 10/29/2019 0307   VLDL 32 10/29/2019 0307   LDLCALC 112 (H) 10/29/2019 0307    Physical Exam:    VS:  BP (!) 144/85   Pulse 66   Ht 5\' 1"  (1.549 m)   Wt 116 lb 6.4 oz (52.8 kg)   BMI 21.99 kg/m     Wt Readings from Last 5 Encounters:  06/01/20 116 lb 6.4 oz (52.8 kg)  11/17/19 115 lb 6.4 oz (52.3 kg)  11/03/19 116 lb 4.8 oz (52.8 kg)  10/12/19 120 lb 6.4 oz (54.6 kg)  10/06/19 126 lb 1.7 oz (57.2 kg)    Constitutional: No acute distress Eyes: sclera non-icteric, normal conjunctiva and lids ENMT: normal dentition, moist mucous membranes Cardiovascular: regular rhythm, normal rate, no murmurs. S1 and S2 normal. Radial pulses normal bilaterally. No jugular venous distention.  Respiratory: clear to auscultation bilaterally GI : normal bowel sounds, soft and nontender. No distention.   MSK: extremities warm, well perfused. No edema.  NEURO: grossly nonfocal exam, moves all extremities. PSYCH: alert and oriented x 3, normal mood and affect.   ASSESSMENT:    1. Coronary artery calcification   2. Hyperlipidemia, unspecified hyperlipidemia type   3. Medication management    PLAN:    Taking atorvastatin 40 mg MWF due to myalgias. Will check CMP and lipids and determine next steps.   Continue ASA 81 mg daily for CAC.  BP mildly elevated today but usually in normal range. She will track at home.   Total time of encounter: 30 minutes total time of encounter, including 20 minutes spent in face-to-face patient care on the date of this encounter. This time includes coordination of care and counseling regarding above mentioned problem list. Remainder of non-face-to-face time involved reviewing chart documents/testing relevant to the patient encounter and documentation in the  medical record. I have independently reviewed documentation from referring provider.   Cherlynn Kaiser, MD [place]  [facility]    Medication Adjustments/Labs and Tests Ordered: Current medicines are reviewed at length with the patient today.  Concerns regarding medicines are outlined above.   Orders Placed This Encounter  Procedures  . Comprehensive metabolic panel  . Lipid panel  . EKG 12-Lead    No orders of the defined types were placed in this encounter.   Patient Instructions  Medication Instructions:  No Changes In Medications at this time.  *If you need a refill on your cardiac medications before your next appointment, please call your pharmacy*  Lab Work: [place] If you have labs (blood work) drawn today and your tests are completely normal, you will receive your results only by: Marland Kitchen MyChart Message (if you have MyChart) OR . A paper copy in the mail If you have any lab test that is abnormal or we need to change  your treatment, we will call you to review the results.  Follow-Up: At [facility], you and your health needs are our priority.  As part of our continuing mission to provide you with exceptional heart care, we have created designated Provider Care Teams.  These Care Teams include your primary Cardiologist (physician) and Advanced Practice Providers (APPs -  Physician Assistants and Nurse Practitioners) who all work together to provide you with the care you need, when you need it.  Your next appointment:   6 month(s)  The format for your next appointment:   In Person  Provider:   Cherlynn Kaiser, MD  Other Instructions PLEASE CONTACT OUR OFFICE [phone] in 1 MONTH IF YOUR BLOOD PRESSURE IS CONSISTENTLY OVER 140/90. PLEASE WRITE BLOOD PRESSURES DOWN DAILY ON LOG PROVIDED.

## 2020-06-20 NOTE — Progress Notes (Signed)
06/21/2020 Kelly Harrington [birth year] [id number]   HPI:  Kelly Harrington is a 75 y.o. female patient of Dr Jacques Navy, who presents today for a lipid clinic evaluation.  Her medical history is significant only for a prior recurrent small bowel obstruction.  A prior CT showed aortic artery calcifications.  She has no history of MI, stroke or cardiac catheterization.  She re-started atorvastatin in late summer after being off for months due to obstruction.  She admits that when she re-started, she wasn't taking three times per week, only once or twice.  It was mid-November before she consistently took it 3 times per week.  Labs were drawn about 3-4 weeks after starting this regimen.    Current Medications: atorvastatin 40 mg MWF  Cholesterol Goals: LDL < 100   Intolerant/previously tried: atorvastatin 40 mg qd - cramping in legs muscles  Pravastatin - not strong enough, rosuvastatin daily thinks caused some muscle issues  Family history: father died at 11, had MI in his 62's; mother had MI in late 59's, still living at 57; brothers all healthy, one with high cholesterol; no children  Diet: rarely eats out; good balance of vegetables; had bowel blockage, was on liquid diet for much of 23,   Exercise:  Walks on treadmill 5 times per week, about 45-60 minutes  Labs:  06/01/2020:  TC 251, TG 113, HDL 88, LDL 144 (atorvastatin 40 mg MWF)   Current Outpatient Medications  Medication Sig Dispense Refill  . atorvastatin (LIPITOR) 40 MG tablet Take 1 tablet by mouth 4-5 days per week as tolerated 60 tablet 3  . aspirin 81 MG chewable tablet 1 tablet    . Biotin 1 MG CAPS Take 1 capsule by mouth daily.    . cetirizine (ZYRTEC) 10 MG tablet Take 10 mg by mouth daily as needed for allergies.    . cholecalciferol (VITAMIN D3) 25 MCG (1000 UNIT) tablet Take 1,000 Units by mouth daily.    . diclofenac (VOLTAREN) 75 MG EC tablet 1 tablet with food or milk    . Multiple Minerals-Vitamins (CALCIUM  CITRATE PLUS) TABS See admin instructions.    . Multiple Vitamin (MULTIVITAMIN WITH MINERALS) TABS tablet Take 1 tablet by mouth daily.    . Multiple Vitamins-Minerals (MULTI FOR HER 50+) TABS 1 tablet    . polyethylene glycol (MIRALAX / GLYCOLAX) 17 g packet Take 17 g by mouth 2 (two) times daily. 14 each 0   No current facility-administered medications for this visit.    Allergies  Allergen Reactions  . Codeine Nausea And Vomiting    Past Medical History:  Diagnosis Date  . High cholesterol   . SBO (small bowel obstruction) (HCC)     There were no vitals taken for this visit.   Hyperlipidemia Patient with elevated LDL, but no prior cardiac events.  She did have ASCVD noted on prior CT, however this was an incidental finding, and did not have a calcium score.  Because of that, insurance will not approve use of PCSK-9 inhibitors or bempedoic acid.  Discussed options with patient for trying different statin (rosuvastatin 3-4 days/week) and adding ezetimibe.  Because her labs were drawn after just 4 weeks of regular atorvastatin use, she would like to continue that and maybe try to increase the dose some.  Suggested she go to an every other day use for the atorvastatin and we will repeat labs in 2 months.  At that time we can consider increasing dose to 4-5 times each week  if she is doing well or adding ezetimibe.  Patient agreeable to this plan.     Tommy Medal PharmD CPP [facility] [address] [place], [place] [address] [phone]

## 2020-06-21 ENCOUNTER — Ambulatory Visit (INDEPENDENT_AMBULATORY_CARE_PROVIDER_SITE_OTHER): Payer: Medicare Other | Admitting: Pharmacist Clinician (PhC)/ Clinical Pharmacy Specialist

## 2020-06-21 ENCOUNTER — Other Ambulatory Visit: Payer: Self-pay

## 2020-06-21 ENCOUNTER — Encounter: Payer: Self-pay | Admitting: Pharmacist Clinician (PhC)/ Clinical Pharmacy Specialist

## 2020-06-21 DIAGNOSIS — E785 Hyperlipidemia, unspecified: Secondary | ICD-10-CM | POA: Insufficient documentation

## 2020-06-21 MED ORDER — ATORVASTATIN CALCIUM 40 MG PO TABS: ORAL_TABLET | ORAL | 3 refills | Status: DC

## 2020-06-21 NOTE — Patient Instructions (Signed)
Your Results:             Your most recent labs Goal  Total Cholesterol 251 < 200  Triglycerides 113 < 150  HDL (happy/good cholesterol) 88 > 40  LDL (lousy/bad cholesterol 144 < 100   Medication changes:  Increase atorvastatin to 40 mg every other day.    We will repeat labs in early March to see if you are at the LDL goal.  If not at goal at that time we can consider adding ezetimibe (Zetia)   Thank you for choosing [facility]

## 2020-06-21 NOTE — Assessment & Plan Note (Signed)
Patient with elevated LDL, but no prior cardiac events.  She did have ASCVD noted on prior CT, however this was an incidental finding, and did not have a calcium score.  Because of that, insurance will not approve use of PCSK-9 inhibitors or bempedoic acid.  Discussed options with patient for trying different statin (rosuvastatin 3-4 days/week) and adding ezetimibe.  Because her labs were drawn after just 4 weeks of regular atorvastatin use, she would like to continue that and maybe try to increase the dose some.  Suggested she go to an every other day use for the atorvastatin and we will repeat labs in 2 months.  At that time we can consider increasing dose to 4-5 times each week if she is doing well or adding ezetimibe.  Patient agreeable to this plan.

## 2020-08-09 DIAGNOSIS — H43811 Vitreous degeneration, right eye: Secondary | ICD-10-CM | POA: Diagnosis not present

## 2020-08-09 DIAGNOSIS — H25813 Combined forms of age-related cataract, bilateral: Secondary | ICD-10-CM | POA: Diagnosis not present

## 2020-08-30 DIAGNOSIS — H6123 Impacted cerumen, bilateral: Secondary | ICD-10-CM | POA: Diagnosis not present

## 2020-08-30 DIAGNOSIS — H903 Sensorineural hearing loss, bilateral: Secondary | ICD-10-CM | POA: Diagnosis not present

## 2020-09-02 DIAGNOSIS — E785 Hyperlipidemia, unspecified: Secondary | ICD-10-CM | POA: Diagnosis not present

## 2020-09-02 LAB — LIPID PANEL
Chol/HDL Ratio: 3.2 ratio (ref 0.0–4.4)
Cholesterol, Total: 241 mg/dL — ABNORMAL HIGH (ref 100–199)
HDL: 76 mg/dL (ref >39)
LDL Chol Calc (NIH): 148 mg/dL — ABNORMAL HIGH (ref 0–99)
Triglycerides: 98 mg/dL (ref 0–149)
VLDL Cholesterol Cal: 17 mg/dL (ref 5–40)

## 2020-09-02 LAB — HEPATIC FUNCTION PANEL
ALT: 14 IU/L (ref 0–32)
AST: 25 IU/L (ref 0–40)
Albumin: 4.5 g/dL (ref 3.7–4.7)
Alkaline Phosphatase: 42 IU/L — ABNORMAL LOW (ref 44–121)
Bilirubin Total: 0.8 mg/dL (ref 0.0–1.2)
Bilirubin, Direct: 0.17 mg/dL (ref 0.00–0.40)
Total Protein: 7.2 g/dL (ref 6.0–8.5)

## 2020-09-21 ENCOUNTER — Other Ambulatory Visit: Payer: Self-pay

## 2020-09-21 MED ORDER — ATORVASTATIN CALCIUM 40 MG PO TABS: 40.0000 mg | ORAL_TABLET | Freq: Every day | ORAL | 3 refills | Status: DC

## 2020-10-27 DIAGNOSIS — Z0000 Encounter for general adult medical examination without abnormal findings: Secondary | ICD-10-CM | POA: Diagnosis not present

## 2020-11-30 NOTE — Progress Notes (Signed)
Cardiology Office Note:    Date:  12/02/2020   ID:  Kelly Harrington, DOB 08-Aug-1945, MRN [id number]  PCP:  Alroy Dust, Carlean Jews.Marlou Sa, MD  Cardiologist:  Elouise Munroe, MD  Electrophysiologist:  None   Referring MD: Aurea Graff.Marlou Sa, MD   Chief Complaint/Reason for Referral: Follow up coronary artery calcifications  History of Present Illness:    Kelly Harrington is a 75 y.o. female with a history of acute appendicitis with laparoscopic appendectomy 10/06/2019 with Dr. 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  She is very active at baseline, has no cardiopulmonary symptoms, and for abnormal ECG we obtain an echocardiogram which was unremarkable for structural issues.  While in hospital she was noted to have a small bowel obstruction requiring NG tube placement.  11/17/19 - we started asa 81 mg daily and recommended resuming atorvastatin. She was on atorvastatin 40 mg MWF, due to myalgias with daily dosing. 06/01/20 - still on MWF dosing, we discussed going to daily dosing. She had one episode recently of right sided chest pressure without inciting factors, not related to activity. This has been an isolated episode.  Today, she is feeling good overall. She is recovering well. When she first started taking 40 mg atorvastatin daily, she developed bilateral LE muscle cramps. However, the cramping has resolved at this time. In 08/2020 she was taking atorvastatin daily but sporadically. She has made an effort to be much more consistent lately. We discussed adding Zetia.  For her diet, she typically drinks some caffeine. She has not noticed feeling any palpitations with note of PACs on ECG today. At home, her blood pressure is typically averaging in the 381'O systolic. Most of her salt intake  comes from salty snacks, and she is trying to eliminate this and eat more fruit for the benefit of her BP.  For exercise, she continues walking on her treadmill at least 5 days a week 45 mins. She has also started Youtube exercises 3 days a week. When she exercises, she notices her heart rate may raise to the 120s. She asks if this is a dangerous level for her age. I advised that her optimum aerobic heart rate should fall between 110-130 bpm for her age.  She denies any chest pain, shortness of breath, or exertional symptoms. No headaches, lightheadedness, or syncope to report. Also has no lower extremity edema, orthopnea or PND.   Past Medical History:  Diagnosis Date   High cholesterol    SBO (small bowel obstruction) (HCC)     Past Surgical History:  Procedure Laterality Date   APPENDECTOMY     LAPAROSCOPIC APPENDECTOMY N/A 10/06/2019   Procedure: APPENDECTOMY LAPAROSCOPIC;  Surgeon: Autumn Messing III, MD;  Location: [facility];  Service: General;  Laterality: N/A;   ROTATOR CUFF REPAIR     TRIGGER FINGER RELEASE      Current Medications: Current Meds  Medication Sig   aspirin 81 MG chewable tablet 1 tablet   atorvastatin (LIPITOR) 40 MG tablet Take 1 tablet (40 mg total) by mouth daily.   Biotin 1 MG CAPS Take 1 capsule by mouth daily.   cetirizine (ZYRTEC) 10 MG tablet Take 10 mg by mouth daily as needed for allergies.   cholecalciferol (VITAMIN D3) 25 MCG (1000 UNIT) tablet Take 1,000 Units by mouth daily.   diclofenac (VOLTAREN) 75 MG EC tablet  Take 75 mg by mouth as needed.   Multiple Minerals-Vitamins (CALCIUM CITRATE PLUS) TABS See admin instructions.   Multiple Vitamin (MULTIVITAMIN WITH MINERALS) TABS tablet Take 1 tablet by mouth daily.   polyethylene glycol (MIRALAX / GLYCOLAX) 17 g packet Take 17 g by mouth daily.     Allergies:   Codeine   Social History   Tobacco Use   Smoking status: Never   Smokeless tobacco: Never  Vaping Use   Vaping Use: Never used  Substance  Use Topics   Alcohol use: No   Drug use: No     Family History: The patient's family history includes Heart failure in her father and mother; Hypertension in her father and mother.  ROS:   Please see the history of present illness.    All other systems reviewed and are negative.  EKGs/Labs/Other Studies Reviewed:    The following studies were reviewed today:  Echo 10/28/2019:  1. Left ventricular ejection fraction, by estimation, is 70 to 75%. The  left ventricle has hyperdynamic function. The left ventricle has no  regional wall motion abnormalities. There is mild concentric left  ventricular hypertrophy. Left ventricular  diastolic parameters are consistent with Grade I diastolic dysfunction  (impaired relaxation).   2. Right ventricular systolic function is normal. The right ventricular  size is normal.   3. The mitral valve is normal in structure. No evidence of mitral valve  regurgitation. No evidence of mitral stenosis.   4. The aortic valve is normal in structure. Aortic valve regurgitation is  not visualized. No aortic stenosis is present.   5. The inferior vena cava is normal in size with greater than 50%  respiratory variability, suggesting right atrial pressure of 3 mmHg.  EKG:   12/02/2020: SR with PAC's, rate 65 bpm 06/01/2020: SR with sinus arrhythmia, possible LAE  Recent Labs: 06/01/2020: BUN 7; Creatinine, Ser 0.72; Potassium 4.1; Sodium 140 09/02/2020: ALT 14  Recent Lipid Panel    Component Value Date/Time   CHOL 241 (H) 09/02/2020 0814   TRIG 98 09/02/2020 0814   HDL 76 09/02/2020 0814   CHOLHDL 3.2 09/02/2020 0814   CHOLHDL 5.1 10/29/2019 0307   VLDL 32 10/29/2019 0307   LDLCALC 148 (H) 09/02/2020 0814    Physical Exam:    VS:  BP (!) 142/80   Pulse 65   Ht 5\' 1"  (1.549 m)   Wt 125 lb 9.6 oz (57 kg)   BMI 23.73 kg/m     Wt Readings from Last 5 Encounters:  12/02/20 125 lb 9.6 oz (57 kg)  06/01/20 116 lb 6.4 oz (52.8 kg)  11/17/19 115 lb  6.4 oz (52.3 kg)  11/03/19 116 lb 4.8 oz (52.8 kg)  10/12/19 120 lb 6.4 oz (54.6 kg)    Constitutional: No acute distress Eyes: sclera non-icteric, normal conjunctiva and lids ENMT: normal dentition, moist mucous membranes Cardiovascular: regular rhythm, normal rate, no murmurs. S1 and S2 normal. Radial pulses normal bilaterally. No jugular venous distention.  Respiratory: clear to auscultation bilaterally GI : normal bowel sounds, soft and nontender. No distention.   MSK: extremities warm, well perfused. No edema.  NEURO: grossly nonfocal exam, moves all extremities. PSYCH: alert and oriented x 3, normal mood and affect.   ASSESSMENT:    1. Hyperlipidemia, unspecified hyperlipidemia type   2. Coronary artery calcification   3. Essential hypertension     PLAN:    Taking atorvastatin 40 mg daily now. After discussion, she would prefer to check her  cholesterol in 3 months and at that time if still elevated we will discuss adding Zetia.  She notes she has been working on her diet, and deferring lab work would give her more time.  We discussed CT coronary calcium scoring for quantitation of calcifications, which may help guide [place] in intensity of therapy. Continue ASA 81 mg daily for CAC.  Her blood pressure has been mildly elevated on serial checks.  She feels she is very salt sensitive and will continue to try to cut this out of her diet.  In 3 months time we will recheck her blood pressure and if elevated at that time I would consider a low-dose of hydrochlorothiazide, spironolactone, or ARB to better control the blood pressure for overall health maintenance.  Total time of encounter: 30 minutes total time of encounter, including 20 minutes spent in face-to-face patient care on the date of this encounter. This time includes coordination of care and counseling regarding above mentioned problem list. Remainder of non-face-to-face time involved reviewing chart documents/testing relevant to the  patient encounter and documentation in the medical record. I have independently reviewed documentation from referring provider.   Cherlynn Kaiser, MD [place]  [facility]    Medication Adjustments/Labs and Tests Ordered: Current medicines are reviewed at length with the patient today.  Concerns regarding medicines are outlined above.   Orders Placed This Encounter  Procedures   Lipid panel     No orders of the defined types were placed in this encounter.   Patient Instructions  Medication Instructions:  No changes  *If you need a refill on your cardiac medications before your next appointment, please call your pharmacy*   Lab Work: Lipids in 3 month fasting  If you have labs (blood work) drawn today and your tests are completely normal, you will receive your results only by: [place] (if you have MyChart) OR A paper copy in the mail If you have any lab test that is abnormal or we need to change your treatment, we will call you to review the results.   Testing/Procedures:  Not needed  Follow-Up: At [facility], you and your health needs are our priority.  As part of our continuing mission to provide you with exceptional heart care, we have created designated Provider Care Teams.  These Care Teams include your primary Cardiologist (physician) and Advanced Practice Providers (APPs -  Physician Assistants and Nurse Practitioners) who all work together to provide you with the care you need, when you need it.     Your next appointment:   3 month(s)  The format for your next appointment:   In Person  Provider:   Cherlynn Kaiser, MD       [facility],acting as a scribe for Elouise Munroe, MD.,have documented all relevant documentation on the behalf of Elouise Munroe, MD,as directed by  Elouise Munroe, MD while in the presence of Elouise Munroe, MD.  I, Elouise Munroe, MD, have reviewed all documentation for this visit. The  documentation on 12/02/20 for the exam, diagnosis, procedures, and orders are all accurate and complete.

## 2020-12-02 ENCOUNTER — Ambulatory Visit: Payer: Medicare Other | Admitting: Internal Medicine

## 2020-12-02 ENCOUNTER — Other Ambulatory Visit: Payer: Self-pay | Admitting: *Deleted

## 2020-12-02 ENCOUNTER — Other Ambulatory Visit: Payer: Self-pay

## 2020-12-02 VITALS — BP 142/80 | HR 65 | Ht 61.0 in | Wt 125.6 lb

## 2020-12-02 DIAGNOSIS — I2584 Coronary atherosclerosis due to calcified coronary lesion: Secondary | ICD-10-CM | POA: Diagnosis not present

## 2020-12-02 DIAGNOSIS — E785 Hyperlipidemia, unspecified: Secondary | ICD-10-CM

## 2020-12-02 DIAGNOSIS — I2510 Atherosclerotic heart disease of native coronary artery without angina pectoris: Secondary | ICD-10-CM | POA: Diagnosis not present

## 2020-12-02 DIAGNOSIS — I10 Essential (primary) hypertension: Secondary | ICD-10-CM | POA: Diagnosis not present

## 2020-12-02 NOTE — Patient Instructions (Signed)
Medication Instructions:  No changes  *If you need a refill on your cardiac medications before your next appointment, please call your pharmacy*   Lab Work: Lipids in 3 month fasting  If you have labs (blood work) drawn today and your tests are completely normal, you will receive your results only by: [place] (if you have MyChart) OR A paper copy in the mail If you have any lab test that is abnormal or we need to change your treatment, we will call you to review the results.   Testing/Procedures:  Not needed  Follow-Up: At [facility], you and your health needs are our priority.  As part of our continuing mission to provide you with exceptional heart care, we have created designated Provider Care Teams.  These Care Teams include your primary Cardiologist (physician) and Advanced Practice Providers (APPs -  Physician Assistants and Nurse Practitioners) who all work together to provide you with the care you need, when you need it.     Your next appointment:   3 month(s)  The format for your next appointment:   In Person  Provider:   Cherlynn Kaiser, MD

## 2020-12-08 NOTE — Addendum Note (Signed)
Addended by: Jacqulynn Cadet on: 12/08/2020 08:49 AM   Modules accepted: Orders

## 2021-01-11 DIAGNOSIS — Z20822 Contact with and (suspected) exposure to covid-19: Secondary | ICD-10-CM | POA: Diagnosis not present

## 2021-01-17 ENCOUNTER — Ambulatory Visit: Payer: Medicare Other

## 2021-02-10 ENCOUNTER — Other Ambulatory Visit (HOSPITAL_BASED_OUTPATIENT_CLINIC_OR_DEPARTMENT_OTHER): Payer: Self-pay

## 2021-02-10 ENCOUNTER — Ambulatory Visit: Payer: Medicare Other | Attending: Internal Medicine

## 2021-02-10 DIAGNOSIS — Z23 Encounter for immunization: Secondary | ICD-10-CM

## 2021-02-10 MED ORDER — PFIZER-BIONT COVID-19 VAC-TRIS 30 MCG/0.3ML IM SUSP
INTRAMUSCULAR | 0 refills | Status: DC
  Filled 2021-02-10: qty 0.3, 1d supply, fill #0

## 2021-02-10 NOTE — Progress Notes (Signed)
   Covid-19 Vaccination Clinic  Name:  [name]    MRN: [id number] DOB: 29-Oct-1945  02/10/2021  Ms. Likes was observed post Covid-19 immunization for 15 minutes without incident. She was provided with Vaccine Information Sheet and instruction to access the V-Safe system.   [name] was instructed to call 911 with any severe reactions post vaccine: Difficulty breathing  Swelling of face and throat  A fast heartbeat  A bad rash all over body  Dizziness and weakness   Immunizations Administered     Name Date Dose VIS Date Route   PFIZER Comrnaty(Gray TOP) Covid-19 Vaccine 02/10/2021  9:25 AM 0.3 mL 05/26/2020 Intramuscular   Manufacturer: [employer]   Lot: [id number]   NDC: [phone]

## 2021-02-27 DIAGNOSIS — E785 Hyperlipidemia, unspecified: Secondary | ICD-10-CM | POA: Diagnosis not present

## 2021-02-27 LAB — LIPID PANEL
Chol/HDL Ratio: 2.7 ratio (ref 0.0–4.4)
Cholesterol, Total: 218 mg/dL — ABNORMAL HIGH (ref 100–199)
HDL: 80 mg/dL (ref >39)
LDL Chol Calc (NIH): 118 mg/dL — ABNORMAL HIGH (ref 0–99)
Triglycerides: 114 mg/dL (ref 0–149)
VLDL Cholesterol Cal: 20 mg/dL (ref 5–40)

## 2021-03-02 ENCOUNTER — Other Ambulatory Visit: Payer: Self-pay

## 2021-03-02 DIAGNOSIS — E785 Hyperlipidemia, unspecified: Secondary | ICD-10-CM

## 2021-03-02 MED ORDER — EZETIMIBE 10 MG PO TABS: 10.0000 mg | ORAL_TABLET | Freq: Every day | ORAL | 3 refills | Status: DC

## 2021-03-06 ENCOUNTER — Ambulatory Visit: Payer: Medicare Other | Admitting: Internal Medicine

## 2021-03-06 ENCOUNTER — Other Ambulatory Visit: Payer: Self-pay

## 2021-03-06 ENCOUNTER — Encounter: Payer: Self-pay | Admitting: Internal Medicine

## 2021-03-06 VITALS — BP 142/82 | HR 72 | Ht 61.0 in | Wt 132.4 lb

## 2021-03-06 DIAGNOSIS — E785 Hyperlipidemia, unspecified: Secondary | ICD-10-CM | POA: Diagnosis not present

## 2021-03-06 DIAGNOSIS — I2510 Atherosclerotic heart disease of native coronary artery without angina pectoris: Secondary | ICD-10-CM | POA: Diagnosis not present

## 2021-03-06 DIAGNOSIS — I2584 Coronary atherosclerosis due to calcified coronary lesion: Secondary | ICD-10-CM | POA: Diagnosis not present

## 2021-03-06 DIAGNOSIS — I10 Essential (primary) hypertension: Secondary | ICD-10-CM | POA: Diagnosis not present

## 2021-03-06 DIAGNOSIS — Z79899 Other long term (current) drug therapy: Secondary | ICD-10-CM | POA: Diagnosis not present

## 2021-03-06 NOTE — Patient Instructions (Addendum)
Medication Instructions:  No Changes In Medications at this time.  *If you need a refill on your cardiac medications before your next appointment, please call your pharmacy*  LABS LIPID BLOOD WORK IN 3 MONTHS. PLEASE RETURN FOR THIS   Follow-Up: At [facility], you and your health needs are our priority.  As part of our continuing mission to provide you with exceptional heart care, we have created designated Provider Care Teams.  These Care Teams include your primary Cardiologist (physician) and Advanced Practice Providers (APPs -  Physician Assistants and Nurse Practitioners) who all work together to provide you with the care you need, when you need it.   Your next appointment:   3 month(s)  The format for your next appointment:   In Person  Provider:   CALLIE GOODRICH, PA-C or HAO MENG PA-C   Other Instructions BLOOD PRESSURE MEDICATION OPTIONS- AMLODIPINE 5mg  DAILY, HYDROCHLOROTHIAZIDE 12.5mg  DAILY, OR SPIRONOLACTONE 25mg  DAILY

## 2021-03-06 NOTE — Progress Notes (Signed)
Cardiology Office Note:    Date:  03/06/2021   ID:  Kelly Harrington, DOB 11/05/1945, MRN [id number]  PCP:  Kelly Harrington, Kelly Harrington.Kelly Sa, MD  Cardiologist:  Elouise Munroe, MD  Electrophysiologist:  None   Referring MD: Kelly Harrington.Kelly Sa, MD   Chief Complaint/Reason for Referral: Follow up coronary artery calcifications  History of Present Illness:    Kelly Harrington is a 75 y.o. female with a history of acute appendicitis with laparoscopic appendectomy 10/06/2019 with Dr. 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  She is very active at baseline, has no cardiopulmonary symptoms, and for abnormal ECG we obtain an echocardiogram which was unremarkable for structural issues.  While in hospital she was noted to have a small bowel obstruction requiring NG tube placement.  She contracted COVID in early July but feels she has made a full recovery and only had moderate cold symptoms with fatigue and mild shortness of breath.  She has been doing well with atorvastatin 40 mg daily and only forgets to take it every once in a while when she goes to take care of her mother.  We discussed improved lipid panel but not quite at goal and after review of her labs we discussed starting Zetia 10 mg daily.  Prescription has been called in and this will arrive to her house soon.  She denies chest pain or shortness of breath.  She has not been able to walk as much lately since her outdoor walking friend has been unable to walk with her and her treadmill at home broke.  We discussed getting back to her walking regimen which she was so diligently doing for years prior to her hospitalization last year.  The patient denies chest pain, chest pressure, dyspnea at rest or with exertion, palpitations, PND, orthopnea, or leg  swelling. Denies cough, fever, chills. Denies nausea, vomiting. Denies syncope or presyncope. Denies dizziness or lightheadedness.    11/17/19 - we started asa 81 mg daily and recommended resuming atorvastatin. She was on atorvastatin 40 mg MWF, due to myalgias with daily dosing. 06/01/20 - still on MWF dosing, we discussed going to daily dosing. She had one episode recently of right sided chest pressure without inciting factors, not related to activity. This has been an isolated episode. 12/02/20 - When she first started taking 40 mg atorvastatin daily, she developed bilateral LE muscle cramps. However, the cramping has resolved at this time. In 08/2020 she was taking atorvastatin daily but sporadically. She has made an effort to be much more consistent lately. We discussed adding Zetia. For her diet, she typically drinks some caffeine. She has not noticed feeling any palpitations with note of PACs on ECG today. At home, her blood pressure is typically averaging in the [id number] systolic. Most of her salt intake comes from salty snacks, and she is trying to eliminate this and eat more fruit for the benefit of her BP. For exercise, she continues walking on her treadmill at least 5 days a week 45 mins. She has also started Youtube exercises 3 days a week. When she exercises, she notices her heart rate may raise to the 120s. She asks if this is a dangerous level for her age. I advised that her optimum aerobic heart rate should fall between 110-130 bpm for her age.  Past Medical History:  Diagnosis Date  High cholesterol    SBO (small bowel obstruction) ([place])     Past Surgical History:  Procedure Laterality Date   APPENDECTOMY     LAPAROSCOPIC APPENDECTOMY N/A 10/06/2019   Procedure: APPENDECTOMY LAPAROSCOPIC;  Surgeon: Kelly Messing III, MD;  Location: [facility];  Service: General;  Laterality: N/A;   ROTATOR CUFF REPAIR     TRIGGER FINGER RELEASE      Current Medications: Current Meds  Medication Sig    aspirin 81 MG chewable tablet 1 tablet   atorvastatin (LIPITOR) 40 MG tablet Take 1 tablet (40 mg total) by mouth daily.   Biotin 1 MG CAPS Take 1 capsule by mouth daily.   cetirizine (ZYRTEC) 10 MG tablet Take 10 mg by mouth daily as needed for allergies.   cholecalciferol (VITAMIN D3) 25 MCG (1000 UNIT) tablet Take 1,000 Units by mouth daily.   COVID-19 mRNA Vac-TriS, Pfizer, (PFIZER-BIONT COVID-19 VAC-TRIS) SUSP injection Inject into the muscle.   diclofenac (VOLTAREN) 75 MG EC tablet Take 75 mg by mouth as needed.   ezetimibe (ZETIA) 10 MG tablet Take 1 tablet (10 mg total) by mouth daily.   Multiple Minerals-Vitamins (CALCIUM CITRATE PLUS) TABS See admin instructions.   Multiple Vitamin (MULTIVITAMIN WITH MINERALS) TABS tablet Take 1 tablet by mouth daily.   polyethylene glycol (MIRALAX / GLYCOLAX) 17 g packet Take 17 g by mouth daily.     Allergies:   Codeine   Social History   Tobacco Use   Smoking status: Never   Smokeless tobacco: Never  Vaping Use   Vaping Use: Never used  Substance Use Topics   Alcohol use: No   Drug use: No     Family History: The patient's family history includes Heart failure in her father and mother; Hypertension in her father and mother.  ROS:   Please see the history of present illness.    All other systems reviewed and are negative.  EKGs/Labs/Other Studies Reviewed:    The following studies were reviewed today:  Echo 10/28/2019:  1. Left ventricular ejection fraction, by estimation, is 70 to 75%. The  left ventricle has hyperdynamic function. The left ventricle has no  regional wall motion abnormalities. There is mild concentric left  ventricular hypertrophy. Left ventricular  diastolic parameters are consistent with Grade I diastolic dysfunction  (impaired relaxation).   2. Right ventricular systolic function is normal. The right ventricular  size is normal.   3. The mitral valve is normal in structure. No evidence of mitral valve   regurgitation. No evidence of mitral stenosis.   4. The aortic valve is normal in structure. Aortic valve regurgitation is  not visualized. No aortic stenosis is present.   5. The inferior vena cava is normal in size with greater than 50%  respiratory variability, suggesting right atrial pressure of 3 mmHg.  EKG: None performed today 12/02/2020: SR with PAC's, rate 65 bpm 06/01/2020: SR with sinus arrhythmia, possible LAE  Recent Labs: 06/01/2020: BUN 7; Creatinine, Ser 0.72; Potassium 4.1; Sodium 140 09/02/2020: ALT 14  Recent Lipid Panel    Component Value Date/Time   CHOL 218 (H) 02/27/2021 0908   TRIG 114 02/27/2021 0908   HDL 80 02/27/2021 0908   CHOLHDL 2.7 02/27/2021 0908   CHOLHDL 5.1 10/29/2019 0307   VLDL 32 10/29/2019 0307   LDLCALC 118 (H) 02/27/2021 0908    Physical Exam:    VS:  BP (!) 142/82   Pulse 72   Ht '5\' 1"'$  (1.549 m)   Wt  132 lb 6.4 oz (60.1 kg)   SpO2 97%   BMI 25.02 kg/m     Wt Readings from Last 5 Encounters:  03/06/21 132 lb 6.4 oz (60.1 kg)  12/02/20 125 lb 9.6 oz (57 kg)  06/01/20 116 lb 6.4 oz (52.8 kg)  11/17/19 115 lb 6.4 oz (52.3 kg)  11/03/19 116 lb 4.8 oz (52.8 kg)    Constitutional: No acute distress Eyes: sclera non-icteric, normal conjunctiva and lids ENMT: normal dentition, moist mucous membranes Cardiovascular: regular rhythm, normal rate, no murmurs. S1 and S2 normal. Radial pulses normal bilaterally. No jugular venous distention.  Respiratory: clear to auscultation bilaterally GI : normal bowel sounds, soft and nontender. No distention.   MSK: extremities warm, well perfused. No edema.  NEURO: grossly nonfocal exam, moves all extremities. PSYCH: alert and oriented x 3, normal mood and affect.   ASSESSMENT:    1. Coronary artery calcification   2. Hyperlipidemia, unspecified hyperlipidemia type   3. Essential hypertension   4. Medication management      PLAN:    Coronary artery calcifications  Hyperlipidemia   Medication management  -recommend adding Zetia 10 mg daily today. Taking atorvastatin 40 mg daily now.  Goal LDL is less than 70, if close to that range at our next check, can discuss maintaining current therapy.  She had myalgias with up titration of atorvastatin, we have discussed not increasing to 80 mg unless absolutely necessary. - Continue ASA 81 mg daily for CAC.  Hypertension  -Her blood pressure remains above goal on serial evaluations in our office.  We discussed this at her last visit and she was attempting to modify her diet.  Since we are starting Zetia today she would not like to start an additional medication but would like to focus on diet lifestyle modification and in 3 months we will recheck blood pressure at an office visit when we recheck her lipids and if still above the goal of systolic less than [id number] and diastolic less than 80, I would recommend starting therapy.  She will likely see one of my colleagues at follow-up and we have discussed medication options today so that she can anticipate this change at her next visit.  Would recommend starting either amlodipine 5 mg daily, hydrochlorothiazide 12.5 mg daily, or spironolactone 25 mg daily.  Her family has had some intolerances to ARB and we discussed not using that first-line for her yet.  She will review these options prior to her next follow-up and can participate in shared decision-making with the APP she sees in follow-up.  Total time of encounter: 30 minutes total time of encounter, including 25 minutes spent in face-to-face patient care on the date of this encounter. This time includes coordination of care and counseling regarding above mentioned problem list. Remainder of non-face-to-face time involved reviewing chart documents/testing relevant to the patient encounter and documentation in the medical record. I have independently reviewed documentation from referring provider.   Cherlynn Kaiser, MD, [facility]     Medication Adjustments/Labs and Tests Ordered: Current medicines are reviewed at length with the patient today.  Concerns regarding medicines are outlined above.   No orders of the defined types were placed in this encounter.    No orders of the defined types were placed in this encounter.   Patient Instructions  Medication Instructions:  No Changes In Medications at this time.  *If you need a refill on your cardiac medications before your next appointment, please  call your pharmacy*  LABS LIPID BLOOD WORK IN 3 MONTHS. PLEASE RETURN FOR THIS   Follow-Up: At [facility], you and your health needs are our priority.  As part of our continuing mission to provide you with exceptional heart care, we have created designated Provider Care Teams.  These Care Teams include your primary Cardiologist (physician) and Advanced Practice Providers (APPs -  Physician Assistants and Nurse Practitioners) who all work together to provide you with the care you need, when you need it.   Your next appointment:   3 month(s)  The format for your next appointment:   In Person  Provider:   CALLIE GOODRICH, PA-C or HAO MENG PA-C   Other Instructions BLOOD PRESSURE MEDICATION OPTIONS- AMLODIPINE '5mg'$  DAILY, HYDROCHLOROTHIAZIDE 12.'5mg'$  DAILY, OR SPIRONOLACTONE '25mg'$  DAILY

## 2021-03-10 DIAGNOSIS — M25512 Pain in left shoulder: Secondary | ICD-10-CM | POA: Diagnosis not present

## 2021-05-05 DIAGNOSIS — Z1231 Encounter for screening mammogram for malignant neoplasm of breast: Secondary | ICD-10-CM | POA: Diagnosis not present

## 2021-05-19 ENCOUNTER — Other Ambulatory Visit (HOSPITAL_BASED_OUTPATIENT_CLINIC_OR_DEPARTMENT_OTHER): Payer: Self-pay

## 2021-05-19 ENCOUNTER — Ambulatory Visit: Payer: Medicare Other | Attending: Internal Medicine

## 2021-05-19 DIAGNOSIS — Z23 Encounter for immunization: Secondary | ICD-10-CM

## 2021-05-19 MED ORDER — PFIZER COVID-19 VAC BIVALENT 30 MCG/0.3ML IM SUSP
INTRAMUSCULAR | 0 refills | Status: DC
  Filled 2021-05-19: qty 0.3, 1d supply, fill #0

## 2021-05-19 NOTE — Progress Notes (Addendum)
Cardiology Office Note:    Date:  05/29/2021   ID:  Kelly Harrington, DOB 11-24-1945, MRN [id number]  PCP:  Alroy Dust, Carlean Jews.Marlou Sa, MD  Cardiologist:  Elouise Munroe, MD  Electrophysiologist:  None   Referring MD: Aurea Graff.Marlou Sa, MD   Chief Complaint: follow-up of hypetension  History of Present Illness:    Kelly Harrington is a 75 y.o. 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 Margaretann Loveless and presents today for follow-up of hypertension.   Patient was first seen by Cardiology during an admission in 10/2019 for small bowel obstruction and AKI. We were consulted for an abnormal EKG. Computer interpretation suggested accelerated junctional rhythm but on official read by Cardiology fel to be sinus rhythm. However, she did have some evidence of LVH and T wave inversions in lateral leads. Coronary calcifications could be seen in the LAD on abdominal/pelvic CT. Outpatient Echo was ordered and showed LVEF of 70-75% with mild LVH and grade 1 diastolic dysfunction but no significant valvular disease. We have mainly been following her for hypertension and hyperlipidemia since then.   Patient was last seen by Dr. Margaretann Loveless in 02/2021 at which time her BP was mildly elevated but she was doing well from a cardiac standpoint. Zetia was added to help get her LDL below 70. She did not want to start another new medications for her BP and preferred to focus on lifestyle modifications for 3 months with plans to start either Amlodipine 5mg  daily, HCTZ 12.5mg  daily, or Spironolactone 25mg  daily at next visit if BP still not at goal.  Patient presents today for follow-up. Here alone. Overall doing well since last visit. She does not a couple of episodes of very mild chest discomfort/pressure while at rest. She has about 2 episodes of  this - occurs at rest and last for a few minutes before resolving on her on. She stays active and walks or rides stationary bike for 1 hour at a time and denies any chest pain with this. No shortness of breath, orthopnea, PND, lower extremity edema, lightheadedness, dizziness, syncope.  BP borderline elevated at 132/78 today in the office; however, it has been high at home. Systolic BP occasional in the 110s but normally in the 130s to 160s at home.  Past Medical History:  Diagnosis Date   Coronary artery calcification    noted on CT scan in 10/2019   Hyperlipidemia    Hypertension    SBO (small bowel obstruction) ([facility])     Past Surgical History:  Procedure Laterality Date   APPENDECTOMY     LAPAROSCOPIC APPENDECTOMY N/A 10/06/2019   Procedure: APPENDECTOMY LAPAROSCOPIC;  Surgeon: Autumn Messing III, MD;  Location: [facility];  Service: General;  Laterality: N/A;   ROTATOR CUFF REPAIR     TRIGGER FINGER RELEASE      Current Medications: Current Meds  Medication Sig   aspirin 81 MG chewable tablet 1 tablet   atorvastatin (LIPITOR) 40 MG tablet Take 1 tablet (40 mg total) by mouth daily.   cetirizine (ZYRTEC) 10 MG tablet Take 10 mg by mouth daily as needed for allergies.   COVID-19 mRNA bivalent vaccine, Pfizer, (PFIZER COVID-19 VAC BIVALENT) injection Inject into the muscle.   COVID-19 mRNA Vac-TriS, Pfizer, (PFIZER-BIONT COVID-19 VAC-TRIS) SUSP injection Inject into the muscle.   diclofenac (VOLTAREN) 75 MG EC tablet Take 75 mg by  mouth as needed.   ezetimibe (ZETIA) 10 MG tablet Take 1 tablet (10 mg total) by mouth daily.   polyethylene glycol (MIRALAX / GLYCOLAX) 17 g packet Take 17 g by mouth daily.   [DISCONTINUED] amLODipine (NORVASC) 5 MG tablet Take 1 tablet (5 mg total) by mouth daily.   [DISCONTINUED] cholecalciferol (VITAMIN D3) 25 MCG (1000 UNIT) tablet Take 1,000 Units by mouth daily.     Allergies:   Codeine   Social History   Socioeconomic History   Marital status:  Married    Spouse name: Not on file   Number of children: Not on file   Years of education: Not on file   Highest education level: Not on file  Occupational History   Not on file  Tobacco Use   Smoking status: Never   Smokeless tobacco: Never  Vaping Use   Vaping Use: Never used  Substance and Sexual Activity   Alcohol use: No   Drug use: No   Sexual activity: Not on file  Other Topics Concern   Not on file  Social History Narrative   Not on file   Social Determinants of Health   Financial Resource Strain: Not on file  Food Insecurity: Not on file  Transportation Needs: Not on file  Physical Activity: Not on file  Stress: Not on file  Social Connections: Not on file     Family History: The patient's family history includes Heart failure in her father and mother; Hypertension in her father and mother.  ROS:   Please see the history of present illness.     EKGs/Labs/Other Studies Reviewed:    The following studies were reviewed today:  Echocardiogram 10/28/2019: Impressions: 1. Left ventricular ejection fraction, by estimation, is 70 to 75%. The  left ventricle has hyperdynamic function. The left ventricle has no  regional wall motion abnormalities. There is mild concentric left  ventricular hypertrophy. Left ventricular  diastolic parameters are consistent with Grade I diastolic dysfunction  (impaired relaxation).   2. Right ventricular systolic function is normal. The right ventricular  size is normal.   3. The mitral valve is normal in structure. No evidence of mitral valve  regurgitation. No evidence of mitral stenosis.   4. The aortic valve is normal in structure. Aortic valve regurgitation is  not visualized. No aortic stenosis is present.   5. The inferior vena cava is normal in size with greater than 50%  respiratory variability, suggesting right atrial pressure of 3 mmHg.  EKG:  EKG ordered today. EKG personally reviewed and demonstrates normal sinus  rhythm, rate 76 bpm, with sinus arrhythmias and non-specific T wave changes. Left axis deviation. Normal PR and QRS intervals. QTc 447 ms.  Recent Labs: 06/01/2020: BUN 7; Creatinine, Ser 0.72; Potassium 4.1; Sodium 140 09/02/2020: ALT 14  Recent Lipid Panel    Component Value Date/Time   CHOL 175 05/23/2021 0821   TRIG 84 05/23/2021 0821   HDL 63 05/23/2021 0821   CHOLHDL 2.8 05/23/2021 0821   CHOLHDL 5.1 10/29/2019 0307   VLDL 32 10/29/2019 0307   LDLCALC 97 05/23/2021 0821    Physical Exam:    Vital Signs: BP 132/78   Pulse 80   Ht 5\' 1"  (1.549 m)   Wt 129 lb 12.5 oz (58.9 kg)   SpO2 100%   BMI 24.52 kg/m     Wt Readings from Last 3 Encounters:  05/29/21 129 lb 12.5 oz (58.9 kg)  03/06/21 132 lb 6.4 oz (60.1 kg)  12/02/20 125 lb 9.6 oz (57 kg)     General: 75 y.o. African-American female in no acute distress. HEENT: Normocephalic and atraumatic. Sclera clear.  Neck: Supple. No carotid bruits. No JVD. Heart: RRR. Distinct S1 and S2. No murmurs, gallops, or rubs. Radial pulses 2+ and equal bilaterally. Lungs: No increased work of breathing. Clear to ausculation bilaterally. No wheezes, rhonchi, or rales.  Abdomen: Soft, non-distended, and non-tender to palpation.  Extremities: No lower extremity edema.    Skin: Warm and dry. Neuro: Alert and oriented x3. No focal deficits. Psych: Normal affect. Responds appropriately.  Assessment:    1. Coronary artery calcification   2. Primary hypertension   3. Hyperlipidemia, unspecified hyperlipidemia type   4. Atypical chest pain     Plan:    Coronary Calcifications Coronary calcifications noted in LAD on prior abdominal/pelvic CT in 10/2019. Echo around this time showed normal LV function. - Patient describes atypical chest pain at rest. No exertional chest pain. - Continue Aspirin 81mg  daily and Lipitor 40mg  daily/Zetia 10mg  daily. - Symptoms do not sound like cardiac chest pain given she is able to walk/ride stationary  bike without any problems. She does have multiple CV risk factors as well as a family history of CAD so did discuss additional evaluation with Myoview or coronary CTA. She would like to wait to see if she has any more episodes of this which I think is very reasonable.   Hypertension BP borderline elevated in the office at 132/78. However, has been running high at home. - Start Amlodipine 5mg  daily. Patient is concerned about having lower extremity edema with this but is willing to try. - Advised patient to let us know if BP consistently >130/80 on the Amlodipine.   Hyperlipidemia Lipid panel on 05/23/2021: Total Cholesterol 175, Triglycerides 84, HDL 63, LDL 97.  - Currently on Lipitor 40mg  daily and Zetia 10mg  daily.  - Per Dr. Margaretann Loveless, "Lipids have improved on Lipitor and Zetia and diet modification. Let's stay on current doses and check again in 6 months."   Disposition: Follow up with me in 3 months.  ADDENDUM 05/30/2021 1:31PM: Dr. Margaretann Loveless reviewed note and agrees. She recommends coronary CTA over Myoview if patient is still having chest pain at follow-up visit with me.  Medication Adjustments/Labs and Tests Ordered: Current medicines are reviewed at length with the patient today.  Concerns regarding medicines are outlined above.  Orders Placed This Encounter  Procedures   EKG 12-Lead    Meds ordered this encounter  Medications   DISCONTD: amLODipine (NORVASC) 5 MG tablet    Sig: Take 1 tablet (5 mg total) by mouth daily.    Dispense:  90 tablet    Refill:  3   amLODipine (NORVASC) 5 MG tablet    Sig: Take 1 tablet (5 mg total) by mouth daily.    Dispense:  30 tablet    Refill:  3     Patient Instructions  Medication Instructions:  START Amlodipine 5 mg daily  *If you need a refill on your cardiac medications before your next appointment, please call your pharmacy*  Lab Work: NONE ordered at this time of appointment   If you have labs (blood work) drawn today and  your tests are completely normal, you will receive your results only by: [place] (if you have MyChart) OR A paper copy in the mail If you have any lab test that is abnormal or we need to change your treatment, we will call you to  review the results.  Testing/Procedures: NONE ordered at this time of appointment   Follow-Up: At [facility], you and your health needs are our priority.  As part of our continuing mission to provide you with exceptional heart care, we have created designated Provider Care Teams.  These Care Teams include your primary Cardiologist (physician) and Advanced Practice Providers (APPs -  Physician Assistants and Nurse Practitioners) who all work together to provide you with the care you need, when you need it.  Your next appointment:   3 month(s)  The format for your next appointment:   In Person  Provider:   Sande Rives, PA-C       Other Instructions    Signed, Darreld Mclean, PA-C  05/29/2021 9:38 AM    [place]

## 2021-05-19 NOTE — Progress Notes (Signed)
   Covid-19 Vaccination Clinic  Name:  Kelly Harrington    MRN: [id number] DOB: 1946/05/21  05/19/2021  [name] was observed post Covid-19 immunization for 15 minutes without incident. She was provided with Vaccine Information Sheet and instruction to access the V-Safe system.   [name] was instructed to call 911 with any severe reactions post vaccine: Difficulty breathing  Swelling of face and throat  A fast heartbeat  A bad rash all over body  Dizziness and weakness   Immunizations Administered     Name Date Dose VIS Date Route   Pfizer Covid-19 Vaccine Bivalent Booster 05/19/2021  9:41 AM 0.3 mL 02/15/2021 Intramuscular   Manufacturer: [place]   Lot: [id number]   [place]: [phone]

## 2021-05-23 ENCOUNTER — Other Ambulatory Visit: Payer: Self-pay

## 2021-05-23 DIAGNOSIS — E785 Hyperlipidemia, unspecified: Secondary | ICD-10-CM

## 2021-05-23 LAB — LIPID PANEL
Chol/HDL Ratio: 2.8 ratio (ref 0.0–4.4)
Cholesterol, Total: 175 mg/dL (ref 100–199)
HDL: 63 mg/dL (ref >39)
LDL Chol Calc (NIH): 97 mg/dL (ref 0–99)
Triglycerides: 84 mg/dL (ref 0–149)
VLDL Cholesterol Cal: 15 mg/dL (ref 5–40)

## 2021-05-25 NOTE — Progress Notes (Signed)
Per Natasha Bence, NP request, faxed recent lipid panel test results to pt's PCP Dr. Donavan Burnet at [phone].  Fax transmitted "OK".

## 2021-05-26 ENCOUNTER — Other Ambulatory Visit: Payer: Self-pay

## 2021-05-26 DIAGNOSIS — E785 Hyperlipidemia, unspecified: Secondary | ICD-10-CM

## 2021-05-27 ENCOUNTER — Encounter: Payer: Self-pay | Admitting: Student

## 2021-05-27 DIAGNOSIS — I10 Essential (primary) hypertension: Secondary | ICD-10-CM | POA: Insufficient documentation

## 2021-05-27 DIAGNOSIS — I2510 Atherosclerotic heart disease of native coronary artery without angina pectoris: Secondary | ICD-10-CM | POA: Insufficient documentation

## 2021-05-27 DIAGNOSIS — I2584 Coronary atherosclerosis due to calcified coronary lesion: Secondary | ICD-10-CM | POA: Insufficient documentation

## 2021-05-29 ENCOUNTER — Other Ambulatory Visit: Payer: Self-pay

## 2021-05-29 ENCOUNTER — Encounter: Payer: Self-pay | Admitting: Student

## 2021-05-29 ENCOUNTER — Ambulatory Visit: Payer: Medicare Other | Admitting: Student

## 2021-05-29 VITALS — BP 132/78 | HR 80 | Ht 61.0 in | Wt 129.8 lb

## 2021-05-29 DIAGNOSIS — I10 Essential (primary) hypertension: Secondary | ICD-10-CM | POA: Diagnosis not present

## 2021-05-29 DIAGNOSIS — I2510 Atherosclerotic heart disease of native coronary artery without angina pectoris: Secondary | ICD-10-CM

## 2021-05-29 DIAGNOSIS — R0789 Other chest pain: Secondary | ICD-10-CM

## 2021-05-29 DIAGNOSIS — I2584 Coronary atherosclerosis due to calcified coronary lesion: Secondary | ICD-10-CM | POA: Diagnosis not present

## 2021-05-29 DIAGNOSIS — E785 Hyperlipidemia, unspecified: Secondary | ICD-10-CM | POA: Diagnosis not present

## 2021-05-29 MED ORDER — AMLODIPINE BESYLATE 5 MG PO TABS: 5.0000 mg | ORAL_TABLET | Freq: Every day | ORAL | 3 refills | Status: DC

## 2021-05-29 NOTE — Patient Instructions (Addendum)
Medication Instructions:  START Amlodipine 5 mg daily  *If you need a refill on your cardiac medications before your next appointment, please call your pharmacy*  Lab Work: NONE ordered at this time of appointment   If you have labs (blood work) drawn today and your tests are completely normal, you will receive your results only by: [place] (if you have MyChart) OR A paper copy in the mail If you have any lab test that is abnormal or we need to change your treatment, we will call you to review the results.  Testing/Procedures: NONE ordered at this time of appointment   Follow-Up: At [facility], you and your health needs are our priority.  As part of our continuing mission to provide you with exceptional heart care, we have created designated Provider Care Teams.  These Care Teams include your primary Cardiologist (physician) and Advanced Practice Providers (APPs -  Physician Assistants and Nurse Practitioners) who all work together to provide you with the care you need, when you need it.  Your next appointment:   3 month(s)  The format for your next appointment:   In Person  Provider:   Sande Rives, PA-C       Other Instructions

## 2021-08-02 IMAGING — DX DG ABD PORTABLE 1V
1 series · 1 of 1 positions shown · non-contrast
Comparison: October 10, 2019

CLINICAL DATA: 8 hour delay for small-bowel obstruction.

EXAM:
PORTABLE ABDOMEN - 1 VIEW

[abdomen kub]
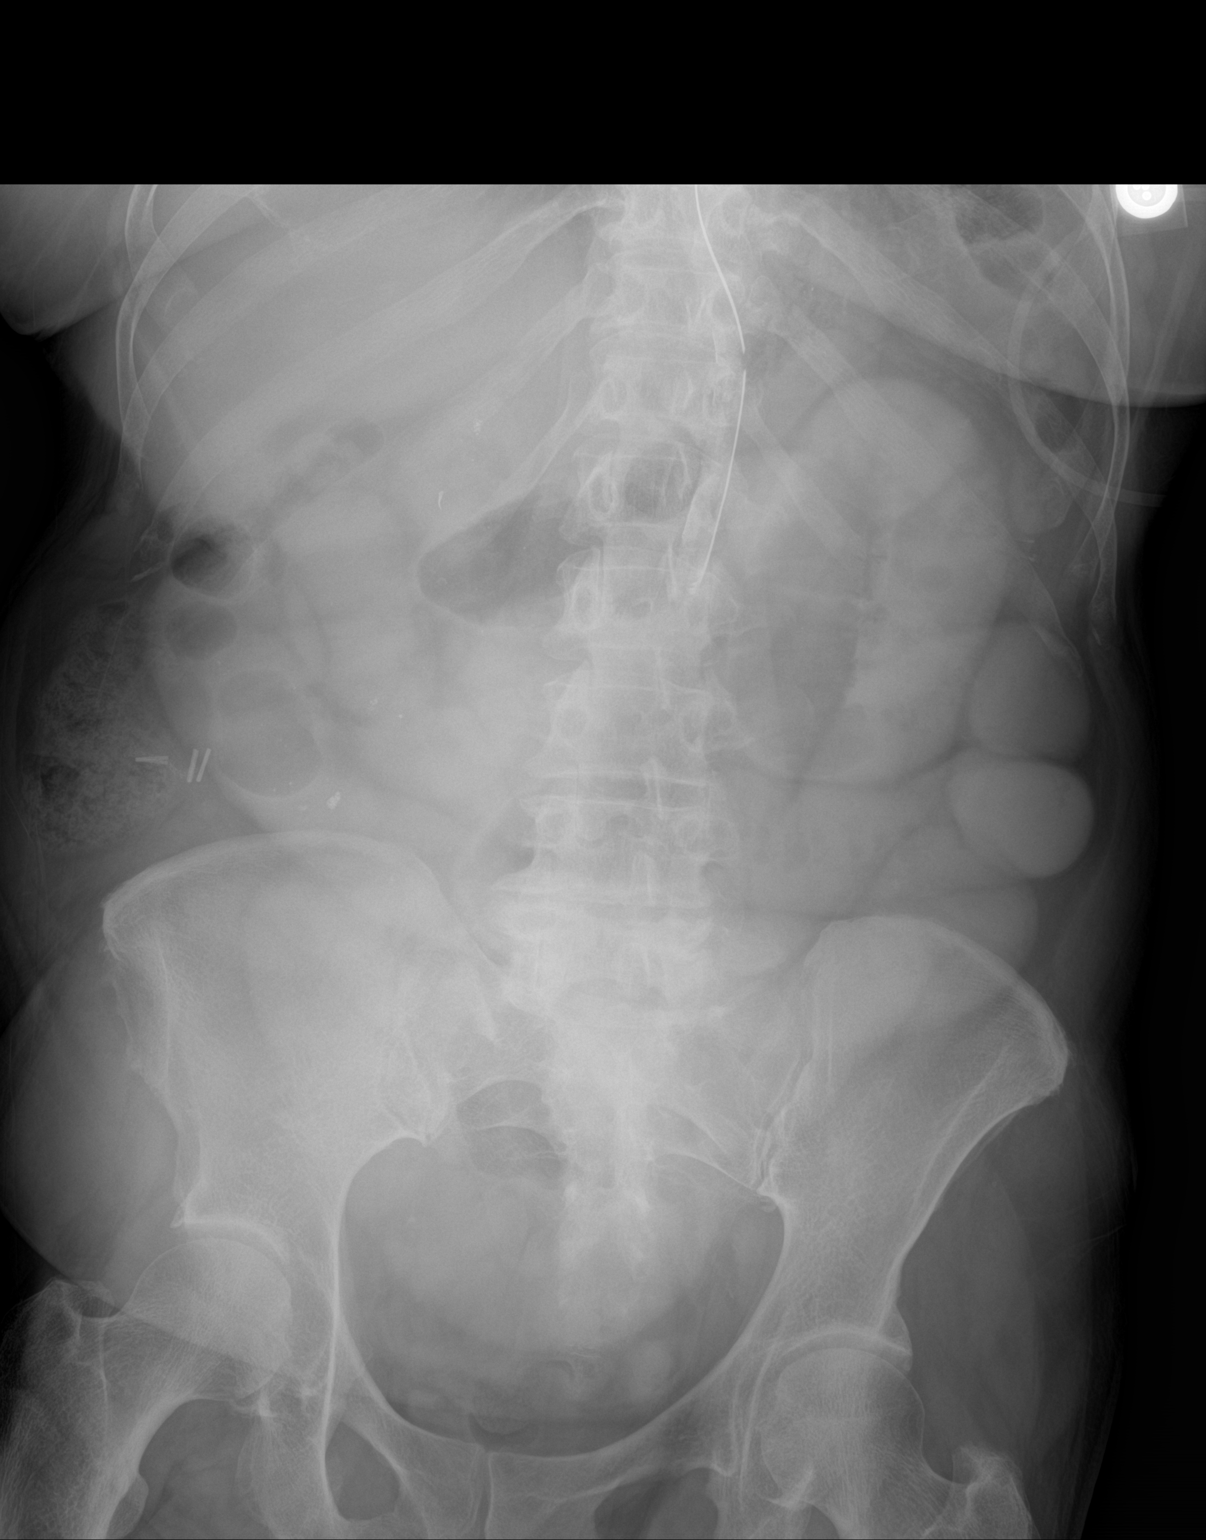

[1 of 1 positions shown; findings below may reference images not displayed]

FINDINGS: A nasogastric tube is seen with its distal tip overlying the body of
the stomach. Multiple dilated, mildly opacified small bowel loops
are seen throughout the abdomen and pelvis. No contrast is seen
within the large bowel. Radiopaque surgical clips are seen within
the mid to lower right abdomen.
IMPRESSION: Findings consistent with small-bowel obstruction.

## 2021-08-03 IMAGING — DX DG ABDOMEN 2V
2 series · 2 of 2 positions shown · non-contrast
Comparison: 10/10/2019

CLINICAL DATA: Evaluate contrast transit, suspected bowel
obstruction

EXAM:
ABDOMEN - 2 VIEW

[abdomen erect]
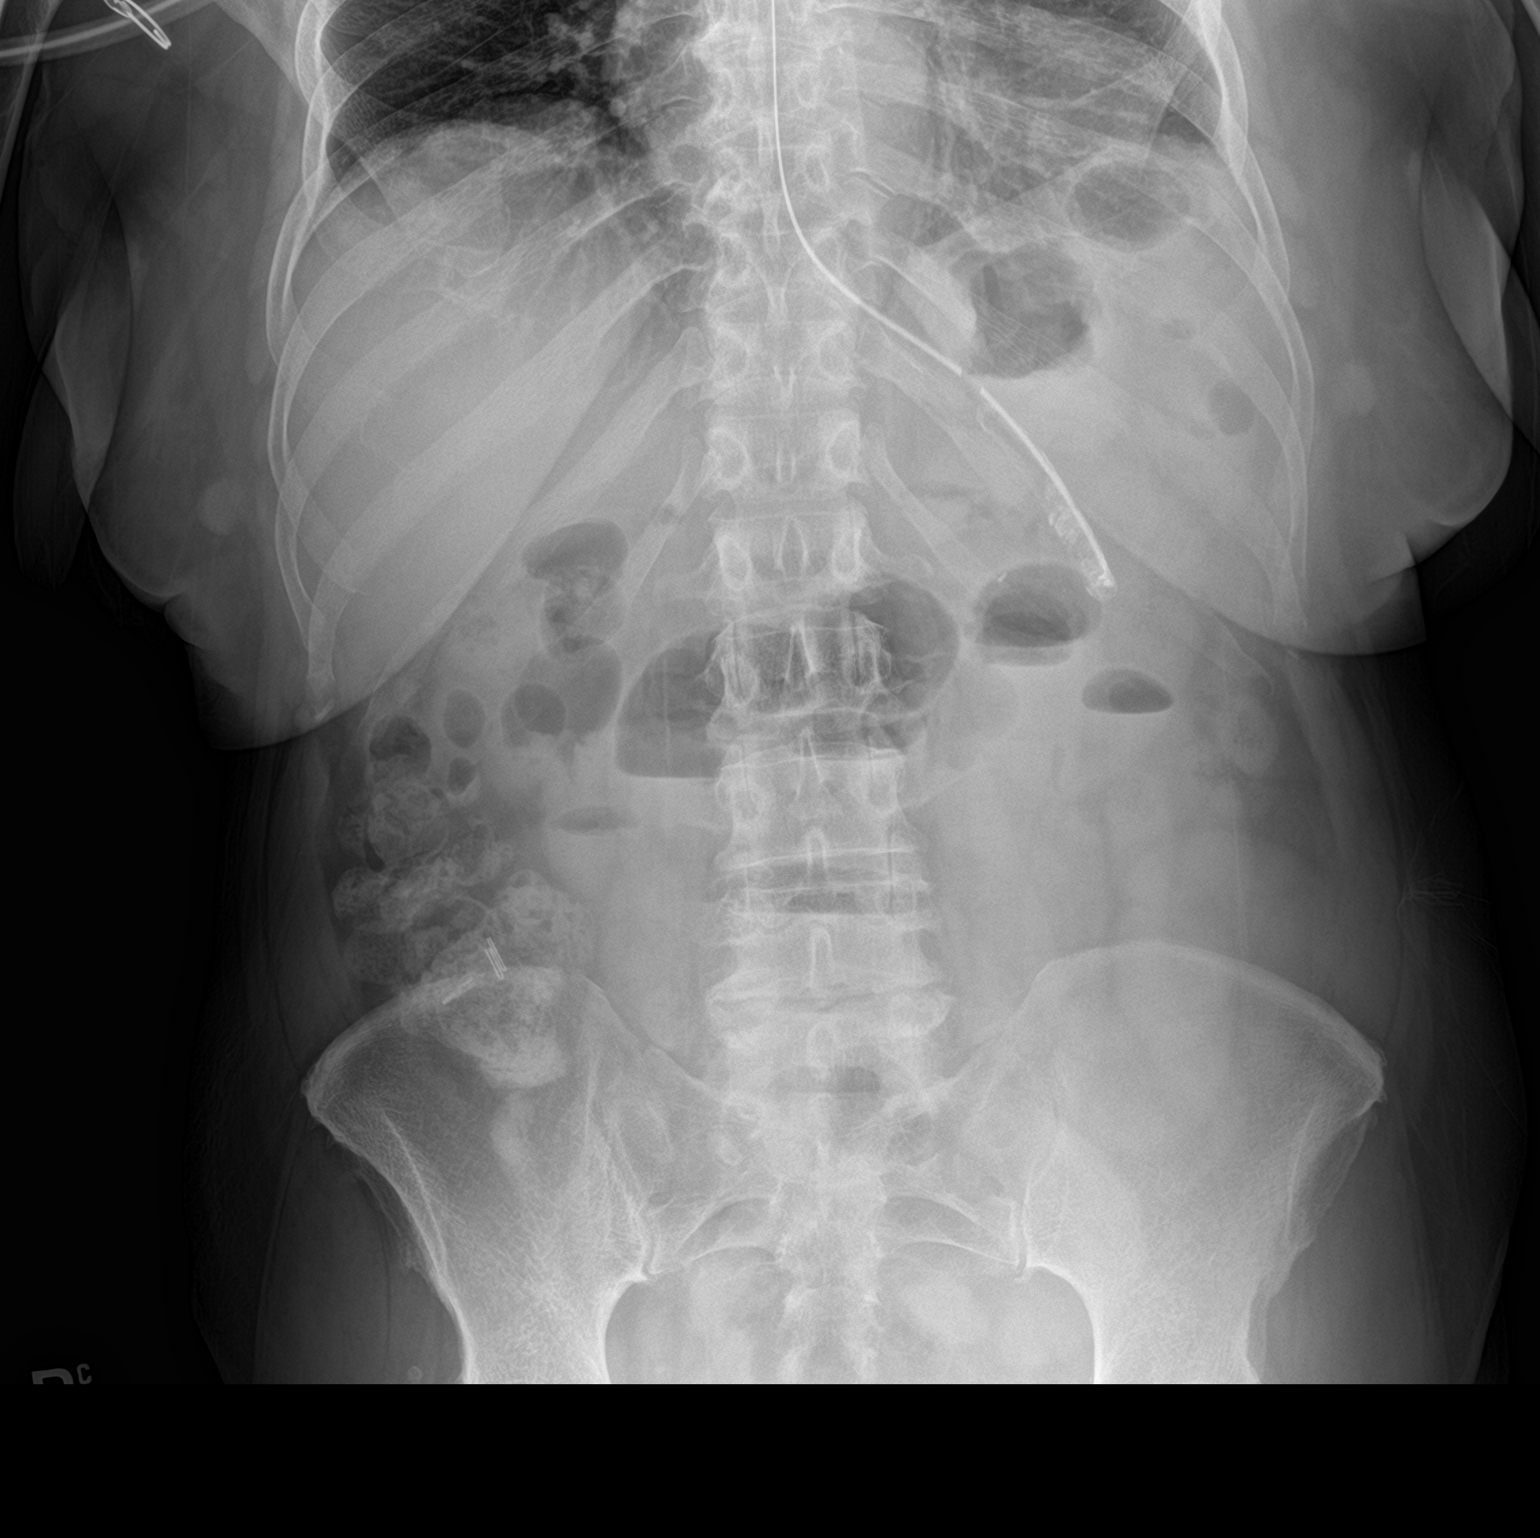

[abdomen supine]
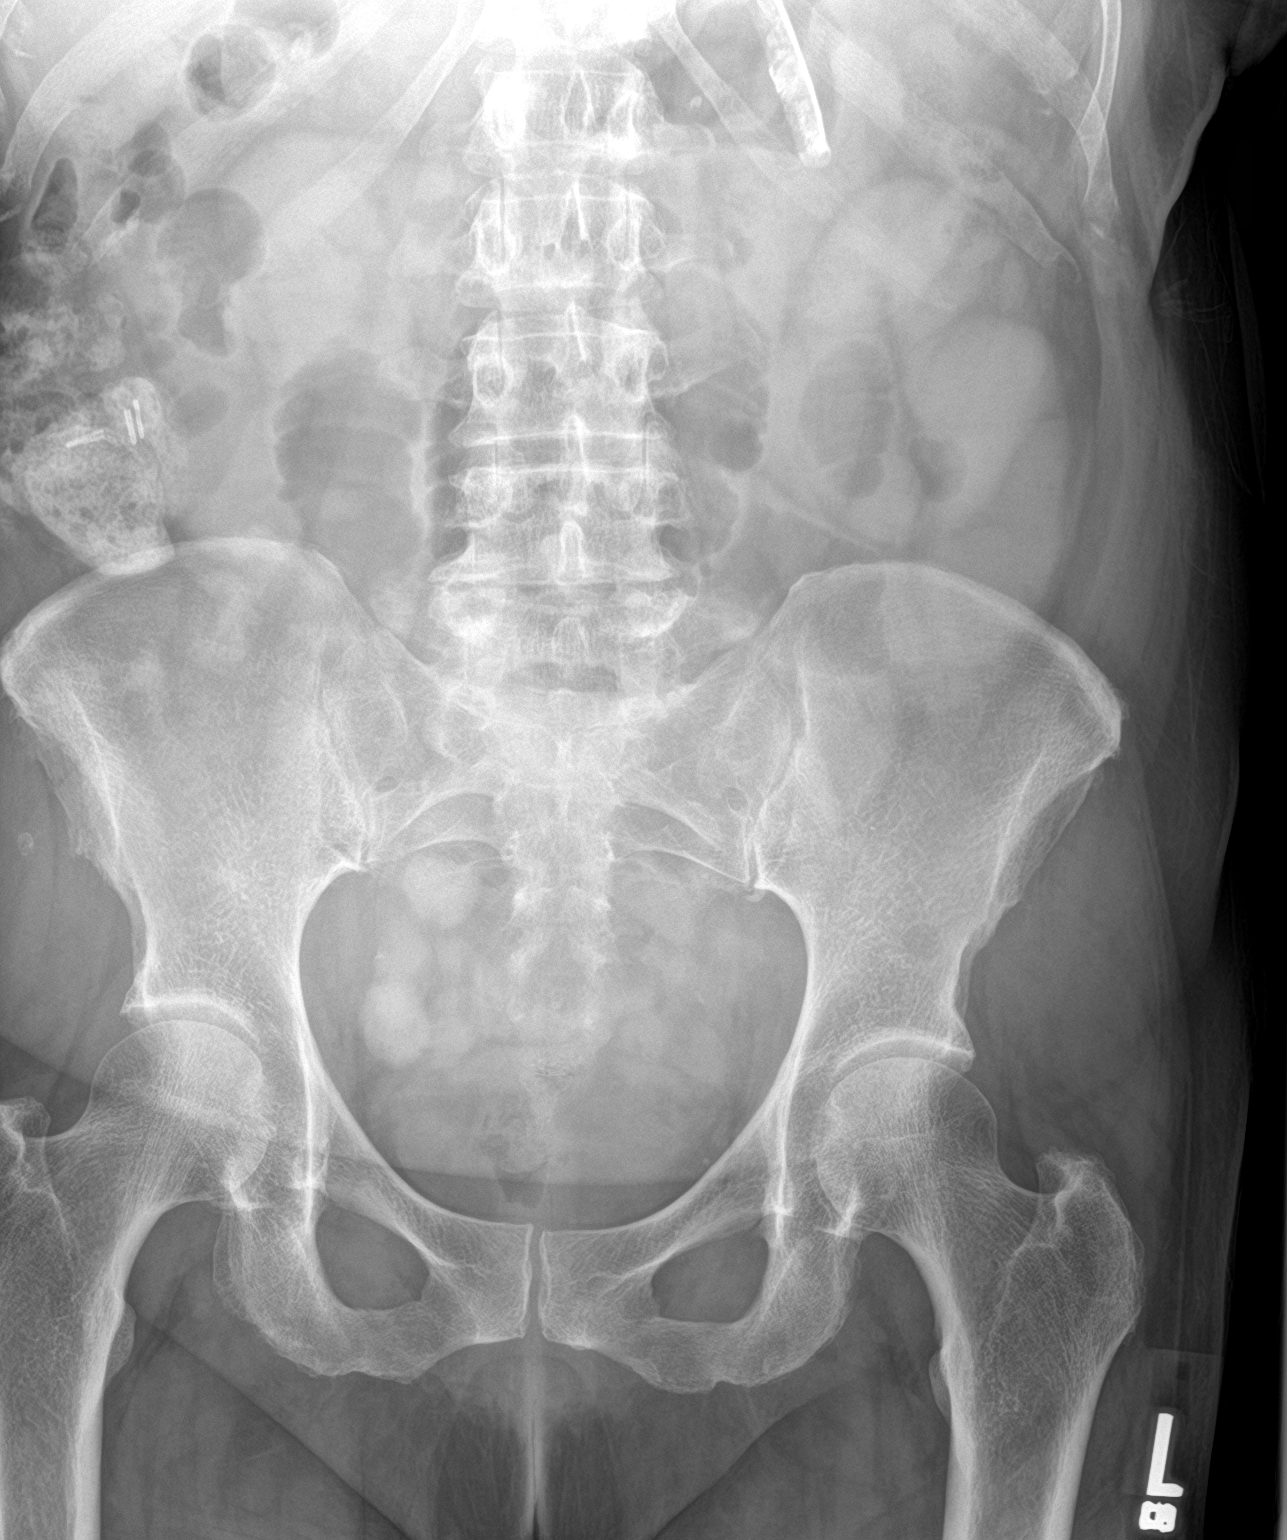

[2 of 2 positions shown; findings below may reference images not displayed]

FINDINGS: Interval transit of administered enteric contrast to the proximal
transverse colon with scattered gas present to the transverse colon
as well. The small bowel remains diffusely fluid-filled although not
overtly distended. Esophagogastric tube with tip and side port below
the diaphragm. No free air.
IMPRESSION: 1. Interval transit of administered enteric contrast to the proximal
transverse colon with scattered gas present to the transverse colon
as well. The small bowel remains diffusely fluid-filled although not
overtly distended.

2.  Esophagogastric tube with tip and side port below the diaphragm.

## 2021-08-04 IMAGING — DX DG ABD PORTABLE 1V
1 series · 1 of 1 positions shown · non-contrast
Comparison: 10/12/2019

CLINICAL DATA: Nasogastric placement

EXAM:
PORTABLE ABDOMEN - 1 VIEW

[abdomen kub]
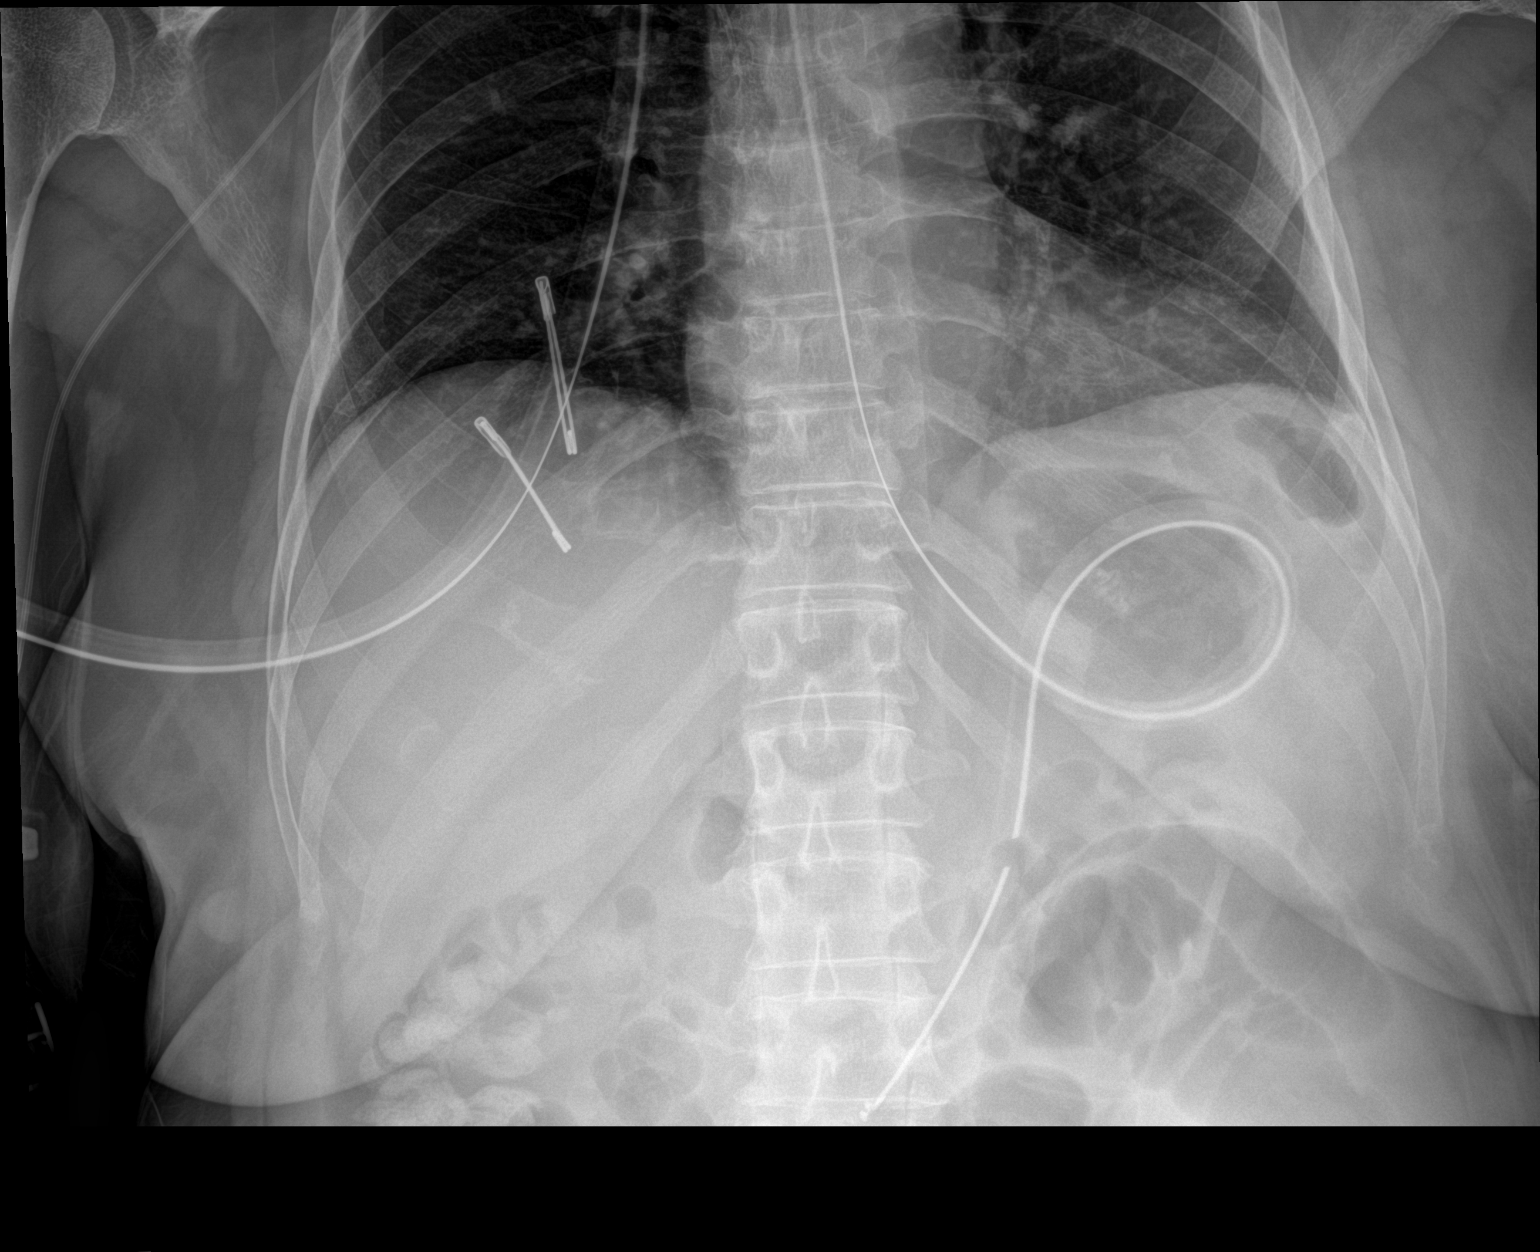

[1 of 1 positions shown; findings below may reference images not displayed]

FINDINGS: Orogastric or nasogastric tube enters the stomach, loops in the
fundus and has its tip at the body antral junction.
IMPRESSION: Orogastric or nasogastric tube tip at the body antral junction of
the stomach.

## 2021-08-05 IMAGING — DX DG ABD PORTABLE 1V
1 series · 1 of 1 positions shown · non-contrast
Comparison: Yesterday

CLINICAL DATA: Nasogastric tube placement

EXAM:
PORTABLE ABDOMEN - 1 VIEW

[abdomen kub]
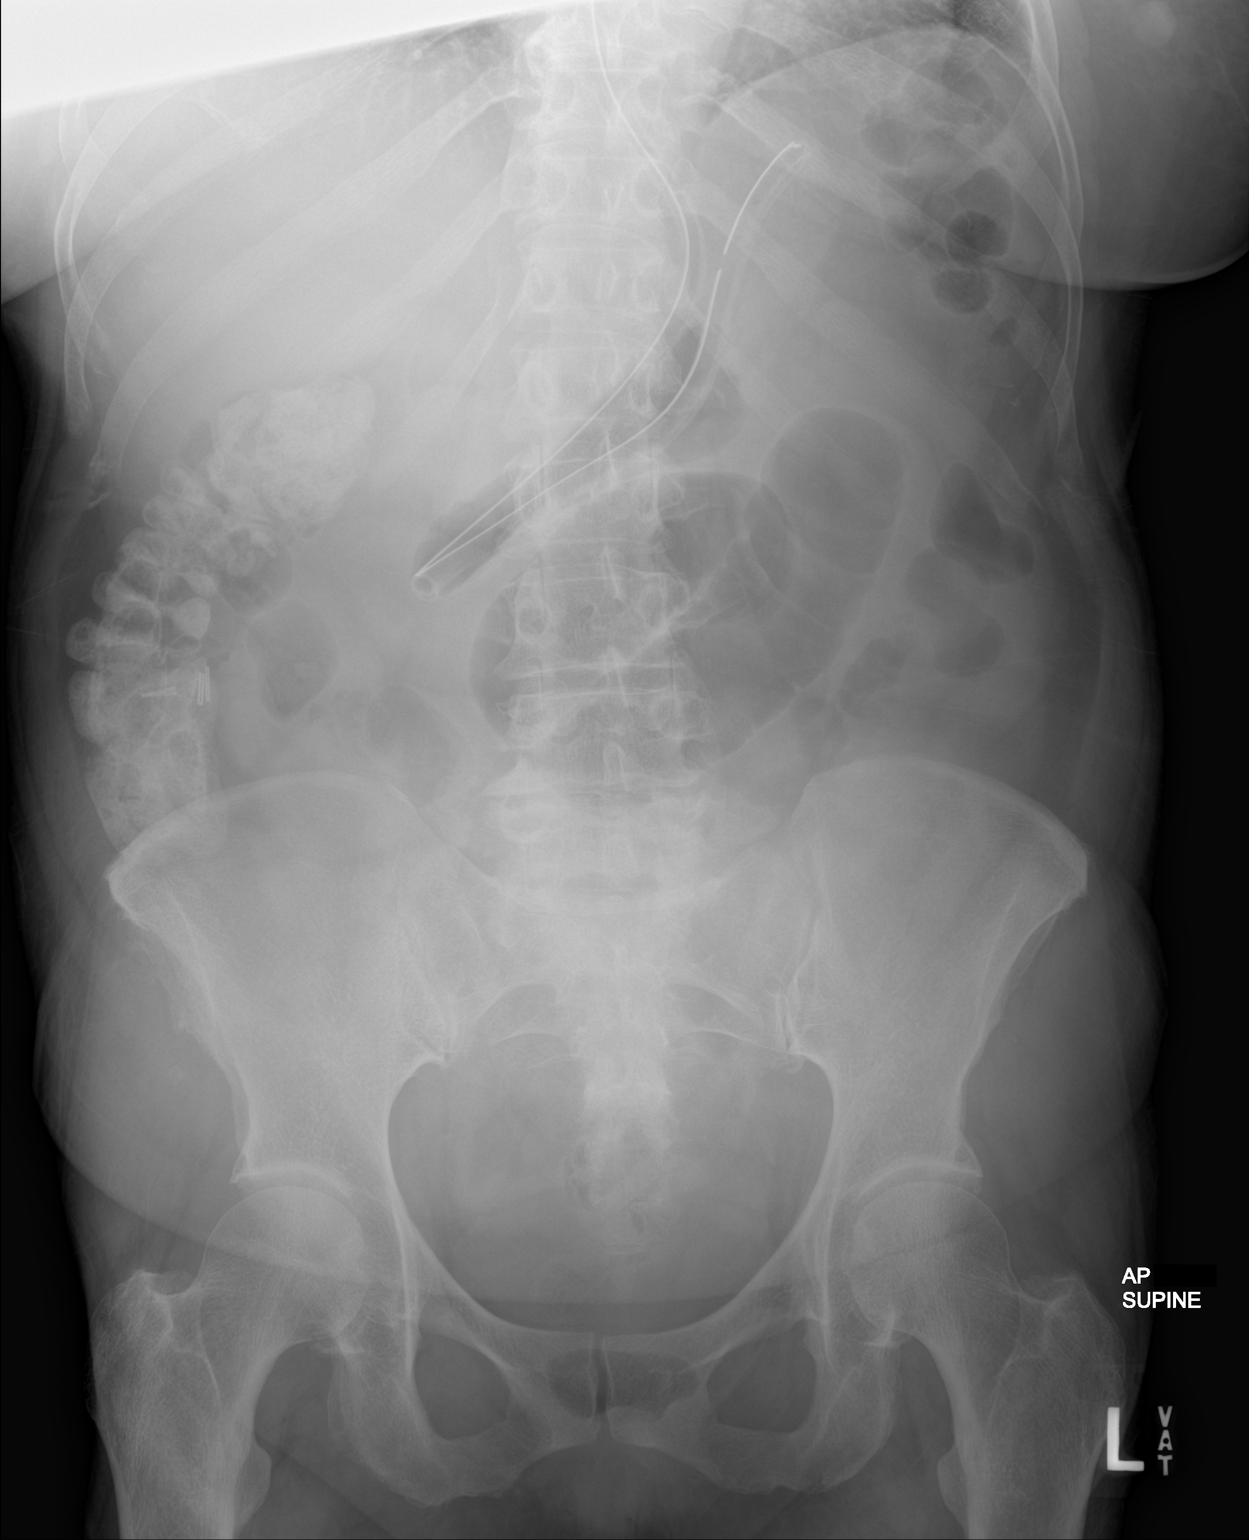

[1 of 1 positions shown; findings below may reference images not displayed]

FINDINGS: The nasogastric tube loops through the stomach with tip at the
fundus. Dilated small bowel in the central abdomen measuring up to 4
cm in diameter. Oral contrast is again seen in the ascending colon.
IMPRESSION: 1. Lengthened orogastric tube which loops through the stomach with
tip at the gastric fundus.
2. Continued small bowel dilatation.

## 2021-08-10 ENCOUNTER — Telehealth: Payer: Self-pay | Admitting: Internal Medicine

## 2021-08-10 ENCOUNTER — Other Ambulatory Visit: Payer: Self-pay

## 2021-08-10 MED ORDER — AMLODIPINE BESYLATE 5 MG PO TABS: 5.0000 mg | ORAL_TABLET | Freq: Every day | ORAL | 3 refills | Status: DC

## 2021-08-10 NOTE — Telephone Encounter (Signed)
°*  STAT* If patient is at the pharmacy, call can be transferred to refill team.   1. Which medications need to be refilled? (please list name of each medication and dose if known) amLODipine (NORVASC) 5 MG tablet Take 1 tablet (5 mg total) by mouth daily.  2. Which pharmacy/location (including street and city if local pharmacy) is medication to be sent to?[facility] ([place], [place]  Phone:  [phone] Fax:  [phone]   3. Do they need a 30 day or 90 day supply? 90 ds

## 2021-08-10 NOTE — Telephone Encounter (Signed)
Called patient to advise prescription refill sent to pharmacy.

## 2021-08-14 NOTE — Progress Notes (Addendum)
Cardiology Office Note:    Date:  08/28/2021   ID:  Kelly Harrington, DOB 1945/08/01, MRN [id number]  PCP:  Alroy Dust, Carlean Jews.Marlou Sa, MD  Cardiologist:  Elouise Munroe, MD  Electrophysiologist:  None   Referring MD: Aurea Graff.Marlou Sa, MD   Chief Complaint: follow-up of atypical chest pain and hypertension  History of Present Illness:    Kelly Harrington is a 76 y.o. 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 Margaretann Loveless and presents today for follow-up of atypical chest pain and hypertension.   Patient was first seen by Cardiology during an admission in 10/2019 for small bowel obstruction and AKI. We were consulted for an abnormal EKG. Computer interpretation suggested accelerated junctional rhythm but on official read by Cardiology fel to be sinus rhythm. However, she did have some evidence of LVH and T wave inversions in lateral leads. Coronary calcifications could be seen in the LAD on abdominal/pelvic CT. Outpatient Echo was ordered and showed LVEF of 70-75% with mild LVH and grade 1 diastolic dysfunction but no significant valvular disease. We have mainly been following her for hypertension and hyperlipidemia since then.   Patient was last seen by me in 05/2021 at which time she reported a couple of episodes of very mild chest discomfort at rest but no pain when walking or riding a stationary bike for 1 hour at a time. Symptoms were felt to be atypical but given multiple risk factors coronary CTA or Myoview was offered but patient wanted to wait to see if she had any additional episodes of pain. Amlodipine was added for further management of BP.   Patient presents today for follow-up.  Here alone.  Patient reports doing well since last visit.  She does continue to have atypical chest discomfort at rest.  She  emphasizes that it is discomfort and not pain. She has a difficult time describing the discomfort but states it does not last for very long.  She denies any chest pain with activity but does state she is not being as active as before.  She also notes some left shoulder and arm pain which will last for a few minutes.  She attributes this to rotator cuff issues.  She also states that she has a high pain tolerance so she does not think much of her chest discomfort.  She denies any shortness of breath, orthopnea, PND, lower extremity edema, palpitations, lightheadedness, dizziness, syncope.  Past Medical History:  Diagnosis Date   Coronary artery calcification    noted on CT scan in 10/2019   Hyperlipidemia    Hypertension    SBO (small bowel obstruction) (HCC)     Past Surgical History:  Procedure Laterality Date   APPENDECTOMY     LAPAROSCOPIC APPENDECTOMY N/A 10/06/2019   Procedure: APPENDECTOMY LAPAROSCOPIC;  Surgeon: Autumn Messing III, MD;  Location: [facility];  Service: General;  Laterality: N/A;   ROTATOR CUFF REPAIR     TRIGGER FINGER RELEASE      Current Medications: Current Meds  Medication Sig   amLODipine (NORVASC) 5 MG tablet Take 1 tablet (5 mg total) by mouth daily.   aspirin 81 MG chewable tablet 1 tablet   atorvastatin (LIPITOR) 40 MG tablet Take 1 tablet (40 mg total) by mouth daily.   cetirizine (ZYRTEC) 10 MG tablet Take 10 mg by mouth daily as needed for allergies.  diclofenac (VOLTAREN) 75 MG EC tablet Take 75 mg by mouth as needed.   ezetimibe (ZETIA) 10 MG tablet Take 1 tablet (10 mg total) by mouth daily.   metoprolol tartrate (LOPRESSOR) 100 MG tablet Take 1 tablet (100 mg total) by mouth 2 (two) times daily. Take 90 minutes to 2 hours before scan.   polyethylene glycol (MIRALAX / GLYCOLAX) 17 g packet Take 17 g by mouth daily.     Allergies:   Codeine   Social History   Socioeconomic History   Marital status: Married    Spouse name: Not on file   Number of  children: Not on file   Years of education: Not on file   Highest education level: Not on file  Occupational History   Not on file  Tobacco Use   Smoking status: Never   Smokeless tobacco: Never  Vaping Use   Vaping Use: Never used  Substance and Sexual Activity   Alcohol use: No   Drug use: No   Sexual activity: Not on file  Other Topics Concern   Not on file  Social History Narrative   Not on file   Social Determinants of Health   Financial Resource Strain: Not on file  Food Insecurity: Not on file  Transportation Needs: Not on file  Physical Activity: Not on file  Stress: Not on file  Social Connections: Not on file     Family History: The patient's family history includes Heart failure in her father and mother; Hypertension in her father and mother.  ROS:   Please see the history of present illness.     EKGs/Labs/Other Studies Reviewed:    The following studies were reviewed today:  Echocardiogram 10/28/2019: Impressions: 1. Left ventricular ejection fraction, by estimation, is 70 to 75%. The  left ventricle has hyperdynamic function. The left ventricle has no  regional wall motion abnormalities. There is mild concentric left  ventricular hypertrophy. Left ventricular  diastolic parameters are consistent with Grade I diastolic dysfunction  (impaired relaxation).   2. Right ventricular systolic function is normal. The right ventricular  size is normal.   3. The mitral valve is normal in structure. No evidence of mitral valve  regurgitation. No evidence of mitral stenosis.   4. The aortic valve is normal in structure. Aortic valve regurgitation is  not visualized. No aortic stenosis is present.   5. The inferior vena cava is normal in size with greater than 50%  respiratory variability, suggesting right atrial pressure of 3 mmHg.  EKG:  EKG not ordered today.  Recent Labs: 09/02/2020: ALT 14  Recent Lipid Panel    Component Value Date/Time   CHOL 175  05/23/2021 0821   TRIG 84 05/23/2021 0821   HDL 63 05/23/2021 0821   CHOLHDL 2.8 05/23/2021 0821   CHOLHDL 5.1 10/29/2019 0307   VLDL 32 10/29/2019 0307   LDLCALC 97 05/23/2021 0821    Physical Exam:    Vital Signs: BP 126/78    Pulse 88    Ht 5\' 1"  (1.549 m)    Wt 129 lb 6.4 oz (58.7 kg)    SpO2 99%    BMI 24.45 kg/m     Wt Readings from Last 3 Encounters:  08/28/21 129 lb 6.4 oz (58.7 kg)  05/29/21 129 lb 12.5 oz (58.9 kg)  03/06/21 132 lb 6.4 oz (60.1 kg)     General: 76 y.o. female in no acute distress. HEENT: Normocephalic and atraumatic. Sclera clear.  Neck: Supple. No carotid  bruits. No JVD. Heart: RRR. Distinct S1 and S2. No murmurs, gallops, or rubs. Radial pulses 2+ and equal bilaterally. Lungs: No increased work of breathing. Clear to ausculation bilaterally. No wheezes, rhonchi, or rales.  Abdomen: Soft, non-distended, and non-tender to palpation.  Extremities: No lower extremity edema.    Skin: Warm and dry. Neuro: Alert and oriented x3. No focal deficits. Psych: Normal affect. Responds appropriately.  Assessment:    1. Atypical chest pain   2. Coronary artery calcification   3. Primary hypertension   4. Hyperlipidemia, unspecified hyperlipidemia type     Plan:    Atypical Chest Pain Coronary Calcifications Coronary calcifications noted in LAD on prior abdominal/pelvic CT in 10/2019. Echo around this time showed normal LV function. Patient reported atypical chest pain at rest at last visit. Ischemic evaluation with coronary CTA/ Myoview was offered given multiple risk factors but patient declined and wanted to wait to see if she had more episodes of pain.  - She continues to have atypical chest discomfort at rest. She also reports left arm pain which she has always attributed to rotator cuff issues. - Continue Aspirin 81mg  daily and Lipitor 40mg  daily/Zetia 10mg  daily. - Given persistent chest discomfort, recommended going ahead and proceeding with coronary  CTA. Patient was agreeable to this. Will check CMET today to ensure renal function is stable. Will prescribe Lopressor 100mg  for patient to take 90-120 minutes prior to CTA.   Hypertension BP well controlled. - Continue Amlodipine 5mg  daily.    Hyperlipidemia Lipid panel on 05/23/2021: Total Cholesterol 175, Triglycerides 84, HDL 63, LDL 97.  - Currently on Lipitor 40mg  daily and Zetia 10mg  daily.  - Per Dr. Margaretann Loveless last note in 02/2021, "Lipids have improved on Lipitor and Zetia and diet modification. Let's stay on current doses and check again in 6 months." Will order repeat lipid panel and CMET today.  - Of note, patient states she has previously had myalgias on statins and she has tried multiple statins in the past including Crestor. She is currently tolerating current regimen well with no myalgias. Will plan to wait for coronary CTA results to help guide how aggressive we need to be in management of hyperlipidemia.   Disposition: Follow up in 6 months.   Medication Adjustments/Labs and Tests Ordered: Current medicines are reviewed at length with the patient today.  Concerns regarding medicines are outlined above.  Orders Placed This Encounter  Procedures   CT CORONARY MORPH W/CTA COR W/SCORE W/CA W/CM &/OR WO/CM   Comprehensive metabolic panel   Lipid panel   Meds ordered this encounter  Medications   metoprolol tartrate (LOPRESSOR) 100 MG tablet    Sig: Take 1 tablet (100 mg total) by mouth 2 (two) times daily. Take 90 minutes to 2 hours before scan.    Dispense:  1 tablet    Refill:  0    Patient Instructions  Medication Instructions:  No Changes *If you need a refill on your cardiac medications before your next appointment, please call your pharmacy*   Lab Work: CMET, Lipid Panel : Today If you have labs (blood work) drawn today and your tests are completely normal, you will receive your results only by: [place] (if you have MyChart) OR A paper copy in the  mail If you have any lab test that is abnormal or we need to change your treatment, we will call you to review the results.   Testing/Procedures: [facility], [address] Your physician has requested  that you have cardiac CT. Cardiac computed tomography (CT) is a painless test that uses an x-ray machine to take clear, detailed pictures of your heart. For further information please visit [email or web address]. Please follow instruction sheet as given.     Follow-Up: At [facility], you and your health needs are our priority.  As part of our continuing mission to provide you with exceptional heart care, we have created designated Provider Care Teams.  These Care Teams include your primary Cardiologist (physician) and Advanced Practice Providers (APPs -  Physician Assistants and Nurse Practitioners) who all work together to provide you with the care you need, when you need it.  We recommend signing up for the patient portal called "MyChart".  Sign up information is provided on this After Visit Summary.  MyChart is used to connect with patients for Virtual Visits (Telemedicine).  Patients are able to view lab/test results, encounter notes, upcoming appointments, etc.  Non-urgent messages can be sent to your provider as well.   To learn more about what you can do with MyChart, go to [email or web address].    Your next appointment:   6 month(s)  The format for your next appointment:   In Person  Provider:   Elouise Munroe, MD       Your cardiac CT will be scheduled at one of the below locations:   [facility] [address] [place], [place] [address] [phone] If scheduled at [facility], please arrive at the [facility] and Children's Entrance (Entrance C2) of [facility] 30 minutes prior to test start time. You can use the FREE valet parking offered at entrance C (encouraged to control the heart rate for the test)  Proceed to the  [facility] Radiology Department (first floor) to check-in and test prep.  All radiology patients and guests should use entrance C2 at [facility], accessed from [facility], even though the hospital's physical address listed is [address]    If scheduled at [facility], please arrive 15 mins early for check-in and test prep.  Please follow these instructions carefully (unless otherwise directed):  Hold all erectile dysfunction medications at least 3 days (72 hrs) prior to test.  On the Night Before the Test: Be sure to Drink plenty of water. Do not consume any caffeinated/decaffeinated beverages or chocolate 12 hours prior to your test. Do not take any antihistamines 12 hours prior to your test. If the patient has contrast allergy: Patient will need a prescription for Prednisone and very clear instructions (as follows): Prednisone 50 mg - take 13 hours prior to test Take another Prednisone 50 mg 7 hours prior to test Take another Prednisone 50 mg 1 hour prior to test Take Benadryl 50 mg 1 hour prior to test Patient must complete all four doses of above prophylactic medications. Patient will need a ride after test due to Benadryl.  On the Day of the Test: Drink plenty of water until 1 hour prior to the test. Do not eat any food 4 hours prior to the test. You may take your regular medications prior to the test.  Take metoprolol (Lopressor) two hours prior to test. HOLD Furosemide/Hydrochlorothiazide morning of the test. FEMALES- please wear underwire-free bra if available, avoid dresses & tight clothing   *For Clinical Staff only. Please instruct patient the following:* Heart Rate Medication Recommendations for Cardiac CT  Resting HR < 50 bpm  No medication  Resting HR 50-60 bpm and  BP >110/50 mmHG   Consider Metoprolol tartrate 25 mg PO 90-120 min prior to scan  Resting HR 60-65 bpm and BP >110/50 mmHG  Metoprolol  tartrate 50 mg PO 90-120 minutes prior to scan   Resting HR > 65 bpm and BP >110/50 mmHG  Metoprolol tartrate 100 mg PO 90-120 minutes prior to scan  Consider Ivabradine 10-15 mg PO or a calcium channel blocker for resting HR >60 bpm and contraindication to metoprolol tartrate  Consider Ivabradine 10-15 mg PO in combination with metoprolol tartrate for HR >80 bpm         After the Test: Drink plenty of water. After receiving IV contrast, you may experience a mild flushed feeling. This is normal. On occasion, you may experience a mild rash up to 24 hours after the test. This is not dangerous. If this occurs, you can take Benadryl 25 mg and increase your fluid intake. If you experience trouble breathing, this can be serious. If it is severe call 911 IMMEDIATELY. If it is mild, please call our office. If you take any of these medications: Glipizide/Metformin, Avandament, Glucavance, please do not take 48 hours after completing test unless otherwise instructed.  We will call to schedule your test 2-4 weeks out understanding that some insurance companies will need an authorization prior to the service being performed.   For non-scheduling related questions, please contact the cardiac imaging nurse navigator should you have any questions/concerns: Marchia Bond, Cardiac Imaging Nurse Navigator Gordy Clement, Cardiac Imaging Nurse Navigator [facility] Direct Office Dial: [phone]   For scheduling needs, including cancellations and rescheduling, please call [place], [phone].    Signed, Darreld Mclean, PA-C  08/28/2021 8:34 AM    [facility]

## 2021-08-15 DIAGNOSIS — H43392 Other vitreous opacities, left eye: Secondary | ICD-10-CM | POA: Diagnosis not present

## 2021-08-15 DIAGNOSIS — H25813 Combined forms of age-related cataract, bilateral: Secondary | ICD-10-CM | POA: Diagnosis not present

## 2021-08-15 DIAGNOSIS — H43811 Vitreous degeneration, right eye: Secondary | ICD-10-CM | POA: Diagnosis not present

## 2021-08-28 ENCOUNTER — Other Ambulatory Visit: Payer: Self-pay

## 2021-08-28 ENCOUNTER — Ambulatory Visit (INDEPENDENT_AMBULATORY_CARE_PROVIDER_SITE_OTHER): Payer: Medicare Other | Admitting: Student

## 2021-08-28 ENCOUNTER — Encounter: Payer: Self-pay | Admitting: Student

## 2021-08-28 VITALS — BP 126/78 | HR 88 | Ht 61.0 in | Wt 129.4 lb

## 2021-08-28 DIAGNOSIS — I2510 Atherosclerotic heart disease of native coronary artery without angina pectoris: Secondary | ICD-10-CM | POA: Diagnosis not present

## 2021-08-28 DIAGNOSIS — E785 Hyperlipidemia, unspecified: Secondary | ICD-10-CM | POA: Diagnosis not present

## 2021-08-28 DIAGNOSIS — R0789 Other chest pain: Secondary | ICD-10-CM

## 2021-08-28 DIAGNOSIS — I10 Essential (primary) hypertension: Secondary | ICD-10-CM

## 2021-08-28 DIAGNOSIS — I2584 Coronary atherosclerosis due to calcified coronary lesion: Secondary | ICD-10-CM | POA: Diagnosis not present

## 2021-08-28 LAB — COMPREHENSIVE METABOLIC PANEL
ALT: 30 IU/L (ref 0–32)
AST: 30 IU/L (ref 0–40)
Albumin/Globulin Ratio: 1.6 (ref 1.2–2.2)
Albumin: 4.5 g/dL (ref 3.7–4.7)
Alkaline Phosphatase: 71 IU/L (ref 44–121)
BUN/Creatinine Ratio: 15 (ref 12–28)
BUN: 11 mg/dL (ref 8–27)
Bilirubin Total: 1.3 mg/dL — ABNORMAL HIGH (ref 0.0–1.2)
CO2: 23 mmol/L (ref 20–29)
Calcium: 9.8 mg/dL (ref 8.7–10.3)
Chloride: 104 mmol/L (ref 96–106)
Creatinine, Ser: 0.73 mg/dL (ref 0.57–1.00)
Globulin, Total: 2.8 g/dL (ref 1.5–4.5)
Glucose: 81 mg/dL (ref 70–99)
Potassium: 3.8 mmol/L (ref 3.5–5.2)
Sodium: 143 mmol/L (ref 134–144)
Total Protein: 7.3 g/dL (ref 6.0–8.5)
eGFR: 87 mL/min/{1.73_m2} (ref >59)

## 2021-08-28 LAB — LIPID PANEL
Chol/HDL Ratio: 2.5 ratio (ref 0.0–4.4)
Cholesterol, Total: 171 mg/dL (ref 100–199)
HDL: 69 mg/dL (ref >39)
LDL Chol Calc (NIH): 79 mg/dL (ref 0–99)
Triglycerides: 135 mg/dL (ref 0–149)
VLDL Cholesterol Cal: 23 mg/dL (ref 5–40)

## 2021-08-28 MED ORDER — METOPROLOL TARTRATE 100 MG PO TABS: 100.0000 mg | ORAL_TABLET | Freq: Two times a day (BID) | ORAL | 0 refills | Status: DC

## 2021-08-28 NOTE — Patient Instructions (Signed)
Medication Instructions:  ?No Changes ?*If you need a refill on your cardiac medications before your next appointment, please call your pharmacy* ? ? ?Lab Work: ?CMET, Lipid Panel : Today ?If you have labs (blood work) drawn today and your tests are completely normal, you will receive your results only by: ?MyChart Message (if you have MyChart) OR ?A paper copy in the mail ?If you have any lab test that is abnormal or we need to change your treatment, we will call you to review the results. ? ? ?Testing/Procedures: [facility], [address] ?Your physician has requested that you have cardiac CT. Cardiac computed tomography (CT) is a painless test that uses an x-ray machine to take clear, detailed pictures of your heart. For further information please visit [email or web address]. Please follow instruction sheet as given. ?  ? ? ?Follow-Up: ?At [facility], you and your health needs are our priority.  As part of our continuing mission to provide you with exceptional heart care, we have created designated Provider Care Teams.  These Care Teams include your primary Cardiologist (physician) and Advanced Practice Providers (APPs -  Physician Assistants and Nurse Practitioners) who all work together to provide you with the care you need, when you need it. ? ?We recommend signing up for the patient portal called "MyChart".  Sign up information is provided on this After Visit Summary.  MyChart is used to connect with patients for Virtual Visits (Telemedicine).  Patients are able to view lab/test results, encounter notes, upcoming appointments, etc.  Non-urgent messages can be sent to your provider as well.   ?To learn more about what you can do with MyChart, go to [email or web address].   ? ?Your next appointment:   ?6 month(s) ? ?The format for your next appointment:   ?In Person ? ?Provider:   ?Elouise Munroe, MD   ? ? ? ? ?Your cardiac CT will be scheduled at one of the below locations:  ? ?[facility] ?[address] ?[place], [place] [address] ?[phone] ?If scheduled at [facility], please arrive at the [facility] and Children's Entrance (Entrance C2) of [facility] 30 minutes prior to test start time. ?You can use the FREE valet parking offered at entrance C (encouraged to control the heart rate for the test)  ?Proceed to the [facility] Radiology Department (first floor) to check-in and test prep. ? ?All radiology patients and guests should use entrance C2 at [facility], accessed from [facility], even though the hospital's physical address listed is [address] ? ? ? ?If scheduled at [facility], please arrive 15 mins early for check-in and test prep. ? ?Please follow these instructions carefully (unless otherwise directed): ? ?Hold all erectile dysfunction medications at least 3 days (72 hrs) prior to test. ? ?On the Night Before the Test: ?Be sure to Drink plenty of water. ?Do not consume any caffeinated/decaffeinated beverages or chocolate 12 hours prior to your test. ?Do not take any antihistamines 12 hours prior to your test. ?If the patient has contrast allergy: ?Patient will need a prescription for Prednisone and very clear instructions (as follows): ?Prednisone 50 mg - take 13 hours prior to test ?Take another Prednisone 50 mg 7 hours prior to test ?Take another Prednisone 50 mg 1 hour prior to test ?Take Benadryl 50 mg 1 hour prior to test ?Patient must complete all four doses of above prophylactic medications. ?Patient will need a ride after  test due to Benadryl. ? ?On the Day of the Test: ?Drink plenty of water until 1 hour prior to the test. ?Do not eat any food 4 hours prior to the test. ?You may take your regular medications prior to the test.  ?Take metoprolol (Lopressor) two hours prior to test. ?HOLD Furosemide/Hydrochlorothiazide morning of the test. ?FEMALES- please wear underwire-free bra if  available, avoid dresses & tight clothing ? ? ?*For Clinical Staff only. Please instruct patient the following:* ?Heart Rate Medication Recommendations for Cardiac CT  ?Resting HR < 50 bpm  ?No medication  ?Resting HR 50-60 bpm and BP >110/50 mmHG   ?Consider Metoprolol tartrate 25 mg PO 90-120 min prior to scan  ?Resting HR 60-65 bpm and BP >110/50 mmHG  ?Metoprolol tartrate 50 mg PO 90-120 minutes prior to scan   ?Resting HR > 65 bpm and BP >110/50 mmHG  ?Metoprolol tartrate 100 mg PO 90-120 minutes prior to scan  ?Consider Ivabradine 10-15 mg PO or a calcium channel blocker for resting HR >60 bpm and contraindication to metoprolol tartrate  ?Consider Ivabradine 10-15 mg PO in combination with metoprolol tartrate for HR >80 bpm   ? ?     ?After the Test: ?Drink plenty of water. ?After receiving IV contrast, you may experience a mild flushed feeling. This is normal. ?On occasion, you may experience a mild rash up to 24 hours after the test. This is not dangerous. If this occurs, you can take Benadryl 25 mg and increase your fluid intake. ?If you experience trouble breathing, this can be serious. If it is severe call 911 IMMEDIATELY. If it is mild, please call our office. ?If you take any of these medications: Glipizide/Metformin, Avandament, Glucavance, please do not take 48 hours after completing test unless otherwise instructed. ? ?We will call to schedule your test 2-4 weeks out understanding that some insurance companies will need an authorization prior to the service being performed.  ? ?For non-scheduling related questions, please contact the cardiac imaging nurse navigator should you have any questions/concerns: ?Marchia Bond, Cardiac Imaging Nurse Navigator ?Gordy Clement, Cardiac Imaging Nurse Navigator ?[facility] ?Direct Office Dial: [phone]  ? ?For scheduling needs, including cancellations and rescheduling, please call [place], [phone].  ?

## 2021-08-29 MED ORDER — METOPROLOL TARTRATE 100 MG PO TABS: 100.0000 mg | ORAL_TABLET | Freq: Once | ORAL | 0 refills | Status: DC

## 2021-08-29 NOTE — Addendum Note (Signed)
Addended by: Sande Rives on: 08/29/2021 05:41 PM ? ? Modules accepted: Orders ? ?

## 2021-09-06 ENCOUNTER — Telehealth (HOSPITAL_COMMUNITY): Payer: Self-pay | Admitting: Emergency Medicine

## 2021-09-06 NOTE — Telephone Encounter (Signed)
Reaching out to patient to offer assistance regarding upcoming cardiac imaging study; pt verbalizes understanding of appt date/time, parking situation and where to check in, pre-test NPO status and medications ordered, and verified current allergies; name and call back number provided for further questions should they arise ?Kelly Bond RN Navigator Cardiac Imaging ?[facility] ?[phone] office ?[phone] cell ? ?'100mg'$  metoprolol tartrate  ?Denies iv issues ?Arrival 800 ?

## 2021-09-08 ENCOUNTER — Ambulatory Visit (HOSPITAL_BASED_OUTPATIENT_CLINIC_OR_DEPARTMENT_OTHER)
Admission: RE | Admit: 2021-09-08 | Discharge: 2021-09-08 | Disposition: A | Discharge status: Home / Self Care | Payer: Medicare Other | Source: Ambulatory Visit | Attending: Cardiology | Admitting: Cardiology

## 2021-09-08 ENCOUNTER — Ambulatory Visit (HOSPITAL_COMMUNITY)
Admission: RE | Admit: 2021-09-08 | Discharge: 2021-09-08 | Disposition: A | Discharge status: Home / Self Care | Payer: Medicare Other | Source: Ambulatory Visit | Attending: Student | Admitting: Student

## 2021-09-08 ENCOUNTER — Other Ambulatory Visit: Payer: Self-pay | Admitting: Cardiology

## 2021-09-08 ENCOUNTER — Other Ambulatory Visit: Payer: Self-pay

## 2021-09-08 DIAGNOSIS — R931 Abnormal findings on diagnostic imaging of heart and coronary circulation: Secondary | ICD-10-CM | POA: Insufficient documentation

## 2021-09-08 DIAGNOSIS — I2510 Atherosclerotic heart disease of native coronary artery without angina pectoris: Secondary | ICD-10-CM

## 2021-09-08 DIAGNOSIS — I25119 Atherosclerotic heart disease of native coronary artery with unspecified angina pectoris: Secondary | ICD-10-CM | POA: Insufficient documentation

## 2021-09-08 DIAGNOSIS — R0789 Other chest pain: Secondary | ICD-10-CM | POA: Diagnosis not present

## 2021-09-08 MED ORDER — NITROGLYCERIN 0.4 MG SL SUBL
0.8000 mg | SUBLINGUAL_TABLET | Freq: Once | SUBLINGUAL | Status: AC
Start: 2021-09-08 — End: 2021-09-08
  Administered 2021-09-08: 0.8 mg via SUBLINGUAL

## 2021-09-08 MED ORDER — NITROGLYCERIN 0.4 MG SL SUBL
SUBLINGUAL_TABLET | SUBLINGUAL | Status: AC
  Filled 2021-09-08: qty 2

## 2021-09-08 MED ORDER — IOHEXOL 350 MG/ML SOLN
100.0000 mL | Freq: Once | INTRAVENOUS | Status: AC | PRN
  Administered 2021-09-08: 100 mL via INTRAVENOUS

## 2021-09-11 DIAGNOSIS — R931 Abnormal findings on diagnostic imaging of heart and coronary circulation: Secondary | ICD-10-CM

## 2021-09-11 DIAGNOSIS — I2510 Atherosclerotic heart disease of native coronary artery without angina pectoris: Secondary | ICD-10-CM | POA: Diagnosis not present

## 2021-09-14 ENCOUNTER — Other Ambulatory Visit: Payer: Self-pay | Admitting: Student

## 2021-09-14 DIAGNOSIS — E785 Hyperlipidemia, unspecified: Secondary | ICD-10-CM

## 2021-09-14 NOTE — Progress Notes (Signed)
Patient recently had coronary CTA which showed moderate CAD. Her LDL is slightly above goal at 79. She is currently on Lipitor '40mg'$  daily and Zetia '10mg'$  daily. She has had myalgias on higher dose of Lipitor as well as Crestor. Unfortunately her insurance will not cover Nexletol/Nexlizet so will refer to pharmD lipid clinic to discuss possible PCSK9 inhibitor or other options. ? ?Darreld Mclean, PA-C ?09/14/2021 9:01 PM ? ? ?

## 2021-09-29 ENCOUNTER — Other Ambulatory Visit: Payer: Self-pay | Admitting: Internal Medicine

## 2021-10-10 ENCOUNTER — Other Ambulatory Visit: Payer: Self-pay | Admitting: Internal Medicine

## 2021-10-23 ENCOUNTER — Ambulatory Visit: Payer: Medicare Other | Admitting: Pharmacist Clinician (PhC)/ Clinical Pharmacy Specialist

## 2021-10-23 VITALS — BP 150/80 | HR 66 | Resp 16 | Ht 61.0 in | Wt 128.4 lb

## 2021-10-23 DIAGNOSIS — E785 Hyperlipidemia, unspecified: Secondary | ICD-10-CM | POA: Diagnosis not present

## 2021-10-23 NOTE — Progress Notes (Signed)
10/23/2021 ?Arlean Hopping ?November 27, 1945 ?[id number] ? ? ?HPI:  Kelly Harrington is a 76 y.o. female patient of Dr Margaretann Loveless, who presents today for a lipid clinic evaluation.  See pertinent past medical history below.  She was most recently seen by Sande Rives PA, and it was noted that she was tolerating the atorvastatin 40 mg, but did have myalgias on the 80 mg.  ?Started ezetimibe in Dec ? ?Past Medical History: ?CAD Coronary calcium score 367 (84th percentile); atypical chest pain  ?hypertension Controlled on amlodipine 5 mg   ? ?Current Medications: atorvastatin 40 mg qd, ezetimibe 10 mg qd ? ?Cholesterol Goals: LDL < 70 ?  ?Intolerant/previously tried: cannot tolerate higher doses of atorvastatin ? ?Family history: both parents, dad died 17, had MI at 73; mother had MI late 58's; still living at 68; siblings all healthy; no children ? ?Diet: moslty home cooked; mix of meats; vegetables are fresh; likes green on the table; plenty of salad; eats skinny popcorn ? ?Exercise:  previously walked 5 mi/day on treadmill; about 76 years old, wore it out - needs to get new one ? ?Labs: 3/23:  TC 171, TG 135, HDL 36, LDL 79 ? ? ?Current Outpatient Medications  ?Medication Sig Dispense Refill  ? amLODipine (NORVASC) 5 MG tablet Take 1 tablet (5 mg total) by mouth daily. 90 tablet 3  ? aspirin 81 MG chewable tablet 1 tablet    ? atorvastatin (LIPITOR) 40 MG tablet Take 1 tablet (40 mg total) by mouth daily. 90 tablet 3  ? cetirizine (ZYRTEC) 10 MG tablet Take 10 mg by mouth daily as needed for allergies.    ? ezetimibe (ZETIA) 10 MG tablet Take 1 tablet (10 mg total) by mouth daily. 90 tablet 3  ? polyethylene glycol (MIRALAX / GLYCOLAX) 17 g packet Take 17 g by mouth daily.    ? ?No current facility-administered medications for this visit.  ? ? ?Allergies  ?Allergen Reactions  ? Codeine Nausea And Vomiting  ? ? ?Past Medical History:  ?Diagnosis Date  ? Coronary artery calcification   ? noted on CT scan in 10/2019  ?  Hyperlipidemia   ? Hypertension   ? SBO (small bowel obstruction) ([place])   ? ? ?Blood pressure (!) 150/80, pulse 66, resp. rate 16, height '5\' 1"'$  (1.549 m), weight 128 lb 6.4 oz (58.2 kg), SpO2 99 %. ? ? ?Hyperlipidemia ?Patient with hyperlipidemia, not to LDL goal of < 70 on atorvastatin 40 and ezetimibe 10.  Reviewed options for lowering LDL cholesterol, including PCSK-9 inhibitors, bempedoic acid and inclisiran.  Discussed mechanisms of action, dosing, side effects and potential decreases in LDL cholesterol.  Also reviewed cost information and potential options for patient assistance.  Answered all patient questions.  Patient believes she didn't start the ezetimibe until December/January, so would like to continue for another month or two before making a decision to add medication.  She will repeat labs in a few weeks then we can talk on the phone about what her best option is.   ? ? ?Tommy Medal PharmD CPP [facility] ?[address] [address] ?[place], [place] [address] ?[phone] ? ? ? ?

## 2021-10-23 NOTE — Assessment & Plan Note (Signed)
Patient with hyperlipidemia, not to LDL goal of < 70 on atorvastatin 40 and ezetimibe 10.  Reviewed options for lowering LDL cholesterol, including PCSK-9 inhibitors, bempedoic acid and inclisiran.  Discussed mechanisms of action, dosing, side effects and potential decreases in LDL cholesterol.  Also reviewed cost information and potential options for patient assistance.  Answered all patient questions.  Patient believes she didn't start the ezetimibe until December/January, so would like to continue for another month or two before making a decision to add medication.  She will repeat labs in a few weeks then we can talk on the phone about what her best option is.   ? ?

## 2021-10-23 NOTE — Patient Instructions (Signed)
Your Results: ?           ? Your most recent labs Goal  ?Total Cholesterol 171 < 200  ?Triglycerides 135 < 150  ?HDL (happy/good cholesterol) 36 > 40  ?LDL (lousy/bad cholesterol 79 < 70  ? ?Plan to repeat cholesterol labs when you are able.  If your LDL is still > 70, we can talk on the phone to decide what the next best option is.   ? Repatha - injection once every 14 days - lowers LDL cholesterol 50-60% ? Nexletol - once daily tablet  - lowers LDL cholesterol about 30-35% ? ?Patient Assistance:  The [employer] foundation offers assistance to help pay for medication copays.  They will cover copays for all cholesterol lowering meds, including statins, fibrates, omega-3 oils, ezetimibe, Repatha, Praluent, Nexletol, Nexlizet.  The cards are usually good for $2,500 or 12 months, whichever comes first. ?Go to healthwellfoundation.org ?Click on ?Apply Now? ?Answer questions as to whom is applying (patient or representative) ?Your disease fund will be ?hypercholesterolemia - Medicare access? ?They will ask questions about finances and which medications you are taking for cholesterol ?When you submit, the approval is usually within minutes.  You will need to print the card information from the site ?You will need to show this information to your pharmacy, they will bill your Medicare Part D plan first -then bill [employer] --for the copay.   ?You can also call them at [phone], although the hold times can be quite long.  ? ?Thank you for choosing [facility]  ? ?

## 2021-10-27 ENCOUNTER — Telehealth: Payer: Self-pay | Admitting: Internal Medicine

## 2021-10-27 NOTE — Telephone Encounter (Signed)
?*  STAT* If patient is at the pharmacy, call can be transferred to refill team. ? ? ?1. Which medications need to be refilled? (please list name of each medication and dose if known)  ? atorvastatin (LIPITOR) 40 MG tablet  ? ? ezetimibe (ZETIA) 10 MG tablet  ? ?2. Which pharmacy/location (including street and city if local pharmacy) is medication to be sent to? [facility] ([place], [place] ? ?3. Do they need a 30 day or 90 day supply?  ?90 day ? ?Pt states that pharmacy contacted her and said they need new scripts for above medications. Please advise ? ?

## 2021-10-30 MED ORDER — EZETIMIBE 10 MG PO TABS
10.0000 mg | ORAL_TABLET | Freq: Every day | ORAL | 3 refills | Status: DC
Start: 2021-10-30 — End: 2022-07-17

## 2021-10-30 MED ORDER — ATORVASTATIN CALCIUM 40 MG PO TABS: 40.0000 mg | ORAL_TABLET | Freq: Every day | ORAL | 3 refills | Status: DC

## 2021-10-30 NOTE — Telephone Encounter (Signed)
Refills sent to [facility]. ?

## 2022-02-14 DIAGNOSIS — Z0000 Encounter for general adult medical examination without abnormal findings: Secondary | ICD-10-CM | POA: Diagnosis not present

## 2022-02-14 DIAGNOSIS — E785 Hyperlipidemia, unspecified: Secondary | ICD-10-CM | POA: Diagnosis not present

## 2022-02-14 LAB — LIPID PANEL
Chol/HDL Ratio: 2.2 ratio (ref 0.0–4.4)
Cholesterol, Total: 149 mg/dL (ref 100–199)
HDL: 69 mg/dL (ref >39)
LDL Chol Calc (NIH): 61 mg/dL (ref 0–99)
Triglycerides: 104 mg/dL (ref 0–149)
VLDL Cholesterol Cal: 19 mg/dL (ref 5–40)

## 2022-02-14 LAB — HEPATIC FUNCTION PANEL
ALT: 24 IU/L (ref 0–32)
AST: 34 IU/L (ref 0–40)
Albumin: 4.8 g/dL (ref 3.8–4.8)
Alkaline Phosphatase: 67 IU/L (ref 44–121)
Bilirubin Total: 1.8 mg/dL — ABNORMAL HIGH (ref 0.0–1.2)
Bilirubin, Direct: 0.33 mg/dL (ref 0.00–0.40)
Total Protein: 7.5 g/dL (ref 6.0–8.5)

## 2022-02-26 ENCOUNTER — Encounter: Payer: Self-pay | Admitting: Internal Medicine

## 2022-02-26 ENCOUNTER — Ambulatory Visit: Payer: Medicare Other | Attending: Internal Medicine | Admitting: Internal Medicine

## 2022-02-26 VITALS — BP 125/60 | HR 56 | Ht 61.0 in | Wt 124.4 lb

## 2022-02-26 DIAGNOSIS — I2510 Atherosclerotic heart disease of native coronary artery without angina pectoris: Secondary | ICD-10-CM | POA: Diagnosis not present

## 2022-02-26 DIAGNOSIS — E785 Hyperlipidemia, unspecified: Secondary | ICD-10-CM | POA: Diagnosis not present

## 2022-02-26 DIAGNOSIS — I10 Essential (primary) hypertension: Secondary | ICD-10-CM

## 2022-02-26 NOTE — Patient Instructions (Signed)
Medication Instructions:  No Changes In Medications at this time.  *If you need a refill on your cardiac medications before your next appointment, please call your pharmacy*  Follow-Up: At [facility], you and your health needs are our priority.  As part of our continuing mission to provide you with exceptional heart care, we have created designated Provider Care Teams.  These Care Teams include your primary Cardiologist (physician) and Advanced Practice Providers (APPs -  Physician Assistants and Nurse Practitioners) who all work together to provide you with the care you need, when you need it.  Your next appointment:   6 month(s)  The format for your next appointment:   In Person  Provider:   Gayatri A Acharya, MD          

## 2022-02-26 NOTE — Progress Notes (Addendum)
Cardiology Office Note:    Date:  02/26/2022   ID:  [name], DOB 09/01/1945, MRN [id number]  PCP:  Alroy Dust, Carlean Jews.Marlou Sa, MD  Cardiologist:  Elouise Munroe, MD  Electrophysiologist:  None   Referring MD: Aurea Graff.Marlou Sa, MD   Chief Complaint/Reason for Referral: Follow up CAD  History of Present Illness:    Kelly Harrington is a 76 y.o. female with a history of acute appendicitis with laparoscopic appendectomy 10/06/2019 with Dr. 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  She is very active at baseline, has no cardiopulmonary symptoms, and for abnormal ECG we obtain an echocardiogram which was unremarkable for structural issues.  While in hospital she was noted to have a small bowel obstruction requiring NG tube placement.  She has been feeling well over the last several months.  Has been seen by Nehemiah Massed Pharm.D. for statin intolerance and med titration for hyperlipidemia, and has seen my colleague Sande Rives, PA for chest discomfort.  During that evaluation she underwent coronary CTA which demonstrated moderate proximal and mid LAD mixed atherosclerotic plaque, with no hemodynamically significant stenosis.  Minimal plaque in the circumflex and RCA.  We discussed these results in detail today.  She has been doing well on amlodipine 5 mg daily for blood pressure and BP is well controlled.  Continues on aspirin 81 mg daily, atorvastatin 40 mg daily and Zetia 10 mg daily.  Lipid panel is excellent today with LDL 61, triglycerides 104, HDL 69.  She is not having any lower extremity swelling or myalgias on current therapy.  She has had 1 isolated episode of resting chest pressure.  Nothing with exercise.  She has been able to resume her activities at the gym.  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  We discussed in detail factors to slow progression of coronary artery disease and secondary prevention of CAD.  We discussed vegan diet and PCSK9 inhibitors.   02/2021 She contracted COVID in early July but feels she has made a full recovery and only had moderate cold symptoms with fatigue and mild shortness of breath.  She has been doing well with atorvastatin 40 mg daily and only forgets to take it every once in a while when she goes to take care of her mother.  We discussed improved lipid panel but not quite at goal and after review of her labs we discussed starting Zetia 10 mg daily.  Prescription has been called in and this will arrive to her house soon.  She denies chest pain or shortness of breath.  She has not been able to walk as much lately since her outdoor walking friend has been unable to walk with her and her treadmill at home broke.  We discussed getting back to her walking regimen which she was so diligently doing for years prior to her hospitalization last year.  The patient denies chest pain, chest pressure, dyspnea at rest or with exertion, palpitations, PND, orthopnea, or leg swelling. Denies cough, fever, chills. Denies nausea, vomiting. Denies syncope or presyncope. Denies dizziness or lightheadedness.    11/17/19 - we started asa 81 mg daily and recommended resuming atorvastatin. She was on atorvastatin 40 mg MWF, due to myalgias with daily dosing. 06/01/20 - still  on MWF dosing, we discussed going to daily dosing. She had one episode recently of right sided chest pressure without inciting factors, not related to activity. This has been an isolated episode. 12/02/20 - When she first started taking 40 mg atorvastatin daily, she developed bilateral LE muscle cramps. However, the cramping has resolved at this time. In 08/2020 she was taking atorvastatin daily but sporadically.  She has made an effort to be much more consistent lately. We discussed adding Zetia. For her diet, she typically drinks some caffeine. She has not noticed feeling any palpitations with note of PACs on ECG today. At home, her blood pressure is typically averaging in the 790'W systolic. Most of her salt intake comes from salty snacks, and she is trying to eliminate this and eat more fruit for the benefit of her BP. For exercise, she continues walking on her treadmill at least 5 days a week 45 mins. She has also started Youtube exercises 3 days a week. When she exercises, she notices her heart rate may raise to the 120s. She asks if this is a dangerous level for her age. I advised that her optimum aerobic heart rate should fall between 110-130 bpm for her age.  Past Medical History:  Diagnosis Date   Coronary artery calcification    noted on CT scan in 10/2019   Hyperlipidemia    Hypertension    SBO (small bowel obstruction) (HCC)     Past Surgical History:  Procedure Laterality Date   APPENDECTOMY     LAPAROSCOPIC APPENDECTOMY N/A 10/06/2019   Procedure: APPENDECTOMY LAPAROSCOPIC;  Surgeon: Autumn Messing III, MD;  Location: [facility];  Service: General;  Laterality: N/A;   ROTATOR CUFF REPAIR     TRIGGER FINGER RELEASE      Current Medications: Current Meds  Medication Sig   amLODipine (NORVASC) 5 MG tablet Take 1 tablet (5 mg total) by mouth daily.   aspirin 81 MG chewable tablet 1 tablet   atorvastatin (LIPITOR) 40 MG tablet Take 1 tablet (40 mg total) by mouth daily.   cetirizine (ZYRTEC) 10 MG tablet Take 10 mg by mouth daily as needed for allergies.   ezetimibe (ZETIA) 10 MG tablet Take 1 tablet (10 mg total) by mouth daily.   polyethylene glycol (MIRALAX / GLYCOLAX) 17 g packet Take 17 g by mouth daily.     Allergies:   Codeine   Social History   Tobacco Use   Smoking status: Never   Smokeless tobacco: Never  Vaping Use   Vaping Use: Never used  Substance Use Topics   Alcohol  use: No   Drug use: No     Family History: The patient's family history includes Heart failure in her father and mother; Hypertension in her father and mother.  ROS:   Please see the history of present illness.    All other systems reviewed and are negative.  EKGs/Labs/Other Studies Reviewed:    The following studies were reviewed today:  Echo 10/28/2019:  1. Left ventricular ejection fraction, by estimation, is 70 to 75%. The  left ventricle has hyperdynamic function. The left ventricle has no  regional wall motion abnormalities. There is mild concentric left  ventricular hypertrophy. Left ventricular  diastolic parameters are consistent with Grade I diastolic dysfunction  (impaired relaxation).   2. Right ventricular systolic function is normal. The right ventricular  size is normal.   3. The mitral valve is normal in structure. No evidence of mitral valve  regurgitation. No evidence of  mitral stenosis.   4. The aortic valve is normal in structure. Aortic valve regurgitation is  not visualized. No aortic stenosis is present.   5. The inferior vena cava is normal in size with greater than 50%  respiratory variability, suggesting right atrial pressure of 3 mmHg.  EKG: Sinus bradycardia, PAC, poor R wave progression. Rate 56. 12/02/2020: SR with PAC's, rate 65 bpm 06/01/2020: SR with sinus arrhythmia, possible LAE  Recent Labs: 08/28/2021: BUN 11; Creatinine, Ser 0.73; Potassium 3.8; Sodium 143 02/14/2022: ALT 24  Recent Lipid Panel    Component Value Date/Time   CHOL 149 02/14/2022 0947   TRIG 104 02/14/2022 0947   HDL 69 02/14/2022 0947   CHOLHDL 2.2 02/14/2022 0947   CHOLHDL 5.1 10/29/2019 0307   VLDL 32 10/29/2019 0307   LDLCALC 61 02/14/2022 0947    Physical Exam:    VS:  BP 125/60   Pulse (!) 56   Ht '5\' 1"'$  (1.549 m)   Wt 124 lb 6.4 oz (56.4 kg)   SpO2 97%   BMI 23.51 kg/m     Wt Readings from Last 5 Encounters:  02/26/22 124 lb 6.4 oz (56.4 kg)  10/23/21  128 lb 6.4 oz (58.2 kg)  08/28/21 129 lb 6.4 oz (58.7 kg)  05/29/21 129 lb 12.5 oz (58.9 kg)  03/06/21 132 lb 6.4 oz (60.1 kg)    Constitutional: No acute distress Eyes: sclera non-icteric, normal conjunctiva and lids ENMT: normal dentition, moist mucous membranes Cardiovascular: regular rhythm, normal rate, no murmurs. S1 and S2 normal. Radial pulses normal bilaterally. No jugular venous distention.  Respiratory: clear to auscultation bilaterally GI : normal bowel sounds, soft and nontender. No distention.   MSK: extremities warm, well perfused. No edema.  NEURO: grossly nonfocal exam, moves all extremities. PSYCH: alert and oriented x 3, normal mood and affect.   ASSESSMENT:    1. Coronary artery disease involving native coronary artery of native heart without angina pectoris   2. Hyperlipidemia, unspecified hyperlipidemia type      PLAN:    Coronary artery disease Hyperlipidemia  -She has been following with [facility] pharmacist, stable on atorvastatin 40 mg daily and Zetia 10 mg daily, with excellent lipid panel.  Continue. - Continue ASA 81 mg daily for CAC.  Hypertension  -Blood pressure well controlled on amlodipine 5 mg daily, continue.  Total time of encounter: 30 minutes total time of encounter, including 20 minutes spent in face-to-face patient care on the date of this encounter. This time includes coordination of care and counseling regarding above mentioned problem list. Remainder of non-face-to-face time involved reviewing chart documents/testing relevant to the patient encounter and documentation in the medical record. I have independently reviewed documentation from referring provider.   Cherlynn Kaiser, MD, [facility]     Medication Adjustments/Labs and Tests Ordered: Current medicines are reviewed at length with the patient today.  Concerns regarding medicines are outlined above.   Orders Placed This Encounter  Procedures   EKG 12-Lead       No orders of the defined types were placed in this encounter.    Patient Instructions  Medication Instructions:  No Changes In Medications at this time.  *If you need a refill on your cardiac medications before your next appointment, please call your pharmacy*  Follow-Up: At [facility], you and your health needs are our priority.  As part of our continuing mission to provide you with exceptional heart care, we have created designated  Provider Care Teams.  These Care Teams include your primary Cardiologist (physician) and Advanced Practice Providers (APPs -  Physician Assistants and Nurse Practitioners) who all work together to provide you with the care you need, when you need it.  Your next appointment:   6 month(s)  The format for your next appointment:   In Person  Provider:   Elouise Munroe, MD

## 2022-04-02 ENCOUNTER — Other Ambulatory Visit (HOSPITAL_BASED_OUTPATIENT_CLINIC_OR_DEPARTMENT_OTHER): Payer: Self-pay

## 2022-04-02 MED ORDER — COMIRNATY 30 MCG/0.3ML IM SUSP
INTRAMUSCULAR | 0 refills | Status: AC
  Filled 2022-04-02: qty 0.3, 1d supply, fill #0

## 2022-04-22 ENCOUNTER — Other Ambulatory Visit: Payer: Self-pay | Admitting: Student

## 2022-05-17 DIAGNOSIS — Z1231 Encounter for screening mammogram for malignant neoplasm of breast: Secondary | ICD-10-CM | POA: Diagnosis not present

## 2022-06-01 DIAGNOSIS — M85851 Other specified disorders of bone density and structure, right thigh: Secondary | ICD-10-CM | POA: Diagnosis not present

## 2022-06-01 DIAGNOSIS — Z780 Asymptomatic menopausal state: Secondary | ICD-10-CM | POA: Diagnosis not present

## 2022-06-01 DIAGNOSIS — M85852 Other specified disorders of bone density and structure, left thigh: Secondary | ICD-10-CM | POA: Diagnosis not present

## 2022-07-11 ENCOUNTER — Other Ambulatory Visit: Payer: Self-pay | Admitting: Internal Medicine

## 2022-07-14 DIAGNOSIS — H2512 Age-related nuclear cataract, left eye: Secondary | ICD-10-CM | POA: Diagnosis not present

## 2022-07-17 NOTE — Telephone Encounter (Signed)
Patient is calling to check on these medications being sent due to the pharmacy not receiving them. Please advise.

## 2022-08-22 DIAGNOSIS — H43811 Vitreous degeneration, right eye: Secondary | ICD-10-CM | POA: Diagnosis not present

## 2022-08-22 DIAGNOSIS — H43392 Other vitreous opacities, left eye: Secondary | ICD-10-CM | POA: Diagnosis not present

## 2022-08-22 DIAGNOSIS — H25813 Combined forms of age-related cataract, bilateral: Secondary | ICD-10-CM | POA: Diagnosis not present

## 2022-08-28 NOTE — Progress Notes (Signed)
Cardiology Office Note:    Date:  08/29/2022   ID:  Kelly Harrington, DOB October 01, 1945, MRN [id number]  PCP:  Alroy Dust, Carlean Jews.Marlou Sa, MD  Cardiologist:  Elouise Munroe, MD  Electrophysiologist:  None   Referring MD: Aurea Graff.Marlou Sa, MD   Chief Complaint/Reason for Referral: Follow up CAD  History of Present Illness:    Kelly Harrington is a 77 y.o. female with a history of acute appendicitis with laparoscopic appendectomy 10/06/2019 with Dr. 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  She is very active at baseline, has no cardiopulmonary symptoms, and for abnormal ECG we obtained an echocardiogram which was unremarkable for structural issues.  While in the hospital she was noted to have a small bowel obstruction requiring NG tube placement.  At her visit 11/2019 she had resumed her atorvastatin 40 mg on MWF (developed myalgias with daily dosing). We also started 81 mg ASA daily. By the time of her follow-up 11/2020 she was taking atorvastatin 40 mg daily. Her blood pressure was mildly elevated which she attributed to salt sensitivity. She planned to work on cutting out sodium from her diet. Her ECG showed PACs, but she denied noticing any palpitations. In September 2022 we discussed adding Zetia 10 mg daily. She had myalgias with uptitration of atorvastatin; we discussed not increasing to 80 mg unless absolutely necessary. We discussed getting back to her walking regimen which she was so diligently doing for years prior to her hospitalization.  Had been seen by Nehemiah Massed Pharm.D. for statin intolerance and med titration for hyperlipidemia, and had seen my colleague Sande Rives, PA for chest discomfort.  During that evaluation she underwent coronary CTA 08/2021 which demonstrated  moderate proximal and mid LAD mixed atherosclerotic plaque, with no hemodynamically significant stenosis.  Minimal plaque in the circumflex and RCA.  We discussed these results in detail.  At her last appointment, blood pressures were well controlled on 5 mg amlodipine daily. Continued on aspirin 81 mg daily, atorvastatin 40 mg daily, and Zetia 10 mg daily. Lipid panel 01/2022 was excellent with LDL 61, triglycerides 104, HDL 69.  She reported 1 isolated episode of resting chest pressure. She had been able to resume her activities at the gym. 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 We discussed in detail factors to slow progression of coronary artery disease and secondary prevention of CAD.  We discussed vegan diet and PCSK9 inhibitors.  Today, she states she is feeling well. Every now and then she has a little feeling or pressure in her chest, but the palpitations are brief and difficult to describe.  Lately she is feeling improved and has successfully lost weight with her dietary and exercise changes. About 1 year go she wasn't focused on her diet. She was eating a lot of processed foods and enjoyed salty snacks. Now she has made significant progress towards following a vegan/mediterranean diet since her last visit. She is avoiding processed foods. Twice a week she eats salmon or sardines. Her meals are cooked fresh and she consumes a lot of salads with raw vegetables.  For exercise she is riding her stationary bike (sitting upright) as much as she is able. However, she is concerned that her heart rate rises to 108-117 bpm and wonders how high it should be during exercise. Usually she stops and backs  off when her heart rate is as high as 117 bpm.  We reviewed that a good range for her is about 125-145 bpm for maximum cardiovascular health benefits.    She denies any LE swelling on 5 mg amlodipine. Reportedly she was started on a calcium supplement after a recent bone scan. We  discussed the importance of strength training along with her cardiovascular exercise.  She continues to be a primary caretaker for her mother.  She denies any chest pain, shortness of breath, or peripheral edema. No lightheadedness, headaches, syncope, orthopnea, or PND.   Past Medical History:  Diagnosis Date   Coronary artery calcification    noted on CT scan in 10/2019   Hyperlipidemia    Hypertension    SBO (small bowel obstruction) (HCC)     Past Surgical History:  Procedure Laterality Date   APPENDECTOMY     LAPAROSCOPIC APPENDECTOMY N/A 10/06/2019   Procedure: APPENDECTOMY LAPAROSCOPIC;  Surgeon: Autumn Messing III, MD;  Location: [facility];  Service: General;  Laterality: N/A;   ROTATOR CUFF REPAIR     TRIGGER FINGER RELEASE      Current Medications: Current Meds  Medication Sig   amLODipine (NORVASC) 5 MG tablet TAKE 1 TABLET BY MOUTH DAILY   aspirin 81 MG chewable tablet 1 tablet   atorvastatin (LIPITOR) 40 MG tablet TAKE 1 TABLET BY MOUTH DAILY   Calcium Carbonate (CALCIUM 500 PO) Take by mouth.   cetirizine (ZYRTEC) 10 MG tablet Take 10 mg by mouth daily as needed for allergies.   COVID-19 mRNA Vac-TriS, Pfizer, (COMIRNATY) SUSP injection Inject into the muscle.   ezetimibe (ZETIA) 10 MG tablet TAKE 1 TABLET BY MOUTH DAILY   polyethylene glycol (MIRALAX / GLYCOLAX) 17 g packet Take 17 g by mouth daily.     Allergies:   Codeine   Social History   Tobacco Use   Smoking status: Never   Smokeless tobacco: Never  Vaping Use   Vaping Use: Never used  Substance Use Topics   Alcohol use: No   Drug use: No     Family History: The patient's family history includes Heart failure in her father and mother; Hypertension in her father and mother.  ROS:   Please see the history of present illness.    (+) Rare palpitations All other systems reviewed and are negative.  EKGs/Labs/Other Studies Reviewed:    The following studies were reviewed today:  Coronary CTA   09/08/2021: IMPRESSION: 1. Coronary calcium score of 367. This was 65 percentile for age and sex matched control.   2. Normal coronary origin with right dominance.   3. CAD-RADS 3. Moderate stenosis. Consider symptom-guided anti-ischemic pharmacotherapy as well as risk factor modification per guideline directed care. Additional analysis with CT FFR will be submitted.  CT FFR ANALYSIS: FINDINGS: FFRct analysis was performed on the original cardiac CT angiogram dataset. Diagrammatic representation of the FFRct analysis is provided in a separate PDF document in PACS. This dictation was created using the PDF document and an interactive 3D model of the results. 3D model is not available in the EMR/PACS. Normal FFR range is >0.80. Indeterminate (grey) zone is 0.76-0.80.   1. Left Main: FFR = 0.95 2. LAD: Proximal FFR = 0.92, mid FFR = 0.84, distal FFR = 0.75 3. LCX: Proximal FFR = 0.93, distal FFR = 0.92 4. RCA: Proximal FFR = 0.95, mid FFR =0.93, distal FFR = 0.72   IMPRESSION: 1.  CT FFR analysis showed no significant stenosis.  Echo  10/28/2019:  1. Left ventricular ejection fraction, by estimation, is 70 to 75%. The  left ventricle has hyperdynamic function. The left ventricle has no  regional wall motion abnormalities. There is mild concentric left  ventricular hypertrophy. Left ventricular  diastolic parameters are consistent with Grade I diastolic dysfunction  (impaired relaxation).   2. Right ventricular systolic function is normal. The right ventricular  size is normal.   3. The mitral valve is normal in structure. No evidence of mitral valve  regurgitation. No evidence of mitral stenosis.   4. The aortic valve is normal in structure. Aortic valve regurgitation is  not visualized. No aortic stenosis is present.   5. The inferior vena cava is normal in size with greater than 50%  respiratory variability, suggesting right atrial pressure of 3 mmHg.  EKG:  EKG is personally  reviewed. 08/29/2022:  Sinus rhythm. PAC. Poor R wave progression.  02/26/2022: Sinus bradycardia, PAC, poor R wave progression. Rate 56. 12/02/2020: SR with PAC's, rate 65 bpm 06/01/2020: SR with sinus arrhythmia, possible LAE  Recent Labs: 02/14/2022: ALT 24   Recent Lipid Panel    Component Value Date/Time   CHOL 149 02/14/2022 0947   TRIG 104 02/14/2022 0947   HDL 69 02/14/2022 0947   CHOLHDL 2.2 02/14/2022 0947   CHOLHDL 5.1 10/29/2019 0307   VLDL 32 10/29/2019 0307   LDLCALC 61 02/14/2022 0947    Physical Exam:    VS:  BP 112/70   Pulse 62   Ht 5\' 1"  (1.549 m)   Wt 116 lb 6.4 oz (52.8 kg)   SpO2 96%   BMI 21.99 kg/m     Wt Readings from Last 5 Encounters:  08/29/22 116 lb 6.4 oz (52.8 kg)  02/26/22 124 lb 6.4 oz (56.4 kg)  10/23/21 128 lb 6.4 oz (58.2 kg)  08/28/21 129 lb 6.4 oz (58.7 kg)  05/29/21 129 lb 12.5 oz (58.9 kg)    Constitutional: No acute distress Eyes: sclera non-icteric, normal conjunctiva and lids ENMT: normal dentition, moist mucous membranes Cardiovascular: regular rhythm, normal rate, no murmurs. S1 and S2 normal. Radial pulses normal bilaterally. No jugular venous distention.  Respiratory: clear to auscultation bilaterally GI : normal bowel sounds, soft and nontender. No distention.   MSK: extremities warm, well perfused. No edema.  NEURO: grossly nonfocal exam, moves all extremities. PSYCH: alert and oriented x 3, normal mood and affect.   ASSESSMENT:    1. Coronary artery disease involving native coronary artery of native heart without angina pectoris   2. Hyperlipidemia, unspecified hyperlipidemia type   3. Essential hypertension   4. Coronary artery calcification   5. Cardiac risk counseling     PLAN:    Coronary artery disease Hyperlipidemia  -She has been following with [facility] pharmacist, stable on atorvastatin 40 mg daily and Zetia 10 mg daily, with excellent lipid panel.  Continue. - Continue ASA 81 mg daily for  CAC.  Hypertension  -Blood pressure well controlled on amlodipine 5 mg daily, continue.  Cardiac risk couseling -Will check lipids and Lp(a) -Check CMP for kidney function and liver per patient preference.  Follow-up:  6 months per patient preference.  Total time of encounter: 30 minutes total time of encounter, including 20 minutes spent in face-to-face patient care on the date of this encounter. This time includes coordination of care and counseling regarding above mentioned problem list. Remainder of non-face-to-face time involved reviewing chart documents/testing relevant to the patient encounter and documentation in the medical record. I  have independently reviewed documentation from referring provider.   Cherlynn Kaiser, MD, [facility]   Medication Adjustments/Labs and Tests Ordered: Current medicines are reviewed at length with the patient today.  Concerns regarding medicines are outlined above.   Orders Placed This Encounter  Procedures   EKG 12-Lead   No orders of the defined types were placed in this encounter.  Patient Instructions  Medication Instructions:  No Changes In Medications at this time.  *If you need a refill on your cardiac medications before your next appointment, please call your pharmacy*  Lab Work: BLOOD WORK TODAY  If you have labs (blood work) drawn today and your tests are completely normal, you will receive your results only by: [place] (if you have MyChart) OR A paper copy in the mail If you have any lab test that is abnormal or we need to change your treatment, we will call you to review the results.  Follow-Up: At [facility], you and your health needs are our priority.  As part of our continuing mission to provide you with exceptional heart care, we have created designated Provider Care Teams.  These Care Teams include your primary Cardiologist (physician) and Advanced Practice Providers (APPs -   Physician Assistants and Nurse Practitioners) who all work together to provide you with the care you need, when you need it.  Your next appointment:   6 month(s)  Provider:   Elouise Munroe, MD      [facility],acting as a scribe for Elouise Munroe, MD.,have documented all relevant documentation on the behalf of Elouise Munroe, MD,as directed by  Elouise Munroe, MD while in the presence of Elouise Munroe, MD.  I, Elouise Munroe, MD, have reviewed all documentation for the visit on 08/29/2022. The documentation on today's date of service for the exam, diagnosis, procedures, and orders are all accurate and complete.

## 2022-08-29 ENCOUNTER — Encounter: Payer: Self-pay | Admitting: Internal Medicine

## 2022-08-29 ENCOUNTER — Ambulatory Visit: Payer: Medicare Other | Attending: Internal Medicine | Admitting: Internal Medicine

## 2022-08-29 VITALS — BP 112/70 | HR 62 | Ht 61.0 in | Wt 116.4 lb

## 2022-08-29 DIAGNOSIS — E785 Hyperlipidemia, unspecified: Secondary | ICD-10-CM | POA: Diagnosis not present

## 2022-08-29 DIAGNOSIS — Z7189 Other specified counseling: Secondary | ICD-10-CM

## 2022-08-29 DIAGNOSIS — I2584 Coronary atherosclerosis due to calcified coronary lesion: Secondary | ICD-10-CM

## 2022-08-29 DIAGNOSIS — I2510 Atherosclerotic heart disease of native coronary artery without angina pectoris: Secondary | ICD-10-CM | POA: Diagnosis not present

## 2022-08-29 DIAGNOSIS — I10 Essential (primary) hypertension: Secondary | ICD-10-CM | POA: Diagnosis not present

## 2022-08-29 LAB — CBC

## 2022-08-29 NOTE — Patient Instructions (Signed)
Medication Instructions:  No Changes In Medications at this time.  *If you need a refill on your cardiac medications before your next appointment, please call your pharmacy*  Lab Work: BLOOD WORK TODAY  If you have labs (blood work) drawn today and your tests are completely normal, you will receive your results only by: [place] (if you have MyChart) OR A paper copy in the mail If you have any lab test that is abnormal or we need to change your treatment, we will call you to review the results.  Follow-Up: At [facility], you and your health needs are our priority.  As part of our continuing mission to provide you with exceptional heart care, we have created designated Provider Care Teams.  These Care Teams include your primary Cardiologist (physician) and Advanced Practice Providers (APPs -  Physician Assistants and Nurse Practitioners) who all work together to provide you with the care you need, when you need it.  Your next appointment:   6 month(s)  Provider:   Elouise Munroe, MD

## 2022-08-30 LAB — COMPREHENSIVE METABOLIC PANEL
ALT: 25 IU/L (ref 0–32)
AST: 29 IU/L (ref 0–40)
Albumin/Globulin Ratio: 1.8 (ref 1.2–2.2)
Albumin: 4.4 g/dL (ref 3.8–4.8)
Alkaline Phosphatase: 67 IU/L (ref 44–121)
BUN/Creatinine Ratio: 22 (ref 12–28)
BUN: 14 mg/dL (ref 8–27)
Bilirubin Total: 1.6 mg/dL — ABNORMAL HIGH (ref 0.0–1.2)
CO2: 24 mmol/L (ref 20–29)
Calcium: 10 mg/dL (ref 8.7–10.3)
Chloride: 105 mmol/L (ref 96–106)
Creatinine, Ser: 0.65 mg/dL (ref 0.57–1.00)
Globulin, Total: 2.5 g/dL (ref 1.5–4.5)
Glucose: 79 mg/dL (ref 70–99)
Potassium: 4.4 mmol/L (ref 3.5–5.2)
Sodium: 141 mmol/L (ref 134–144)
Total Protein: 6.9 g/dL (ref 6.0–8.5)
eGFR: 92 mL/min/{1.73_m2} (ref >59)

## 2022-08-30 LAB — CBC
Hematocrit: 39.8 % (ref 34.0–46.6)
Hemoglobin: 12.8 g/dL (ref 11.1–15.9)
MCH: 29.8 pg (ref 26.6–33.0)
MCHC: 32.2 g/dL (ref 31.5–35.7)
MCV: 93 fL (ref 79–97)
Platelets: 303 10*3/uL (ref 150–450)
RBC: 4.3 x10E6/uL (ref 3.77–5.28)
RDW: 11.4 % — ABNORMAL LOW (ref 11.7–15.4)
WBC: 3.9 10*3/uL (ref 3.4–10.8)

## 2022-08-30 LAB — LIPID PANEL
Chol/HDL Ratio: 2.1 ratio (ref 0.0–4.4)
Cholesterol, Total: 149 mg/dL (ref 100–199)
HDL: 72 mg/dL (ref >39)
LDL Chol Calc (NIH): 64 mg/dL (ref 0–99)
Triglycerides: 63 mg/dL (ref 0–149)
VLDL Cholesterol Cal: 13 mg/dL (ref 5–40)

## 2022-08-30 LAB — LIPOPROTEIN A (LPA): Lipoprotein (a): 198.6 nmol/L — ABNORMAL HIGH (ref <75.0)

## 2022-09-14 ENCOUNTER — Encounter: Payer: Self-pay | Admitting: Internal Medicine

## 2022-12-23 ENCOUNTER — Other Ambulatory Visit: Payer: Self-pay | Admitting: Internal Medicine

## 2022-12-27 ENCOUNTER — Telehealth: Payer: Self-pay | Admitting: Student

## 2022-12-27 MED ORDER — AMLODIPINE BESYLATE 5 MG PO TABS: 5.0000 mg | ORAL_TABLET | Freq: Every day | ORAL | 2 refills | Status: AC

## 2022-12-27 NOTE — Telephone Encounter (Signed)
Pt's medication was sent to pt's pharmacy as requested. Confirmation received.  °

## 2022-12-27 NOTE — Telephone Encounter (Signed)
*  STAT* If patient is at the pharmacy, call can be transferred to refill team.   1. Which medications need to be refilled? (please list name of each medication and dose if known) amLODipine (NORVASC) 5 MG tablet   2. Which pharmacy/location (including street and city if local pharmacy) is medication to be sent to?[facility] - [place], [place] - [address]   3. Do they need a 30 day or 90 day supply? 90 day   

## 2023-03-05 ENCOUNTER — Other Ambulatory Visit (HOSPITAL_BASED_OUTPATIENT_CLINIC_OR_DEPARTMENT_OTHER): Payer: Self-pay

## 2023-03-05 MED ORDER — COMIRNATY 30 MCG/0.3ML IM SUSY
PREFILLED_SYRINGE | Freq: Once | INTRAMUSCULAR | 0 refills | Status: AC
  Filled 2023-03-05: qty 0.3, 1d supply, fill #0

## 2023-03-13 ENCOUNTER — Encounter: Payer: Self-pay | Admitting: Internal Medicine

## 2023-03-13 ENCOUNTER — Ambulatory Visit: Payer: Medicare Other | Attending: Internal Medicine | Admitting: Internal Medicine

## 2023-03-13 VITALS — BP 132/78 | HR 63 | Ht 61.0 in | Wt 122.0 lb

## 2023-03-13 DIAGNOSIS — I10 Essential (primary) hypertension: Secondary | ICD-10-CM | POA: Diagnosis not present

## 2023-03-13 DIAGNOSIS — E785 Hyperlipidemia, unspecified: Secondary | ICD-10-CM

## 2023-03-13 DIAGNOSIS — I2510 Atherosclerotic heart disease of native coronary artery without angina pectoris: Secondary | ICD-10-CM | POA: Diagnosis not present

## 2023-03-13 NOTE — Progress Notes (Signed)
Cardiology Office Note:    Date:  03/13/2023   ID:  Kelly Harrington, DOB [birth year], MRN [id number]  PCP:  Kelly Harrington, Kelly Harrington.Kelly Saucer, MD  Cardiologist:  Kelly Poisson, MD  Electrophysiologist:  None   Referring MD: Kelly Harrington.Kelly Saucer, MD   Chief Complaint/Reason for Referral: Follow up CAD  History of Present Illness:    Kelly Harrington is a 77 y.o. female with a history of acute appendicitis with laparoscopic appendectomy 10/06/2019 with Dr. 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    Discussed the use of AI scribe software for clinical note transcription with the patient, who gave verbal consent to proceed.  History of Present Illness   The patient, with a history of coronary artery disease and elevated cholesterol, presents for follow up. 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 She has been trying to incorporate exercise into her routine, such as using a stationary bike, but finds it challenging due to her caregiving duties. She denies any chest discomfort or shortness of breath during exercise.  Additionally, the patient reports experiencing numbness in her left hand, affecting all fingers. The numbness is intermittent and seems to resolve with movement. She does not notice it more upon waking and it does not extend up into the arm. The patient has a history of carpal tunnel syndrome and has had surgery on the same hand in the past.  The patient also mentions a slight increase in her weight and acknowledges some recent lapses in her diet. She expresses a desire to get back on track with her eating habits.       Past Medical History:  Diagnosis Date   Coronary artery calcification    noted on CT scan in  10/2019   Hyperlipidemia    Hypertension    SBO (small bowel obstruction) (HCC)     Past Surgical History:  Procedure Laterality Date   APPENDECTOMY     LAPAROSCOPIC APPENDECTOMY N/A 10/06/2019   Procedure: APPENDECTOMY LAPAROSCOPIC;  Surgeon: Chevis Pretty III, MD;  Location: [facility];  Service: General;  Laterality: N/A;   ROTATOR CUFF REPAIR     TRIGGER FINGER RELEASE      Current Medications: Current Meds  Medication Sig   amLODipine (NORVASC) 5 MG tablet Take 1 tablet (5 mg total) by mouth daily.   aspirin 81 MG chewable tablet 1 tablet   atorvastatin (LIPITOR) 40 MG tablet TAKE 1 TABLET BY MOUTH DAILY   Calcium Carbonate (CALCIUM 500 PO) Take by mouth.   cetirizine (ZYRTEC) 10 MG tablet Take 10 mg by mouth daily as needed for allergies.   COVID-19 mRNA Vac-TriS, Pfizer, (COMIRNATY) SUSP injection Inject into the muscle.   ezetimibe (ZETIA) 10 MG tablet TAKE 1 TABLET BY MOUTH DAILY   polyethylene glycol (MIRALAX / GLYCOLAX) 17 g packet Take 17 g by mouth daily.     Allergies:   Codeine   Social History   Tobacco Use   Smoking status: Never   Smokeless tobacco: Never  Vaping Use   Vaping status: Never Used  Substance Use Topics   Alcohol use: No   Drug use: No     Family History: The patient's family history includes Heart failure in her father and mother; Hypertension in her father and mother.  ROS:   Please  see the history of present illness.     All other systems reviewed and are negative.  EKGs/Labs/Other Studies Reviewed:    The following studies were reviewed today:  Coronary CTA  09/08/2021: IMPRESSION: 1. Coronary calcium score of 367. This was 58 percentile for age and sex matched control.   2. Normal coronary origin with right dominance.   3. CAD-RADS 3. Moderate stenosis. Consider symptom-guided anti-ischemic pharmacotherapy as well as risk factor modification per guideline directed care. Additional analysis with CT FFR will be submitted.  CT  FFR ANALYSIS: FINDINGS: FFRct analysis was performed on the original cardiac CT angiogram dataset. Diagrammatic representation of the FFRct analysis is provided in a separate PDF document in PACS. This dictation was created using the PDF document and an interactive 3D model of the results. 3D model is not available in the EMR/PACS. Normal FFR range is >0.80. Indeterminate (grey) zone is 0.76-0.80.   1. Left Main: FFR = 0.95 2. LAD: Proximal FFR = 0.92, mid FFR = 0.84, distal FFR = 0.75 3. LCX: Proximal FFR = 0.93, distal FFR = 0.92 4. RCA: Proximal FFR = 0.95, mid FFR =0.93, distal FFR = 0.72   IMPRESSION: 1.  CT FFR analysis showed no significant stenosis.  Echo 10/28/2019:  1. Left ventricular ejection fraction, by estimation, is 70 to 75%. The  left ventricle has hyperdynamic function. The left ventricle has no  regional wall motion abnormalities. There is mild concentric left  ventricular hypertrophy. Left ventricular  diastolic parameters are consistent with Grade I diastolic dysfunction  (impaired relaxation).   2. Right ventricular systolic function is normal. The right ventricular  size is normal.   3. The mitral valve is normal in structure. No evidence of mitral valve  regurgitation. No evidence of mitral stenosis.   4. The aortic valve is normal in structure. Aortic valve regurgitation is  not visualized. No aortic stenosis is present.   5. The inferior vena cava is normal in size with greater than 50%  respiratory variability, suggesting right atrial pressure of 3 mmHg.  Results   LABS LDL: 64 (08/2022) Triglycerides: 63 (08/2022) HDL: 72 (08/2022) Lipoprotein A: 199 (08/2022)  DIAGNOSTIC Echocardiogram: Normal left ventricular ejection fraction, mild left ventricular hypertrophy, normal heart valves       EKG:     N/a 08/29/2022:  Sinus rhythm. PAC. Poor R wave progression.  02/26/2022: Sinus bradycardia, PAC, poor R wave progression. Rate 56. 12/02/2020: SR  with PAC's, rate 65 bpm 06/01/2020: SR with sinus arrhythmia, possible LAE  Recent Labs: 08/29/2022: ALT 25; BUN 14; Creatinine, Ser 0.65; Hemoglobin 12.8; Platelets 303; Potassium 4.4; Sodium 141   Recent Lipid Panel    Component Value Date/Time   CHOL 149 08/29/2022 0843   TRIG 63 08/29/2022 0843   HDL 72 08/29/2022 0843   CHOLHDL 2.1 08/29/2022 0843   CHOLHDL 5.1 10/29/2019 0307   VLDL 32 10/29/2019 0307   LDLCALC 64 08/29/2022 0843    Physical Exam:    VS:  BP 132/78   Pulse 63   Ht 5\' 1"  (1.549 m)   Wt 122 lb (55.3 kg)   SpO2 93%   BMI 23.05 kg/m     Wt Readings from Last 5 Encounters:  03/13/23 122 lb (55.3 kg)  08/29/22 116 lb 6.4 oz (52.8 kg)  02/26/22 124 lb 6.4 oz (56.4 kg)  10/23/21 128 lb 6.4 oz (58.2 kg)  08/28/21 129 lb 6.4 oz (58.7 kg)    Constitutional: No acute distress Eyes:  sclera non-icteric, normal conjunctiva and lids ENMT: normal dentition, moist mucous membranes Cardiovascular: regular rhythm, normal rate, no murmurs. S1 and S2 normal. Radial pulses normal bilaterally. No jugular venous distention.  Respiratory: clear to auscultation bilaterally GI : normal bowel sounds, soft and nontender. No distention.   MSK: extremities warm, well perfused. No edema.  NEURO: grossly nonfocal exam, moves all extremities. PSYCH: alert and oriented x 3, normal mood and affect.   Physical Exam   MEASUREMENTS: WT- 122lb CHEST: Lungs sound normal. CARDIOVASCULAR: Heart sounds normal. EXTREMITIES: No fluid or swelling in legs, 4/4 pulses in ankles and wrists.       ASSESSMENT:    1. Essential hypertension   2. Coronary artery disease involving native coronary artery of native heart without angina pectoris   3. Hyperlipidemia, unspecified hyperlipidemia type     PLAN:     Assessment and Plan  Hypertension  -Blood pressure well controlled on amlodipine 5 mg daily, continue.    HLD Coronary Artery Disease Stable, no chest discomfort, shortness of  breath, or palpitations. On Atorvastatin 40mg  daily and Zetia. Mild muscle discomfort with Atorvastatin but tolerable. -Continue Atorvastatin 40mg  daily and Zetia. -Check lipid panel and liver function today.  Numbness in Left Hand Numbness in all fingers, no extension into arm. History of carpal tunnel surgery and trigger finger release on the same hand. No associated cardiac symptoms. -Monitor symptoms. -Consider over-the-counter brace for potential carpal tunnel syndrome. -Discuss with new primary care physician at next visit.  Lifestyle and Exercise Difficulty maintaining exercise routine due to caregiving responsibilities for mother. -Encourage finding ways to incorporate exercise into daily routine, such as using resistance bands or light free weights at mother's house. -Encourage healthy eating choices.  Follow-up in 6 months with EKG at next visit unless symptoms develop.        Total time of encounter: 30 minutes total time of encounter, including 20 minutes spent in face-to-face patient care on the date of this encounter. This time includes coordination of care and counseling regarding above mentioned problem list. Remainder of non-face-to-face time involved reviewing chart documents/testing relevant to the patient encounter and documentation in the medical record. I have independently reviewed documentation from referring provider.   Weston Brass, MD, [facility]  [facility]   Medication Adjustments/Labs and Tests Ordered: Current medicines are reviewed at length with the patient today.  Concerns regarding medicines are outlined above.   Orders Placed This Encounter  Procedures   Lipid panel   Hepatic function panel   No orders of the defined types were placed in this encounter.  Patient Instructions  Medication Instructions:  Your physician recommends that you continue on your current medications as directed. Please refer to the Current Medication list  given to you today.  *If you need a refill on your cardiac medications before your next appointment, please call your pharmacy*   Lab Work: Your physician recommends that you have labs drawn today: Lipid/Liver panel  If you have labs (blood work) drawn today and your tests are completely normal, you will receive your results only by: MyChart Message (if you have MyChart) OR A paper copy in the mail If you have any lab test that is abnormal or we need to change your treatment, we will call you to review the results.   Follow-Up: At [facility], you and your health needs are our priority.  As part of our continuing mission to provide you with exceptional heart care, we have created  designated Provider Care Teams.  These Care Teams include your primary Cardiologist (physician) and Advanced Practice Providers (APPs -  Physician Assistants and Nurse Practitioners) who all work together to provide you with the care you need, when you need it.  We recommend signing up for the patient portal called "MyChart".  Sign up information is provided on this After Visit Summary.  MyChart is used to connect with patients for Virtual Visits (Telemedicine).  Patients are able to view lab/test results, encounter notes, upcoming appointments, etc.  Non-urgent messages can be sent to your provider as well.   To learn more about what you can do with MyChart, go to [email or web address].    Your next appointment:   6 month(s)  Provider:   Parke Poisson, MD

## 2023-03-13 NOTE — Patient Instructions (Signed)
Medication Instructions:  Your physician recommends that you continue on your current medications as directed. Please refer to the Current Medication list given to you today.  *If you need a refill on your cardiac medications before your next appointment, please call your pharmacy*   Lab Work: Your physician recommends that you have labs drawn today: Lipid/Liver panel  If you have labs (blood work) drawn today and your tests are completely normal, you will receive your results only by: MyChart Message (if you have MyChart) OR A paper copy in the mail If you have any lab test that is abnormal or we need to change your treatment, we will call you to review the results.   Follow-Up: At [facility], you and your health needs are our priority.  As part of our continuing mission to provide you with exceptional heart care, we have created designated Provider Care Teams.  These Care Teams include your primary Cardiologist (physician) and Advanced Practice Providers (APPs -  Physician Assistants and Nurse Practitioners) who all work together to provide you with the care you need, when you need it.  We recommend signing up for the patient portal called "MyChart".  Sign up information is provided on this After Visit Summary.  MyChart is used to connect with patients for Virtual Visits (Telemedicine).  Patients are able to view lab/test results, encounter notes, upcoming appointments, etc.  Non-urgent messages can be sent to your provider as well.   To learn more about what you can do with MyChart, go to [email or web address].    Your next appointment:   6 month(s)  Provider:   Parke Poisson, MD

## 2023-03-14 LAB — HEPATIC FUNCTION PANEL
ALT: 26 IU/L (ref 0–32)
AST: 35 IU/L (ref 0–40)
Albumin: 4.5 g/dL (ref 3.8–4.8)
Alkaline Phosphatase: 63 IU/L (ref 44–121)
Bilirubin Total: 1.3 mg/dL — ABNORMAL HIGH (ref 0.0–1.2)
Bilirubin, Direct: 0.3 mg/dL (ref 0.00–0.40)
Total Protein: 7.3 g/dL (ref 6.0–8.5)

## 2023-03-14 LAB — LIPID PANEL
Chol/HDL Ratio: 2.2 ratio (ref 0.0–4.4)
Cholesterol, Total: 173 mg/dL (ref 100–199)
HDL: 77 mg/dL (ref >39)
LDL Chol Calc (NIH): 80 mg/dL (ref 0–99)
Triglycerides: 87 mg/dL (ref 0–149)
VLDL Cholesterol Cal: 16 mg/dL (ref 5–40)

## 2023-03-28 ENCOUNTER — Other Ambulatory Visit: Payer: Self-pay | Admitting: Internal Medicine

## 2023-05-14 DIAGNOSIS — H04123 Dry eye syndrome of bilateral lacrimal glands: Secondary | ICD-10-CM | POA: Diagnosis not present

## 2023-05-14 DIAGNOSIS — H25813 Combined forms of age-related cataract, bilateral: Secondary | ICD-10-CM | POA: Diagnosis not present

## 2023-05-14 DIAGNOSIS — H43392 Other vitreous opacities, left eye: Secondary | ICD-10-CM | POA: Diagnosis not present

## 2023-05-14 DIAGNOSIS — H43811 Vitreous degeneration, right eye: Secondary | ICD-10-CM | POA: Diagnosis not present

## 2023-05-30 DIAGNOSIS — Z1231 Encounter for screening mammogram for malignant neoplasm of breast: Secondary | ICD-10-CM | POA: Diagnosis not present

## 2023-06-10 DIAGNOSIS — R922 Inconclusive mammogram: Secondary | ICD-10-CM | POA: Diagnosis not present

## 2023-06-13 ENCOUNTER — Other Ambulatory Visit: Payer: Self-pay | Admitting: Internal Medicine

## 2023-06-13 DIAGNOSIS — I700 Atherosclerosis of aorta: Secondary | ICD-10-CM | POA: Diagnosis not present

## 2023-06-13 DIAGNOSIS — L509 Urticaria, unspecified: Secondary | ICD-10-CM | POA: Diagnosis not present

## 2023-06-13 DIAGNOSIS — E7800 Pure hypercholesterolemia, unspecified: Secondary | ICD-10-CM | POA: Diagnosis not present

## 2023-06-13 DIAGNOSIS — H6122 Impacted cerumen, left ear: Secondary | ICD-10-CM | POA: Diagnosis not present

## 2023-06-13 DIAGNOSIS — I2510 Atherosclerotic heart disease of native coronary artery without angina pectoris: Secondary | ICD-10-CM | POA: Diagnosis not present

## 2023-06-13 DIAGNOSIS — R6889 Other general symptoms and signs: Secondary | ICD-10-CM | POA: Diagnosis not present

## 2023-06-13 DIAGNOSIS — R008 Other abnormalities of heart beat: Secondary | ICD-10-CM | POA: Diagnosis not present

## 2023-06-13 DIAGNOSIS — M8588 Other specified disorders of bone density and structure, other site: Secondary | ICD-10-CM | POA: Diagnosis not present

## 2023-06-13 DIAGNOSIS — I10 Essential (primary) hypertension: Secondary | ICD-10-CM | POA: Diagnosis not present

## 2023-06-13 DIAGNOSIS — Z0000 Encounter for general adult medical examination without abnormal findings: Secondary | ICD-10-CM | POA: Diagnosis not present

## 2023-06-13 DIAGNOSIS — D369 Benign neoplasm, unspecified site: Secondary | ICD-10-CM | POA: Diagnosis not present

## 2023-06-15 ENCOUNTER — Other Ambulatory Visit: Payer: Self-pay | Admitting: Internal Medicine

## 2023-06-18 ENCOUNTER — Ambulatory Visit
Admission: RE | Admit: 2023-06-18 | Discharge: 2023-06-18 | Disposition: A | Discharge status: Home / Self Care | Payer: Medicare Other | Source: Ambulatory Visit | Attending: Internal Medicine

## 2023-06-18 DIAGNOSIS — M79669 Pain in unspecified lower leg: Secondary | ICD-10-CM | POA: Diagnosis not present

## 2023-06-18 DIAGNOSIS — R6889 Other general symptoms and signs: Secondary | ICD-10-CM

## 2023-07-01 DIAGNOSIS — M25551 Pain in right hip: Secondary | ICD-10-CM | POA: Diagnosis not present

## 2023-07-04 DIAGNOSIS — H268 Other specified cataract: Secondary | ICD-10-CM | POA: Diagnosis not present

## 2023-07-04 DIAGNOSIS — H25811 Combined forms of age-related cataract, right eye: Secondary | ICD-10-CM | POA: Diagnosis not present

## 2023-07-04 DIAGNOSIS — H2511 Age-related nuclear cataract, right eye: Secondary | ICD-10-CM | POA: Diagnosis not present

## 2023-07-05 ENCOUNTER — Telehealth: Payer: Self-pay | Admitting: Internal Medicine

## 2023-07-05 MED ORDER — EZETIMIBE 10 MG PO TABS: 10.0000 mg | ORAL_TABLET | Freq: Every day | ORAL | 2 refills | Status: AC

## 2023-07-05 NOTE — Telephone Encounter (Signed)
Pt's medication was sent to pt's pharmacy as requested. Confirmation received.  °

## 2023-07-05 NOTE — Telephone Encounter (Signed)
*  STAT* If patient is at the pharmacy, call can be transferred to refill team.   1. Which medications need to be refilled? (please list name of each medication and dose if known)   ezetimibe (ZETIA) 10 MG tablet   2. Which pharmacy/location (including street and city if local pharmacy) is medication to be sent to? [facility] - [place], [place] - [address] Phone: [phone]  Fax: [phone]     3. Do they need a 30 day or 90 day supply? 90 Pt is out of medication

## 2023-07-18 DIAGNOSIS — H25812 Combined forms of age-related cataract, left eye: Secondary | ICD-10-CM | POA: Diagnosis not present

## 2023-07-18 DIAGNOSIS — H268 Other specified cataract: Secondary | ICD-10-CM | POA: Diagnosis not present

## 2023-07-18 DIAGNOSIS — H2512 Age-related nuclear cataract, left eye: Secondary | ICD-10-CM | POA: Diagnosis not present

## 2023-08-27 ENCOUNTER — Other Ambulatory Visit (HOSPITAL_BASED_OUTPATIENT_CLINIC_OR_DEPARTMENT_OTHER): Payer: Self-pay

## 2023-08-27 MED ORDER — COMIRNATY 30 MCG/0.3ML IM SUSY
0.3000 mL | PREFILLED_SYRINGE | Freq: Once | INTRAMUSCULAR | 0 refills | Status: AC
  Filled 2023-08-27: qty 0.3, 1d supply, fill #0

## 2023-09-06 DIAGNOSIS — M25551 Pain in right hip: Secondary | ICD-10-CM | POA: Diagnosis not present

## 2023-09-10 DIAGNOSIS — M25551 Pain in right hip: Secondary | ICD-10-CM | POA: Diagnosis not present

## 2023-09-12 ENCOUNTER — Encounter: Payer: Self-pay | Admitting: Internal Medicine

## 2023-09-12 ENCOUNTER — Telehealth: Payer: Self-pay | Admitting: Internal Medicine

## 2023-09-12 ENCOUNTER — Ambulatory Visit: Payer: Medicare Other | Attending: Internal Medicine | Admitting: Internal Medicine

## 2023-09-12 VITALS — BP 130/70 | HR 60 | Ht 61.0 in | Wt 126.8 lb

## 2023-09-12 DIAGNOSIS — M25551 Pain in right hip: Secondary | ICD-10-CM | POA: Diagnosis not present

## 2023-09-12 DIAGNOSIS — M791 Myalgia, unspecified site: Secondary | ICD-10-CM | POA: Diagnosis not present

## 2023-09-12 DIAGNOSIS — I10 Essential (primary) hypertension: Secondary | ICD-10-CM | POA: Diagnosis not present

## 2023-09-12 DIAGNOSIS — E785 Hyperlipidemia, unspecified: Secondary | ICD-10-CM | POA: Diagnosis not present

## 2023-09-12 DIAGNOSIS — I2510 Atherosclerotic heart disease of native coronary artery without angina pectoris: Secondary | ICD-10-CM | POA: Diagnosis not present

## 2023-09-12 DIAGNOSIS — Z7189 Other specified counseling: Secondary | ICD-10-CM | POA: Diagnosis not present

## 2023-09-12 DIAGNOSIS — I491 Atrial premature depolarization: Secondary | ICD-10-CM

## 2023-09-12 NOTE — Telephone Encounter (Signed)
 Called and spoke to pt regarding the need to continue PT.  She states her PCP suggested she go to PT two times per week for the next month to help with the Hip/Leg pains.  Advised pt to call her PCP since they were the ones that recommenced/ordered it to begin with.  It may be that they suggest to hold off for now. At [facility] today, Statin and Zetia were placed on hold for one month (pt c/o myalgias).  Pt to get in touch with [place] on 10/17/2023.

## 2023-09-12 NOTE — Telephone Encounter (Signed)
 Pt c/o medication issue:  1. Name of Medication:   atorvastatin (LIPITOR) 40 MG tablet    ezetimibe (ZETIA) 10 MG tablet    2. How are you currently taking this medication (dosage and times per day)?  TAKE 1 TABLET BY MOUTH DAILY     Take 1 tablet (10 mg total) by mouth daily.     3. Are you having a reaction (difficulty breathing--STAT)? No  4. What is your medication issue? Pt stated at her office visit today she was advised to stop taking these 2 medications for 4 weeks to see if that helps with the pain her joints she's been having but now she'd like to know if she should also stop her physical therapy for now while being off of the medications. Please advise

## 2023-09-12 NOTE — Patient Instructions (Signed)
 Medication Instructions:  Hold/Stop:  Atorvastatin (Lipitor) and Ezetimibe (Zetia) for ONE Month  On 10/17/2023, please call us or send a MyChart message to notify us if the leg discomfort continues. [phone].   Lab Work: None  Follow-Up: At [facility], you and your health needs are our priority.  As part of our continuing mission to provide you with exceptional heart care, we have created designated Provider Care Teams.  These Care Teams include your primary Cardiologist (physician) and Advanced Practice Providers (APPs -  Physician Assistants and Nurse Practitioners) who all work together to provide you with the care you need, when you need it.   Your next appointment:   6 month(s)  Provider:   Parke Poisson, MD    Other Instructions On 10/17/2023, please call us or send a MyChart message to notify us if the leg discomfort continues. [phone].

## 2023-09-12 NOTE — Progress Notes (Signed)
 Cardiology Office Note:  .   Date:  09/12/2023  ID:  Kelly Harrington, DOB 12-28-1945, MRN [id number] PCP: Asencion Gowda.August Saucer, MD (Inactive)  [facility] Providers Cardiologist:  Parke Poisson, MD    History of Present Illness: .   Kelly Harrington is a 78 y.o. female.  Discussed the use of AI scribe software for clinical note transcription with the patient, who gave verbal consent to proceed.  History of Present Illness   The patient, with a history of coronary artery disease and on statin and Zetia for cholesterol management, presents with persistent leg pain. The pain, described as aching, started in the hip area and has since spread down to the legs. The discomfort is severe enough to affect daily activities, such as lifting the leg to get into the bathtub due to pain. The patient has been taking ibuprofen for pain management, which only eases the discomfort but does not eliminate it. The patient denies any recent falls or injuries that could have triggered the pain. The pain started on one side but has now affected both sides. The patient has started physical therapy sessions, but there has been no significant improvement in the pain level. The patient also mentions that the pain throbs at night, making it difficult to sleep.        ROS: negative except per HPI above.  Studies Reviewed: Marland Kitchen   EKG Interpretation Date/Time:  Thursday September 12 2023 08:34:11 EDT Ventricular Rate:  60 PR Interval:  158 QRS Duration:  80 QT Interval:  430 QTC Calculation: 430 R Axis:   11  Text Interpretation: Sinus rhythm with Premature supraventricular complexes Confirmed by Weston Brass ([id number]) on 09/12/2023 9:03:27 AM    Results   LABS LDL: 101 (05/2023) Triglycerides: 104 (05/2023) HDL: 78 (05/2023)  RADIOLOGY Coronary CT: Moderate blockage, no severe stenosis  DIAGNOSTIC EKG: Sinus rhythm with premature atrial contractions (PACs) (09/12/2023)     Risk  Assessment/Calculations:        Physical Exam:   VS:  BP 130/70 (BP Location: Left Arm, Patient Position: Sitting, Cuff Size: Normal)   Pulse 60   Ht 5\' 1"  (1.549 m)   Wt 126 lb 12.8 oz (57.5 kg)   SpO2 99%   BMI 23.96 kg/m    Wt Readings from Last 3 Encounters:  09/12/23 126 lb 12.8 oz (57.5 kg)  03/13/23 122 lb (55.3 kg)  08/29/22 116 lb 6.4 oz (52.8 kg)     Physical Exam   GENERAL: Alert, cooperative, well developed, no acute distress HEENT: Normocephalic, normal oropharynx, moist mucous membranes CHEST: Clear to auscultation bilaterally, no wheezes, rhonchi, or crackles CARDIOVASCULAR: Sinus rhythm with premature atrial contractions, S1 and S2 normal without murmurs ABDOMEN: Soft, non-tender, non-distended, without organomegaly, normal bowel sounds EXTREMITIES: No cyanosis or edema NEUROLOGICAL: Cranial nerves grossly intact, moves all extremities without gross motor or sensory deficit      ASSESSMENT AND PLAN: .     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 Minimal relief from physical therapy and ibuprofen. Discussed temporary cessation of atorvastatin and ezetimibe to assess contribution to pain. Explained potential for alternative therapies if pain resolves. - Hold atorvastatin and ezetimibe for four weeks. - Consider reintroducing medications if pain persists. - Evaluate alternative cholesterol-lowering therapies if pain resolves. - Continue physical therapy. - Report pain levels on May 1st.  Hyperlipidemia LDL above target; HDL protective. Discussed  dietary changes during medication hold. PCKS9I medications as alternatives if statin and ezetimibe are discontinued. - Focus on dietary changes during medication hold. - Consider alternative therapies if statin and ezetimibe are discontinued.  Coronary Artery Disease Coronary artery calcium and plaque without  significant stenosis. Educated on distinction between coronary artery disease and MI. - Continue managing cholesterol and blood pressure.  Hypertension Blood pressure well-controlled with amlodipine. Goal to maintain below 130/70 mmHg. - Continue amlodipine 5 mg daily. - Monitor blood pressure and report if exceeds 130/70 mmHg.  Premature Atrial Contractions (PACs) Intermittent PACs likely due to dehydration or caffeine. Not concerning but requires monitoring. Advised hydration. - Ensure adequate hydration. - Monitor for changes in symptoms or frequency.  Follow-up Plan to assess impact of medication hold on leg pain and overall health management. - Send MyChart message on May 1st to report leg pain status. - Schedule follow-up in six months. - Coordinate with primary care for lab work in June.

## 2023-09-13 NOTE — Telephone Encounter (Signed)
 Called and spoke to pt.  Dr. Lupe Carney recommendations given, to continue PT.  Pt will call us 10/17/23 to update [place] on myalgias.

## 2023-09-24 DIAGNOSIS — M25551 Pain in right hip: Secondary | ICD-10-CM | POA: Diagnosis not present

## 2023-10-01 DIAGNOSIS — M25551 Pain in right hip: Secondary | ICD-10-CM | POA: Diagnosis not present

## 2023-10-03 DIAGNOSIS — M25551 Pain in right hip: Secondary | ICD-10-CM | POA: Diagnosis not present

## 2023-10-08 DIAGNOSIS — M25551 Pain in right hip: Secondary | ICD-10-CM | POA: Diagnosis not present

## 2023-10-15 DIAGNOSIS — M25551 Pain in right hip: Secondary | ICD-10-CM | POA: Diagnosis not present

## 2023-10-17 ENCOUNTER — Telehealth: Payer: Self-pay | Admitting: Internal Medicine

## 2023-10-17 DIAGNOSIS — I2510 Atherosclerotic heart disease of native coronary artery without angina pectoris: Secondary | ICD-10-CM

## 2023-10-17 DIAGNOSIS — E785 Hyperlipidemia, unspecified: Secondary | ICD-10-CM

## 2023-10-17 DIAGNOSIS — M25551 Pain in right hip: Secondary | ICD-10-CM | POA: Diagnosis not present

## 2023-10-17 NOTE — Telephone Encounter (Signed)
 Patient returned RN's call.

## 2023-10-17 NOTE — Telephone Encounter (Signed)
 Left message to return call to office.

## 2023-10-17 NOTE — Telephone Encounter (Signed)
 Pt c/o medication issue:  1. Name of Medication:   All cholesterol medications  2. How are you currently taking this medication (dosage and times per day)?   Not taking  3. Are you having a reaction (difficulty breathing--STAT)?   4. What is your medication issue?   Patient called to report she had stopped taking all of her cholesterol medication for one month as directed and since she stopped the medications she feels much better and the pain in her leg has stopped and she felt better within the first week of stopping the medication. Patient stated she is doing physical therapy and her legs are getting back to normal.

## 2023-10-17 NOTE — Telephone Encounter (Signed)
 Pt reports resolution of symptoms since stopping her cholesterol medications (both Lipitor and Zetia ), she feels much improved.  Aware forwarding to MD for advisement and that office will let her know recommendation.  Pt agreeable to plan.

## 2023-10-18 NOTE — Telephone Encounter (Signed)
 Called and spoke to pt regarding the discontinuation of Atorvastatin  and Ezetimibe . Pt states her leg pain during the night stopped one week after d/c these meds.  Her Hips and Joints continue to ache, but Physical Therapy is helping with this. She does not want to start any new medications right now. She states she has been on four different meds and these have caused the worst side effects/myalgias. She has a PCP office visit on 12/18/23 and will probably have labs drawn at that time. She agrees to see PharmD (Dr. Miller Allis recommendation); pt is established and last saw [place], Kristin on 10/23/2021.   She is asking about OTC meds/recommendations; a friend told her about Cholestoff.

## 2023-10-22 DIAGNOSIS — M25551 Pain in right hip: Secondary | ICD-10-CM | POA: Diagnosis not present

## 2023-10-24 DIAGNOSIS — M25551 Pain in right hip: Secondary | ICD-10-CM | POA: Diagnosis not present

## 2023-10-24 NOTE — Telephone Encounter (Signed)
 Pt calling to get update, no referral placed for pharmD, requesting cb

## 2023-10-24 NOTE — Telephone Encounter (Signed)
 Patient identification verified by 2 forms. Spoke with pt regarding seeing a pharmd for statin intolerance. Pt is calling back because she has yet to be scheduled. RN can not see an active referral for PharmD. Referral placed and appointment made for pt. All questions ordered and pt verbalizes understanding.

## 2023-10-25 DIAGNOSIS — M25512 Pain in left shoulder: Secondary | ICD-10-CM | POA: Diagnosis not present

## 2023-12-16 ENCOUNTER — Encounter: Payer: Self-pay | Admitting: Pharmacist

## 2023-12-16 ENCOUNTER — Ambulatory Visit: Attending: Cardiology | Admitting: Pharmacist

## 2023-12-16 ENCOUNTER — Telehealth: Payer: Self-pay

## 2023-12-16 ENCOUNTER — Telehealth: Payer: Self-pay | Admitting: Pharmacist

## 2023-12-16 ENCOUNTER — Other Ambulatory Visit (HOSPITAL_COMMUNITY): Payer: Self-pay

## 2023-12-16 DIAGNOSIS — E7849 Other hyperlipidemia: Secondary | ICD-10-CM

## 2023-12-16 NOTE — Patient Instructions (Signed)
 Your Results:             Your most recent labs Goal  Total Cholesterol 197 < 200  Triglycerides 104 < 150  HDL (happy/good cholesterol) 78 > 40  LDL (lousy/bad cholesterol 101 < 70   Medication changes: Restart atorvastatin  20 mg 3 times per week . We will start the process to get PCSK9i (Repatha or Praluent)  covered by your insurance.  Once the prior authorization is complete, we will call you to let you know and confirm pharmacy information.     Praluent is a cholesterol medication that improved your body's ability to get rid of bad cholesterol known as LDL. It can lower your LDL up to 60%. It is an injection that is given under the skin every 2 weeks. The most common side effects of Praluent include runny nose, symptoms of the common cold, rarely flu or flu-like symptoms, back/muscle pain in about 3-4% of the patients, and redness, pain, or bruising at the injection site.    Repatha is a cholesterol medication that improved your body's ability to get rid of bad cholesterol known as LDL. It can lower your LDL up to 60%! It is an injection that is given under the skin every 2 weeks. The medication often requires a prior authorization from your insurance company. We will take care of submitting all the necessary information to your insurance company to get it approved. The most common side effects of Repatha include runny nose, symptoms of the common cold, rarely flu or flu-like symptoms, back/muscle pain in about 3-4% of the patients, and redness, pain, or bruising at the injection site.   Lab orders: We want to repeat labs after 2-3 months.  We will send you a lab order to remind you once we get closer to that time.

## 2023-12-16 NOTE — Telephone Encounter (Signed)
 Pharmacy Patient Advocate Encounter   Received notification from Physician's Office that prior authorization for REPATHA is required/requested.   Insurance verification completed.   The patient is insured through [facility] .   Per test claim: PA required; However, NEW/RECENT labs/notes are needed to complete & submit PA request. Please see below.   NEED LDL C WITHIN LAST 120 DAYS

## 2023-12-16 NOTE — Assessment & Plan Note (Signed)
 Assessment:  LDL goal: <70  mg/dl last LDLc 898 mg/dl (Dec 7974)  Max tolerated statin - atorvastatin  20 mg 3 times per week   Intolerance to - atorvastatin  20 mg daily, 40 mg daily,Crestor 10 mg daily, Zetia  10 mg daily due to myalgia, pravastatin -insufficient response  Discussed next potential options (PCSK-9 inhibitors, bempedoic acid and inclisiran); cost, dosing efficacy, side effects  Follows plant based diet and exercise regularly   Plan: Will be going for Lipid lab on July 2nd - off of lipid lowering therapy from past 3 months  Willing to go back on max tolerated statin - atorvastatin  20 mg 3 times per week  Need updated lab result for PCSK9i PA will initiate PA once we have lab result on July 3

## 2023-12-16 NOTE — Telephone Encounter (Signed)
 Sounds good, thanks.

## 2023-12-16 NOTE — Telephone Encounter (Signed)
 PA request has been Started. New Encounter has been or will be created for follow up. For additional info see Pharmacy Prior Auth telephone encounter from 12/16/23.

## 2023-12-16 NOTE — Progress Notes (Signed)
 Patient ID: Kelly Harrington                 DOB: 01/15/1946                    MRN: [id number]      HPI: Kelly Harrington is a 78 y.o. female patient referred to lipid clinic by Dr.Acharya. PMH is significant for CAD, HLD, HTN.  At the last visit with Dr.Acharya pt had complaint about severe pain that was affecting daily activity. Pt was advised to hold Zetia  and statin for four weeks.  In May 2025 Pt stated her leg pain during the night stopped one week after d/c these meds. Her Hips and Joints continue to ache, but Physical Therapy is helping with this.  Patient presented today for lipid management. She reports intolerance to atorvastatin  at both 40 mg and 20 mg daily doses due to muscle and joint pain. She also could not tolerate statins in combination with Zetia . Previously, she tolerated atorvastatin  20 mg taken three times per week better; however, this regimen was insufficient to achieve LDL cholesterol goals. She had tried Crestor in the past but discontinued it due to myalgia, and pravastatin was stopped due to inadequate LDL lowering.  For the past three months, the patient has been off all lipid-lowering medications and following a plant-based diet, avoiding milk and dairy products. A lipid panel is scheduled through her primary care provider on December 18, 2023.  We reviewed options to lower LDL cholesterol, including PCSK9 inhibitors, bempedoic acid, and inclisiran. The mechanisms of action, dosing schedules, potential side effects, and expected LDL reductions were discussed. Cost considerations and patient assistance programs were also reviewed to aid in decision-making.  Current Medications: none  Intolerances: atorvastatin  40 mg, Zetia  10 mg, Pravastatin - insufficient response, rosuvastatin daily caused some muscle issues  Risk Factors: CAD, HTN LDL goal:<70  Last lab from Dec 2024 LDL 101, TC 197, HDL, 78, TG 104   Diet: plant based diet from past 3 moths  Social  history: Alcohol: wine -occasionally  Smoking: none  Exercise: stationary bike 30 min 3 times per week plus  go to gym 2-3 times per week does cardio for 45 min -60 min   Family History:  Relation Problem Comments  Mother (Alive) Heart failure   Hypertension     Father (Deceased) Heart failure   Hypertension        Labs:  Lipid Panel     Component Value Date/Time   CHOL 173 03/13/2023 0934   TRIG 87 03/13/2023 0934   HDL 77 03/13/2023 0934   CHOLHDL 2.2 03/13/2023 0934   CHOLHDL 5.1 10/29/2019 0307   VLDL 32 10/29/2019 0307   LDLCALC 80 03/13/2023 0934   LABVLDL 16 03/13/2023 0934    Past Medical History:  Diagnosis Date   Coronary artery calcification    noted on CT scan in 10/2019   Hyperlipidemia    Hypertension    SBO (small bowel obstruction) (HCC)     Current Outpatient Medications on File Prior to Visit  Medication Sig Dispense Refill   atorvastatin  (LIPITOR) 20 MG tablet Take 1 tab three times per week     amLODipine  (NORVASC ) 5 MG tablet Take 1 tablet (5 mg total) by mouth daily. 100 tablet 2   aspirin  81 MG chewable tablet 1 tablet     Calcium  Carbonate (CALCIUM  500 PO) Take by mouth.     cetirizine (ZYRTEC) 10 MG tablet Take 10  mg by mouth daily as needed for allergies.     COVID-19 mRNA Vac-TriS, Pfizer, (COMIRNATY ) SUSP injection Inject into the muscle. 0.3 mL 0   ezetimibe  (ZETIA ) 10 MG tablet Take 1 tablet (10 mg total) by mouth daily. 100 tablet 2   polyethylene glycol (MIRALAX  / GLYCOLAX ) 17 g packet Take 17 g by mouth daily.     No current facility-administered medications on file prior to visit.    Allergies  Allergen Reactions   Codeine Nausea And Vomiting    Assessment/Plan:  1. Hyperlipidemia -  Problem  Hyperlipidemia   Current Medications: Lipitor 20 mg 3 times per week Intolerances: atorvastatin  40 mg, Zetia  10 mg, Pravastatin - insufficient response, rosuvastatin daily caused some muscle issues  Risk Factors: CAD, HTN LDL  goal:<70  Last lab from Dec 2024 LDL 101, TC 197, HDL, 78, TG 104     Hyperlipidemia Assessment:  LDL goal: <70  mg/dl last LDLc 898 mg/dl (Dec 7974)  Max tolerated statin - atorvastatin  20 mg 3 times per week   Intolerance to - atorvastatin  20 mg daily, 40 mg daily,Crestor 10 mg daily, Zetia  10 mg daily due to myalgia, pravastatin -insufficient response  Discussed next potential options (PCSK-9 inhibitors, bempedoic acid and inclisiran); cost, dosing efficacy, side effects  Follows plant based diet and exercise regularly   Plan: Will be going for Lipid lab on July 2nd - off of lipid lowering therapy from past 3 months  Willing to go back on max tolerated statin - atorvastatin  20 mg 3 times per week  Need updated lab result for PCSK9i PA will initiate PA once we have lab result on July 3     Thank you,  Robbi Blanch, Pharm.D [place] [facility]. [facility] [address], [place], [place] [address] Phone: [phone]; Fax: [phone]

## 2023-12-18 DIAGNOSIS — L509 Urticaria, unspecified: Secondary | ICD-10-CM | POA: Diagnosis not present

## 2023-12-18 DIAGNOSIS — I10 Essential (primary) hypertension: Secondary | ICD-10-CM | POA: Diagnosis not present

## 2023-12-18 DIAGNOSIS — E7800 Pure hypercholesterolemia, unspecified: Secondary | ICD-10-CM | POA: Diagnosis not present

## 2023-12-18 DIAGNOSIS — I700 Atherosclerosis of aorta: Secondary | ICD-10-CM | POA: Diagnosis not present

## 2023-12-18 DIAGNOSIS — I2510 Atherosclerotic heart disease of native coronary artery without angina pectoris: Secondary | ICD-10-CM | POA: Diagnosis not present

## 2023-12-18 DIAGNOSIS — M25551 Pain in right hip: Secondary | ICD-10-CM | POA: Diagnosis not present

## 2024-02-18 DIAGNOSIS — H43392 Other vitreous opacities, left eye: Secondary | ICD-10-CM | POA: Diagnosis not present

## 2024-02-18 DIAGNOSIS — H04123 Dry eye syndrome of bilateral lacrimal glands: Secondary | ICD-10-CM | POA: Diagnosis not present

## 2024-02-18 DIAGNOSIS — H43811 Vitreous degeneration, right eye: Secondary | ICD-10-CM | POA: Diagnosis not present

## 2024-03-05 ENCOUNTER — Other Ambulatory Visit (HOSPITAL_BASED_OUTPATIENT_CLINIC_OR_DEPARTMENT_OTHER): Payer: Self-pay

## 2024-03-05 ENCOUNTER — Other Ambulatory Visit (HOSPITAL_COMMUNITY): Payer: Self-pay

## 2024-03-05 MED ORDER — COMIRNATY 30 MCG/0.3ML IM SUSY
0.3000 mL | PREFILLED_SYRINGE | Freq: Once | INTRAMUSCULAR | 0 refills | Status: AC
  Filled 2024-03-05: qty 0.3, 1d supply, fill #0

## 2024-03-13 ENCOUNTER — Ambulatory Visit: Admitting: Internal Medicine

## 2024-04-28 ENCOUNTER — Encounter: Payer: Self-pay | Admitting: *Deleted

## 2024-04-30 ENCOUNTER — Telehealth: Payer: Self-pay | Admitting: Pharmacist

## 2024-04-30 ENCOUNTER — Ambulatory Visit: Attending: Student in an Organized Health Care Education/Training Program | Admitting: Internal Medicine

## 2024-04-30 VITALS — BP 140/72 | HR 58 | Ht 61.0 in | Wt 114.0 lb

## 2024-04-30 DIAGNOSIS — I2510 Atherosclerotic heart disease of native coronary artery without angina pectoris: Secondary | ICD-10-CM

## 2024-04-30 DIAGNOSIS — E7849 Other hyperlipidemia: Secondary | ICD-10-CM

## 2024-04-30 NOTE — Patient Instructions (Signed)
 Medication Instructions:  No Changes  Lab Work: None  Follow-Up: At [facility], you and your health needs are our priority.  As part of our continuing mission to provide you with exceptional heart care, our providers are all part of one team.  This team includes your primary Cardiologist (physician) and Advanced Practice Providers or APPs (Physician Assistants and Nurse Practitioners) who all work together to provide you with the care you need, when you need it.  Your next appointment:   6 month(s)  Provider:   Gayatri A Acharya, MD    Other Instructions Please call us  or send a MyChart message with any Cardiology related questions/concerns.  [phone].  Thank you!

## 2024-04-30 NOTE — Progress Notes (Signed)
 Cardiology Office Note:  .   Date:  04/30/2024  ID:  Kelly Harrington, DOB Jun 17, 1946, MRN [id number] PCP: Vernon Velna SAUNDERS, MD   HeartCare Providers Cardiologist:  Soyla DELENA Merck, MD    History of Present Illness: .   Kelly Harrington is a 78 y.o. female.  Discussed the use of AI scribe software for clinical note transcription with the patient, who gave verbal consent to proceed.  History of Present Illness Kelly Harrington is a 78 year old female with nonobstructive coronary artery disease and hypertension who presents for follow-up of lightheadedness and cholesterol management.  She experienced one to two episodes of lightheadedness approximately two months ago, which were sudden in onset and resolved after sitting down. No further episodes have occurred. She denies chest pain or shortness of breath.  She manages her hypertension with amlodipine  5 mg daily. Her blood pressure is usually in the 120s at home, though it was 140/72 mmHg today. She monitors her sodium intake and consumes minimal high-sodium foods.  Her LDL level has increased to 166 mg/dL from a previous level of 80 mg/dL. She has muscle aches associated with atorvastatin  and Zetia , which were held due to possible myalgias. Attempts to restart atorvastatin  three times a week in July resulted in joint and muscle aches, particularly in her legs. She follows a mostly plant-based diet, eliminating red meat, chicken, dairy, and eggs, and has reintroduced fish. She is exploring plant-based protein sources and considering whey protein supplementation.  She has a history of intermittent premature atrial contractions noted on a previous EKG and underwent a coronary CT scan in March 2023, showing moderate blockages.    ROS: negative except per HPI above.  Studies Reviewed: SABRA   EKG Interpretation Date/Time:  Thursday April 30 2024 08:08:33 EST Ventricular Rate:  58 PR Interval:  152 QRS  Duration:  88 QT Interval:  458 QTC Calculation: 449 R Axis:   2  Text Interpretation: Sinus bradycardia Cannot rule out Anterior infarct , age undetermined When compared with ECG of 12-Sep-2023 08:34, Premature supraventricular complexes are no longer Present Minimal criteria for Anterior infarct are now present, however have been seen previously on EKGs, likely poor R wave progression Confirmed by Merck Soyla ([id number]) on 04/30/2024 8:10:24 AM    Results LABS LDL: 166 (12/2023) LDL: 80 (2024)  RADIOLOGY Coronary CT: Moderate stenosis in the left anterior descending artery (08/2021)  DIAGNOSTIC EKG: Intermittent premature atrial contractions (PACs) Risk Assessment/Calculations:       Physical Exam:   VS:  BP (!) 140/72 (BP Location: Left Arm, Patient Position: Sitting, Cuff Size: Normal)   Pulse (!) 58   Ht 5' 1 (1.549 m)   Wt 114 lb (51.7 kg)   SpO2 98%   BMI 21.54 kg/m    Wt Readings from Last 3 Encounters:  04/30/24 114 lb (51.7 kg)  09/12/23 126 lb 12.8 oz (57.5 kg)  03/13/23 122 lb (55.3 kg)     Physical Exam VITALS: BP- 140/72 GENERAL: Alert, cooperative, well developed, no acute distress. HEENT: Normocephalic, normal oropharynx, moist mucous membranes. CHEST: Clear to auscultation bilaterally, no wheezes, rhonchi, or crackles. CARDIOVASCULAR: Normal heart rate and rhythm, S1 and S2 normal without murmurs. ABDOMEN: Soft, non-tender, non-distended, without organomegaly, normal bowel sounds. EXTREMITIES: No cyanosis or edema. NEUROLOGICAL: Cranial nerves grossly intact, moves all extremities without gross motor or sensory deficit.   ASSESSMENT AND PLAN: .    Assessment and Plan Assessment & Plan Nonobstructive coronary artery disease with  hyperlipidemia Moderate coronary artery blockages with elevated LDL at 166 mg/dL. Statin-induced myalgias limit atorvastatin  use. Discussed injectable cholesterol medications and Nexlizet as alternatives. Emphasized plaque  stabilization - Sent message to pharmacist to discuss alternative oral options for cholesterol management. - Continue current diet modifications. - continue atorva 20 mg 2-3 x weekly for now.  - Recheck cholesterol levels in January.  Hypertension Blood pressure 140/72 mmHg in office, lower at home. Possible white coat hypertension. No immediate medication adjustment needed. - Continue amlodipine  5 mg daily. - Monitor blood pressure at home and maintain a log. - Bring blood pressure log to next appointment for review.  Statin-induced myalgias Muscle aches and joint pain with atorvastatin , improving upon discontinuation. Symptoms limit atorvastatin  use to three times a week. Discussed alternative medications and dietary modifications. - Discontinued atorvastatin  and Zetia  due to myalgias. - Consider alternative oral medications such as Nexlizet or patient assistance for repatha . - Continue dietary modifications to manage cholesterol.  Recording duration: 26 minutes      Mumtaz Lovins, MD, FACC

## 2024-04-30 NOTE — Telephone Encounter (Signed)
 Spoke to patient about Repatha initiation. Patient will get lab done tomorrow so we can initiate PA for Repatha. Pt will need Lorrene as she can't afford co-pay.

## 2024-05-02 LAB — LIPID PANEL
Chol/HDL Ratio: 2.5 ratio (ref 0.0–4.4)
Cholesterol, Total: 213 mg/dL — ABNORMAL HIGH (ref 100–199)
HDL: 85 mg/dL (ref >39)
LDL Chol Calc (NIH): 109 mg/dL — ABNORMAL HIGH (ref 0–99)
Triglycerides: 109 mg/dL (ref 0–149)
VLDL Cholesterol Cal: 19 mg/dL (ref 5–40)

## 2024-05-05 ENCOUNTER — Other Ambulatory Visit (HOSPITAL_COMMUNITY): Payer: Self-pay

## 2024-05-05 ENCOUNTER — Ambulatory Visit: Payer: Self-pay | Admitting: Pharmacist

## 2024-05-05 ENCOUNTER — Telehealth: Payer: Self-pay | Admitting: Pharmacy Technician

## 2024-05-05 DIAGNOSIS — E7849 Other hyperlipidemia: Secondary | ICD-10-CM

## 2024-05-05 NOTE — Telephone Encounter (Signed)
 Pharmacy Patient Advocate Encounter  Received notification from [facility] that Prior Authorization for repatha has been APPROVED from 05/05/24 to 11/02/24. Ran test claim, Copay is $302.00. This test claim was processed through [facility]- copay amounts may vary at other pharmacies due to pharmacy/plan contracts, or as the patient moves through the different stages of their insurance plan.   PA #/Case ID/Reference #: [id number]    Patient Advocate Encounter   The patient was approved for a [employer] grant that will help cover the cost of repatha Total amount awarded, 2500.  Effective: 04/05/24 - 04/04/25   APW:[id number] ERW:EKKEIFP Hmnle:[id number] PI:[id number] [employer] ID: [id number]   Pharmacy provided with approval and processing information. Patient informed via mychart   Information  given to [facility]

## 2024-05-05 NOTE — Telephone Encounter (Signed)
 Patient Advocate Encounter   The patient was approved for a [employer] grant that will help cover the cost of repatha Total amount awarded, 2500.  Effective: 04/05/24 - 04/04/25   APW:[id number] ERW:EKKEIFP Hmnle:[id number] PI:[id number] [employer] ID: [id number]   Pharmacy provided with approval and processing information. Patient informed via mychart   Information  given to [employer]

## 2024-05-05 NOTE — Telephone Encounter (Signed)
   Pharmacy Patient Advocate Encounter   Received notification from Pt Calls Messages that prior authorization for REPATHA is required/requested.   Insurance verification completed.   The patient is insured through [facility].   Per test claim: PA required; PA submitted to above mentioned insurance via Latent Key/confirmation #/EOC [id number] Status is pending

## 2024-05-08 MED ORDER — REPATHA SURECLICK 140 MG/ML ~~LOC~~ SOAJ: 140.0000 mg | SUBCUTANEOUS | 3 refills | Status: AC

## 2024-05-08 NOTE — Telephone Encounter (Signed)
 Prescription sent to preferred pharmacy. Patient made aware of grant and PA approval. F/u lab due Aug 10 2024

## 2024-05-08 NOTE — Addendum Note (Signed)
 Addended by: Ulysees Robarts K on: 05/08/2024 04:38 PM   Modules accepted: Orders

## 2024-05-11 ENCOUNTER — Telehealth: Payer: Self-pay | Admitting: Internal Medicine

## 2024-05-11 NOTE — Telephone Encounter (Signed)
 RN spoke to pharmacy  Tech    The [employer] was not ran for the Rx.  The information given for grant .  Medication is now Zero cost

## 2024-05-11 NOTE — Telephone Encounter (Signed)
  Pt c/o medication issue:  1. Name of Medication: Evolocumab  (REPATHA  SURECLICK) 140 MG/ML SOAJ   2. How are you currently taking this medication (dosage and times per day)?   3. Are you having a reaction (difficulty breathing--STAT)?   4. What is your medication issue?  Patient states that she was unable to pick up the Repatha  due to she was told it would be $350.00. She says our pharmacist told her that she had a grant. She was told to call if she had any issues being able to pick up the medication.

## 2024-05-11 NOTE — Telephone Encounter (Signed)
 Patient is aware there is no cost to the medication . She states she will go back and pick the medication.  RN explained  how the [employer] -It is good until  03/2025-- $2500.

## 2024-05-11 NOTE — Telephone Encounter (Signed)
 Spoke to patient . Patient states the pharmacy told her the price of medications  RN asked did she ask pharmacy about her grant. She stated no  RN informed patient will contact pharmacy .

## 2024-05-13 NOTE — Telephone Encounter (Signed)
 Pt would like to know if she is to continue taking   atorvastatin  (LIPITOR) 20 MG tablet  Along with Repatha . Please advise.

## 2024-05-13 NOTE — Telephone Encounter (Signed)
 Yes, patient should be on both atorvastatin  and Repatha . Very safe to take together.

## 2024-07-02 LAB — LAB REPORT - SCANNED
EGFR: 93
TSH: 0.74 (ref 0.41–5.90)

## 2024-07-10 ENCOUNTER — Telehealth: Payer: Self-pay | Admitting: Internal Medicine

## 2024-07-10 NOTE — Telephone Encounter (Signed)
 Pt c/o medication issue:  1. Name of Medication: Evolocumab  (REPATHA  SURECLICK) 140 MG/ML SOAJ   2. How are you currently taking this medication (dosage and times per day)?    3. Are you having a reaction (difficulty breathing--STAT)? no  4. What is your medication issue? Patient calling to see if she needs lab work after her las injection. Please advise

## 2024-07-10 NOTE — Telephone Encounter (Signed)
 Spoke with pt. Told pt I did not see labs ordered but I could check on that. Pt stated she had labs done at [facility] and she will get them to fax them over in case.

## 2024-07-13 NOTE — Telephone Encounter (Signed)
 Lipid lab discussed LDLc at goal continue taking Repatha  and Lipitor 20 mg 3 times per week.

## 2024-07-14 ENCOUNTER — Ambulatory Visit: Payer: Self-pay | Admitting: Internal Medicine

## 2024-10-30 ENCOUNTER — Ambulatory Visit: Admitting: Internal Medicine
